# Patient Record
Sex: Male | Born: 1950 | Race: White | Hispanic: No | Marital: Married | Smoking: Former smoker
Health system: Southern US, Community
[De-identification: ages and names within clinical notes are randomized; demographics above are authoritative.]

## PROBLEM LIST (undated history)

## (undated) DIAGNOSIS — K08109 Complete loss of teeth, unspecified cause, unspecified class: Secondary | ICD-10-CM

## (undated) DIAGNOSIS — K8020 Calculus of gallbladder without cholecystitis without obstruction: Secondary | ICD-10-CM

## (undated) DIAGNOSIS — R0600 Dyspnea, unspecified: Secondary | ICD-10-CM

## (undated) DIAGNOSIS — R0689 Other abnormalities of breathing: Secondary | ICD-10-CM

## (undated) DIAGNOSIS — R053 Chronic cough: Secondary | ICD-10-CM

## (undated) DIAGNOSIS — Z973 Presence of spectacles and contact lenses: Secondary | ICD-10-CM

## (undated) DIAGNOSIS — E785 Hyperlipidemia, unspecified: Secondary | ICD-10-CM

## (undated) DIAGNOSIS — G4733 Obstructive sleep apnea (adult) (pediatric): Secondary | ICD-10-CM

## (undated) DIAGNOSIS — Z8616 Personal history of COVID-19: Secondary | ICD-10-CM

## (undated) DIAGNOSIS — J449 Chronic obstructive pulmonary disease, unspecified: Secondary | ICD-10-CM

## (undated) DIAGNOSIS — J849 Interstitial pulmonary disease, unspecified: Secondary | ICD-10-CM

## (undated) DIAGNOSIS — I10 Essential (primary) hypertension: Secondary | ICD-10-CM

## (undated) DIAGNOSIS — R911 Solitary pulmonary nodule: Secondary | ICD-10-CM

## (undated) DIAGNOSIS — E119 Type 2 diabetes mellitus without complications: Secondary | ICD-10-CM

## (undated) DIAGNOSIS — K219 Gastro-esophageal reflux disease without esophagitis: Secondary | ICD-10-CM

## (undated) DIAGNOSIS — R011 Cardiac murmur, unspecified: Secondary | ICD-10-CM

## (undated) DIAGNOSIS — G629 Polyneuropathy, unspecified: Secondary | ICD-10-CM

## (undated) DIAGNOSIS — M1990 Unspecified osteoarthritis, unspecified site: Secondary | ICD-10-CM

## (undated) DIAGNOSIS — R05 Cough: Secondary | ICD-10-CM

## (undated) DIAGNOSIS — K449 Diaphragmatic hernia without obstruction or gangrene: Secondary | ICD-10-CM

## (undated) HISTORY — PX: LAPAROSCOPIC ASSISTED VENTRAL HERNIA REPAIR: SHX6312

## (undated) HISTORY — PX: COLONOSCOPY: SHX174

## (undated) HISTORY — DX: Hyperlipidemia, unspecified: E78.5

## (undated) HISTORY — DX: Type 2 diabetes mellitus without complications: E11.9

## (undated) HISTORY — PX: TONSILLECTOMY: SUR1361

## (undated) HISTORY — DX: Essential (primary) hypertension: I10

## (undated) HISTORY — PX: VASECTOMY: SHX75

## (undated) HISTORY — PX: UMBILICAL HERNIA REPAIR: SHX196

## (undated) HISTORY — PX: HERNIA REPAIR: SHX51

---
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
## 2000-10-06 ENCOUNTER — Encounter: Admission: RE | Admit: 2000-10-06 | Discharge: 2000-10-06 | Payer: Self-pay | Admitting: Family Medicine

## 2004-09-26 ENCOUNTER — Ambulatory Visit (HOSPITAL_COMMUNITY): Admission: RE | Admit: 2004-09-26 | Discharge: 2004-09-26 | Payer: Self-pay | Admitting: General Surgery

## 2006-09-28 ENCOUNTER — Inpatient Hospital Stay (HOSPITAL_COMMUNITY): Admission: EM | Admit: 2006-09-28 | Discharge: 2006-10-01 | Payer: Self-pay | Admitting: *Deleted

## 2006-12-07 ENCOUNTER — Emergency Department (HOSPITAL_COMMUNITY): Admission: EM | Admit: 2006-12-07 | Discharge: 2006-12-07 | Payer: Self-pay | Admitting: Emergency Medicine

## 2008-04-09 ENCOUNTER — Encounter: Admission: RE | Admit: 2008-04-09 | Discharge: 2008-04-09 | Payer: Self-pay | Admitting: Family Medicine

## 2008-08-28 ENCOUNTER — Emergency Department (HOSPITAL_COMMUNITY): Admission: EM | Admit: 2008-08-28 | Discharge: 2008-08-28 | Payer: Self-pay | Admitting: Emergency Medicine

## 2008-12-06 ENCOUNTER — Inpatient Hospital Stay (HOSPITAL_COMMUNITY): Admission: RE | Admit: 2008-12-06 | Discharge: 2008-12-09 | Payer: Self-pay | Admitting: General Surgery

## 2008-12-12 ENCOUNTER — Encounter: Payer: Self-pay | Admitting: Emergency Medicine

## 2008-12-12 ENCOUNTER — Inpatient Hospital Stay (HOSPITAL_COMMUNITY): Admission: EM | Admit: 2008-12-12 | Discharge: 2008-12-17 | Payer: Self-pay | Admitting: General Surgery

## 2008-12-13 ENCOUNTER — Ambulatory Visit: Payer: Self-pay | Admitting: Vascular Surgery

## 2008-12-13 ENCOUNTER — Encounter (INDEPENDENT_AMBULATORY_CARE_PROVIDER_SITE_OTHER): Payer: Self-pay | Admitting: General Surgery

## 2009-02-12 ENCOUNTER — Encounter: Admission: RE | Admit: 2009-02-12 | Discharge: 2009-02-12 | Payer: Self-pay | Admitting: General Surgery

## 2010-04-08 LAB — CBC
HCT: 36.2 % — ABNORMAL LOW (ref 39.0–52.0)
HCT: 37.1 % — ABNORMAL LOW (ref 39.0–52.0)
Hemoglobin: 12.3 g/dL — ABNORMAL LOW (ref 13.0–17.0)
MCHC: 32.9 g/dL (ref 30.0–36.0)
MCHC: 33.6 g/dL (ref 30.0–36.0)
MCHC: 34 g/dL (ref 30.0–36.0)
MCV: 85.7 fL (ref 78.0–100.0)
MCV: 85.9 fL (ref 78.0–100.0)
Platelets: 306 10*3/uL (ref 150–400)
Platelets: 329 10*3/uL (ref 150–400)
Platelets: 350 10*3/uL (ref 150–400)
RBC: 4.36 MIL/uL (ref 4.22–5.81)
RBC: 4.38 MIL/uL (ref 4.22–5.81)
RDW: 15.4 % (ref 11.5–15.5)
RDW: 15.6 % — ABNORMAL HIGH (ref 11.5–15.5)
WBC: 11 10*3/uL — ABNORMAL HIGH (ref 4.0–10.5)
WBC: 8.8 10*3/uL (ref 4.0–10.5)

## 2010-04-08 LAB — COMPREHENSIVE METABOLIC PANEL
Albumin: 2.6 g/dL — ABNORMAL LOW (ref 3.5–5.2)
Alkaline Phosphatase: 35 U/L — ABNORMAL LOW (ref 39–117)
Alkaline Phosphatase: 37 U/L — ABNORMAL LOW (ref 39–117)
BUN: 8 mg/dL (ref 6–23)
CO2: 28 mEq/L (ref 19–32)
Calcium: 8.1 mg/dL — ABNORMAL LOW (ref 8.4–10.5)
Calcium: 8.1 mg/dL — ABNORMAL LOW (ref 8.4–10.5)
Chloride: 101 mEq/L (ref 96–112)
Creatinine, Ser: 0.79 mg/dL (ref 0.4–1.5)
Creatinine, Ser: 0.84 mg/dL (ref 0.4–1.5)
GFR calc Af Amer: >60 mL/min (ref >60)
GFR calc non Af Amer: >60 mL/min (ref >60)
Glucose, Bld: 108 mg/dL — ABNORMAL HIGH (ref 70–99)
Potassium: 3.8 mEq/L (ref 3.5–5.1)
Sodium: 137 mEq/L (ref 135–145)

## 2010-04-08 LAB — BASIC METABOLIC PANEL
Creatinine, Ser: 0.78 mg/dL (ref 0.4–1.5)
GFR calc Af Amer: >60 mL/min (ref >60)
Glucose, Bld: 134 mg/dL — ABNORMAL HIGH (ref 70–99)

## 2010-04-08 LAB — DIFFERENTIAL
Basophils Absolute: 0 10*3/uL (ref 0.0–0.1)
Basophils Relative: 0 % (ref 0–1)
Lymphs Abs: 1.7 10*3/uL (ref 0.7–4.0)
Monocytes Absolute: 0.7 10*3/uL (ref 0.1–1.0)
Monocytes Relative: 7 % (ref 3–12)

## 2010-04-09 LAB — COMPREHENSIVE METABOLIC PANEL
ALT: 27 U/L (ref 0–53)
AST: 25 U/L (ref 0–37)
Alkaline Phosphatase: 43 U/L (ref 39–117)
Chloride: 108 mEq/L (ref 96–112)
GFR calc Af Amer: >60 mL/min (ref >60)
Glucose, Bld: 145 mg/dL — ABNORMAL HIGH (ref 70–99)
Total Protein: 6.6 g/dL (ref 6.0–8.3)

## 2010-04-09 LAB — DIFFERENTIAL
Basophils Absolute: 0.1 10*3/uL (ref 0.0–0.1)
Basophils Relative: 1 % (ref 0–1)
Eosinophils Absolute: 0.3 10*3/uL (ref 0.0–0.7)
Eosinophils Relative: 5 % (ref 0–5)
Lymphocytes Relative: 30 % (ref 12–46)
Lymphs Abs: 2 10*3/uL (ref 0.7–4.0)
Monocytes Absolute: 0.6 10*3/uL (ref 0.1–1.0)
Monocytes Relative: 9 % (ref 3–12)
Neutro Abs: 3.7 10*3/uL (ref 1.7–7.7)
Neutrophils Relative %: 55 % (ref 43–77)

## 2010-04-09 LAB — CBC
HCT: 37.7 % — ABNORMAL LOW (ref 39.0–52.0)
Hemoglobin: 12.8 g/dL — ABNORMAL LOW (ref 13.0–17.0)
MCHC: 33.8 g/dL (ref 30.0–36.0)
MCV: 84.9 fL (ref 78.0–100.0)
Platelets: 253 10*3/uL (ref 150–400)
RBC: 4.44 MIL/uL (ref 4.22–5.81)
RDW: 15.6 % — ABNORMAL HIGH (ref 11.5–15.5)
WBC: 6.8 10*3/uL (ref 4.0–10.5)

## 2010-04-12 LAB — DIFFERENTIAL
Basophils Relative: 1 % (ref 0–1)
Eosinophils Absolute: 0.4 10*3/uL (ref 0.0–0.7)
Monocytes Absolute: 1 10*3/uL (ref 0.1–1.0)
Monocytes Relative: 9 % (ref 3–12)
Neutrophils Relative %: 63 % (ref 43–77)

## 2010-04-12 LAB — COMPREHENSIVE METABOLIC PANEL
ALT: 23 U/L (ref 0–53)
Alkaline Phosphatase: 47 U/L (ref 39–117)
Glucose, Bld: 97 mg/dL (ref 70–99)
Potassium: 3.9 mEq/L (ref 3.5–5.1)
Sodium: 139 mEq/L (ref 135–145)
Total Protein: 6.9 g/dL (ref 6.0–8.3)

## 2010-04-12 LAB — URINALYSIS, ROUTINE W REFLEX MICROSCOPIC
Glucose, UA: NEGATIVE mg/dL
Ketones, ur: NEGATIVE mg/dL
Nitrite: NEGATIVE
Urobilinogen, UA: 0.2 mg/dL (ref 0.0–1.0)
pH: 5.5 (ref 5.0–8.0)

## 2010-04-12 LAB — CBC
Hemoglobin: 13.5 g/dL (ref 13.0–17.0)
RBC: 4.71 MIL/uL (ref 4.22–5.81)
RDW: 15.2 % (ref 11.5–15.5)

## 2010-04-12 LAB — URINE MICROSCOPIC-ADD ON

## 2010-05-20 NOTE — Op Note (Signed)
[name] [name], ENGEN             ACCOUNT NO.:  [id number]   MEDICAL RECORD NO.:  [id number]          PATIENT TYPE:  INP   LOCATION:  0098                         FACILITY:  [facility]   PHYSICIAN:  Sandria Bales. Ezzard Standing, M.D.  DATE OF BIRTH:  09/08/1950   DATE OF PROCEDURE:  09/28/2006  DATE OF DISCHARGE:                               OPERATIVE REPORT   PREOPERATIVE DIAGNOSIS:  Incarcerated ventral hernia.   POSTOPERATIVE DIAGNOSIS:  Incarcerated recurrent ventral hernia with  approximate 6-cm defect.   PROCEDURE:  Laparoscopic ventral hernia repair with a 20 x 15 cm  Parietex mesh.   SURGEON:  Sandria Bales. Ezzard Standing, M.D.   ANESTHESIA:  General endotracheal with about 20 mL of local 0.25%  Marcaine.   COMPLICATIONS:  None.   INDICATIONS FOR PROCEDURE:  Mr. Swingle  is a morbidly obese 60-year-  old white male who is a patient of Dr. Alwyn Pea, who has an  estimated BMI of 62 to 63.  He had a umbilical hernia repaired by Dr. Megan Mans in September 2006.  Over the last three to four months he has had  some vague abdominal pain but then presented today acutely with  abdominal pain, has what appears to be incarcerated ventral hernia.   I discussed with the patient and his wife the indications and potential  complications of surgery.  Potential complications include, but not  limited to, bleeding, infection, recurrence of the hernia, bowel  resection and I stressed to him that is important that he lose weight.  If he continues to have the weight that he has, he has a very high risk  of this hernia recurring again, no matter how it was repaired.   OPERATIVE NOTE:  The patient placed in a supine position, given a  general anesthetic.  He had a gram of Ancef at initiation of the  procedure.  He had PAS stockings placed and Foley catheter in place and  a time-out was done again identifying the patient and the procedure.   I accessed the abdominal cavity with a 10-mm Ethicon OptiVu in the  left  upper quadrant.  I placed three additional 5 mm trocars; a 5-mm left  lower quadrant, 5 mm left subcostal and 5 mm in the right lateral  abdomen.   I then was able to get up and identify the hernia which I was able to  reduce.  He had a wad of omentum incarecerated in the hernia.  I used  the Harmonic scalpel to take the omentum down.   I then found the defect.  I used a spinal needle to outline the edge of  the defect which about 6 cm in diameter and round.  I could see the  bottom lip which looked like the mesh and repair that Dr. Lindie Spruce had  done.   I then got a 15 x 20 cm piece of Parietex and placed eight sutures  around the edge of the Parietex and inserted this into abdominal cavity  and then brought this up to the anterior abdominal wall by making  puncture wounds and a clockwise fashion  to pull out the eight sutures.  I then used a tacker which I used about approximately 40 tacks to tack  this up to the anterior abdominal wall.  The mesh lay flat covered  the  abdominal hernia well, I thought I had at least 3-4 cm of purchase  around the edges of the mesh.   I did have one suture that may have broken at the 1:30 position of the  mesh and I used a #1 Novofil sutures through the mesh to retrieve and  grab that.   The mesh was then looked like it lay flat.  I took photos which I put in  the chart.  I removed trocars, I closed the trocar sites with a 5-0  Monocryl suture.  I painted each wound with tincture of benzoin and  steri-stripped.  The patient procedure well, was transported to recovery  room in good condition.  Sponge and needle count correct at end of the  case.      Sandria Bales. Ezzard Standing, M.D.  Electronically Signed     DHN/MEDQ  D:  09/28/2006  T:  09/29/2006  Job:  [id number]   cc:   Tanya D. Daphine Deutscher, M.D.  Fax: [phone]   Cherylynn Ridges, M.D.  [address], [address]  [place]  [place] [address]

## 2010-05-20 NOTE — H&P (Signed)
[name], [name]             ACCOUNT NO.:  [id number]   MEDICAL RECORD NO.:  [id number]          PATIENT TYPE:  EMS   LOCATION:  ED                           FACILITY:  [facility]   PHYSICIAN:  Sandria Bales. Ezzard Standing, M.D.  DATE OF BIRTH:  [birth year]   DATE OF ADMISSION:  09/28/2006  DATE OF DISCHARGE:                              HISTORY & PHYSICAL   HISTORY OF PRESENT ILLNESS:  This is a 60 year old white male who is a  patient of Dr. Alwyn [name],  who on September 26, 2004, two years ago,  underwent repair of a periumbilical ventral hernia with mesh and had a  partial omentectomy.  This was an elective operation.  Dr. Lindie [name] did the  surgery and found the an approximately 3.5 to 4 centimeter opening of  the abdominal fascia.  He repaired it with interrupted #1 Novofil and  then over laid a piece of mesh which measured 3 x 5 centimeters  attaching it with #1 Novofil.   The patient had done well until maybe two to three months ago when he  said he had noticed type of pain or discomfort in his epigastrium  however, he did not see Dr. Lindie [name] back nor reviewed this with any  medical physician.  At about 10 o'clock this morning he had a sort of  sudden onset of abdominal pain.  I think he saw Dr. Daphine [name] in her office  though I have not spoken to Dr. Daphine [name].  He was sent to the [facility]  Emergency Room.   He has had no prior history of peptic ulcer disease, liver disease,  gallbladder disease, colon disease or bladder disease.  He however is  morbidly obese.  He estimates his weight at 325 pounds.  His height is 5  feet, 10 inches and this makes a calculated BMI of about 62.   PAST MEDICAL HISTORY:  He has no allergies. The only medicine he takes  is multivitamins.   REVIEW OF SYSTEMS:  Neurologic: No seizures or loss of consciousness.  Pulmonary: He quit smoking two or three years ago.  Cardiac: He has no heart disease, chest pain or hypertension.  He has  never had a cardiac  catheterization.  Gastrointestinal:  See history of present illness.  Urologic: No history of kidney stones or kidney infections.   He is accompanied by his wife in the emergency room.  He has been  unemployed for a year;  before that he worked in [employer].   PHYSICAL EXAMINATION:  His temperature is 97.3, his blood pressure  154/96, pulse 76, respirations 20.  He is a well-nourished morbidly obese white male alert and cooperative  on physical examination.  HEENT:  Unremarkable.  He has a stout neck but I feel no thyromegaly or  mass.  His heart has a regular rate and rhythm.  I do not hear a murmur.  ABDOMEN: he has an approximate 10-12 centimeter mass consistent with an  incarcerated abdominal wall hernia.  He is a very large man which makes  it reducing almost impractical.  He has no other localized tenderness  just tenderness over the hernia itself.  I feel no other mass, though  his size limits the physical exam.  His lungs are clear to auscultation.  His heart has a regular rate and  rhythm without murmur or rub.  EXTREMITIES:  He has good strength in all four extremities.  NEUROLOGICALLY:  Grossly intact.   I have no labs at the time of dictation.   IMPRESSION:  1. Recurrent ventral hernia.  I think it is incarcerated and there is      not going to be a practical way to reduce this outside of general      anesthesia in surgery.  I discussed with him hernia repair as he      has already been through.  We talked about putting mesh in and the      possibility that he has got an infection, I can not put mesh in.      The risks for surgery include, but are not limited to, bleeding,      infection, recurrence of a hernia.  He understands well that because of his weight he is at very high risk  for recurrent hernias if he does not lose a substantial amount of weight  and I went over this with him.  He said he and his wife are trying to do  things to lose weight.  1.  Morbid obesity with approximately a BMI of 62 to 63.  2. History of smoking but he quit about three years ago.      Sandria Bales. Ezzard Standing, M.D.  Electronically Signed     DHN/MEDQ  D:  09/28/2006  T:  09/28/2006  Job:  [id number]   cc:   Tanya D. [name] [name], M.D.  Fax: [phone]   Cherylynn Ridges, M.D.  [address], [address]  [place]  [place] [address]

## 2010-05-23 NOTE — Discharge Summary (Signed)
[name] [name], [name] [name]             ACCOUNT NO.:  [id number]   MEDICAL RECORD NO.:  [id number]          PATIENT TYPE:  INP   LOCATION:  1337                         FACILITY:  [facility]   PHYSICIAN:  Sandria Bales. Ezzard Standing, M.D.  DATE OF BIRTH:  [birth year]   DATE OF ADMISSION:  09/28/2006  DATE OF DISCHARGE:  10/01/2006                               DISCHARGE SUMMARY   DISCHARGE DIAGNOSES:  1. Incarcerated abdominal wall hernia.  2. Smoking.  3. He has morbid obesity with a BMI approximately 62 to 63.   OPERATION:  Laparoscopic repair of incarcerated ventral hernia with Parietex mesh   HISTORY OF ILLNESS:  This is a 60 year old white male who is a patient  of Dr. Alwyn Pea had undergone an open abdominal wall repair of a  hernia by Dr. Frederik Schmidt in September 2006.  He had on and off abdominal  pain for 2 or 3 months before presently acutely when he began this  morning with fairly severe pain he came to the [facility] Emergency  Room.  He was identified as having an incarcerated abdominal hernia.  I  discussed with him about repairing this hernia at this time.   Of note, he is significantly overweight with a BMI 62.  He says to me  his weight is 325.  His height is 5 feet 10 inches which makes him a  calculated BMI of about 62.  He understands well that his weight is a  predominant factor in his recurrent hernia and even this repair may not  hold up if he continues to maintain or gain weight from where he is  right now.  He also smokes cigarettes.  I told him this is bad for  health.   On the day of admission, he was sent to the operating room where he  underwent a laparoscopic repair of an incarcerated ventral hernia.  I  used a piece of 20 x 15 Parietex mesh.   Postoperatively, he did well.  His temperature was 97.3 the 1st  postoperative day.  Pulse 76.  His abdomen was quiet.  By the 2nd postoperative day, he started  increasing diet.  Upon the 3rd day, he was doing well enough  to be fairly comfortable and  be ready for discharge.   DISCHARGE INSTRUCTIONS:  Included a regular diet.  He could shower.  On discharge, he is to wear an abdominal binder for 1 month while he is  up and moving.  He should not drive for 3 or 4 days.  He is to see me back in 2 to 3 weeks.  He was given Vicodin for pain and he knew to call for interval problem.      Sandria Bales. Ezzard Standing, M.D.  Electronically Signed     DHN/MEDQ  D:  10/27/2006  T:  10/28/2006  Job:  [id number]   cc:   Tanya D. Daphine Deutscher, M.D.  Fax: [phone]

## 2010-05-23 NOTE — Op Note (Signed)
[name] [name], [name] [name]             ACCOUNT NO.:  [id number]   MEDICAL RECORD NO.:  [id number]          PATIENT TYPE:  AMB   LOCATION:  SDS                          FACILITY:  [facility]   PHYSICIAN:  Cherylynn Ridges, M.D.    DATE OF BIRTH:  [birth year]   DATE OF PROCEDURE:  09/26/2004  DATE OF DISCHARGE:                                 OPERATIVE REPORT   PREOPERATIVE DIAGNOSIS:  Periumbilical ventral hernia.   POSTOPERATIVE DIAGNOSIS:  Periumbilical ventral hernia with incarcerated  omentum.   PROCEDURE:  1.  Repair of periumbilical ventral hernia with mesh.  2.  Partial omentectomy.   SURGEON:  Jimmye Norman, M.D.   ANESTHESIA:  General endotracheal anesthesia.   ESTIMATED BLOOD LOSS:  Less than 20 mL.   COMPLICATIONS:  None.   CONDITION:  Good.   INDICATIONS FOR PROCEDURE:  The patient is a 60 year old gentleman with  symptomatic periumbilical ventral hernia who comes in now for repair.   FINDINGS:  The patient had a large amount of incarcerated omentum which was  more easily resected than placed back into the peritoneal cavity.  Part of  it did get traumatized from being incarcerated in the hernia.   OPERATION:  The patient was taken to the operating room and placed on the  table in supine position.  After an adequate endotracheal anesthetic was  administered, he was prepped and draped in the usual sterile manner exposing  the midline and the periumbilical area.  We made an infraumbilical  curvilinear incision approximately 8 cm long below the umbilicus.  It was  taken down into the subcu where we immediately came across the hernia sac.  Using appropriate retraction, we dissected out the hernia sac to its fascial  edges.  At that point, we did open the hernia sac and found there to be  incarcerated omentum.  We partially resected some of this omentum, although  we were able to remove it from the hernia sac and allow it to fall back into  the peritoneal cavity.  We ligated the  omental vessels using 2-0 Vicryl  ties.  Once the omentum had fallen back into the peritoneal cavity, we were  able to see the fascial edges appropriately.  The opening was about 3.5 to 4  cm in size.  We resected the hernia sac completely and then we repaired the  hernia.  The initial repair was with interrupted simple stitches of #1  Novofil.  Approximately nine of these were used to close the defect.  We  then overlaid a piece of mesh measuring approximately 5 by 3 cm in size  overlying it over the primary repair and attaching it with the #1 Novofil  suture.  We soaked the mesh with antibiotic solution and then irrigated with  antibiotic solution prior to closure.   We closed in multiple layers, deep 2-0 Vicryl layer directly on top of the  mesh repair.  We then did a subcuticular stitch of 3-0 Vicryl inverting the  periumbilical area which had been concave prior to the procedure.  Then, we  closed the skin using a running  subcuticular stitch of 4-0 Monopril.  We  injected the incisional area with 0.5% Marcaine without epinephrine prior to  closure.  A sterile dressing was applied.  All counts were correct.      Cherylynn Ridges, M.D.  Electronically Signed     JOW/MEDQ  D:  09/26/2004  T:  09/26/2004  Job:  [id number]   cc:   Tanya D. Daphine Deutscher, M.D.  Fax: [phone]

## 2010-10-16 LAB — COMPREHENSIVE METABOLIC PANEL
AST: 24
Albumin: 3.6
Calcium: 9.2
Creatinine, Ser: 0.95
GFR calc Af Amer: >60

## 2010-10-16 LAB — CBC
MCHC: 34.7
MCV: 82.7
Platelets: 384
RDW: 15 — ABNORMAL HIGH

## 2012-11-29 DIAGNOSIS — M720 Palmar fascial fibromatosis [Dupuytren]: Secondary | ICD-10-CM | POA: Insufficient documentation

## 2012-12-16 DIAGNOSIS — E785 Hyperlipidemia, unspecified: Secondary | ICD-10-CM | POA: Insufficient documentation

## 2013-02-16 DIAGNOSIS — I10 Essential (primary) hypertension: Secondary | ICD-10-CM | POA: Insufficient documentation

## 2013-02-24 DIAGNOSIS — Z87891 Personal history of nicotine dependence: Secondary | ICD-10-CM | POA: Insufficient documentation

## 2013-10-27 DIAGNOSIS — E119 Type 2 diabetes mellitus without complications: Secondary | ICD-10-CM | POA: Insufficient documentation

## 2015-02-14 ENCOUNTER — Encounter: Payer: Self-pay | Admitting: Gastroenterology

## 2015-02-14 ENCOUNTER — Telehealth: Payer: Self-pay

## 2015-02-14 NOTE — Telephone Encounter (Signed)
Pt has a BMI of 51.54. Do you want pt to have a direct hospital colonoscopy or an OV prior to colonoscopy? Thanks Daisi Kentner-PV

## 2015-02-15 NOTE — Telephone Encounter (Signed)
Called home number. Instructed by gentleman this is the incorrect number for this patient. Called mobile number, it states the mail box is not set up and you cannot leave a message .  Called work number and was [employer] and was told this man has retired and no longer works there.   Lelan Pons PV

## 2015-02-15 NOTE — Telephone Encounter (Signed)
There is no clinical info in Epic, so it would be best to bring this patient in for a visit with the APP to discuss CRC screening options.

## 2015-02-15 NOTE — Telephone Encounter (Signed)
Attempted call 445 pm. Would not let me LM as stated below. Lelan Pons PV

## 2015-02-19 NOTE — Telephone Encounter (Signed)
Attempted to call patient x 3. No answer the first time. Second attempt a woman answered but hung up when asked for patient. Third attempt woman answered. I identified myself and she stated that we had the wrong number.

## 2015-02-20 ENCOUNTER — Ambulatory Visit (AMBULATORY_SURGERY_CENTER): Payer: Self-pay | Admitting: *Deleted

## 2015-02-20 ENCOUNTER — Encounter: Payer: Self-pay | Admitting: Gastroenterology

## 2015-02-20 VITALS — Ht 69.5 in | Wt 347.4 lb

## 2015-02-20 DIAGNOSIS — Z1211 Encounter for screening for malignant neoplasm of colon: Secondary | ICD-10-CM

## 2015-02-20 NOTE — Progress Notes (Signed)
Patient denies any allergies to eggs or soy. Patient denies any problems with anesthesia/sedation. Patient denies any oxygen use at home and does not take any diet/weight loss medications. Per phone note by MD, Colonoscopy cancelled and office visit made on April 11 8:45 am. Pt aware.

## 2015-03-06 ENCOUNTER — Encounter: Payer: Self-pay | Admitting: Gastroenterology

## 2015-04-16 ENCOUNTER — Ambulatory Visit: Payer: 59 | Admitting: Gastroenterology

## 2015-11-27 ENCOUNTER — Encounter: Payer: Self-pay | Admitting: Gastroenterology

## 2016-01-21 ENCOUNTER — Ambulatory Visit (INDEPENDENT_AMBULATORY_CARE_PROVIDER_SITE_OTHER): Payer: Medicare Other | Admitting: Gastroenterology

## 2016-01-21 ENCOUNTER — Encounter (INDEPENDENT_AMBULATORY_CARE_PROVIDER_SITE_OTHER): Payer: Self-pay

## 2016-01-21 ENCOUNTER — Encounter: Payer: Self-pay | Admitting: Gastroenterology

## 2016-01-21 VITALS — BP 160/90 | HR 80 | Ht 68.0 in | Wt 320.5 lb

## 2016-01-21 DIAGNOSIS — Z1211 Encounter for screening for malignant neoplasm of colon: Secondary | ICD-10-CM | POA: Diagnosis not present

## 2016-01-21 MED ORDER — NA SULFATE-K SULFATE-MG SULF 17.5-3.13-1.6 GM/177ML PO SOLN: 1.0000 | Freq: Once | ORAL | 0 refills | Status: AC

## 2016-01-21 NOTE — Progress Notes (Signed)
[facility] Consult Note:  History: [name] [name] 01/21/2016  Referring physician: Berkley Harvey, NP  Reason for consult/chief complaint: Colon Cancer Screening   Subjective  HPI:  Reports last colonoscopy 12-15 yrs ago. Denies abd pain, rectal bleeding or change in bowel habits. Brought for OV b/c BMI.   ROS:  Review of Systems  Denies chest pain or dyspnea Hast purposely brought down weight 27 pounds in last year. BMI now 48.7  Past Medical History: Past Medical History:  Diagnosis Date  . Diabetes mellitus without complication ([place])   . Hyperlipidemia   . Hypertension      Past Surgical History: Past Surgical History:  Procedure Laterality Date  . COLONOSCOPY  at age [age] yrs ago   pt cannot remember place. exam normal per pt.   Marland Kitchen HERNIA REPAIR    . TONSILLECTOMY    . UMBILICAL HERNIA REPAIR    . VASECTOMY       Family History: Family History  Problem Relation Age of Onset  . Cancer Mother     ? type  . Heart disease Father   . Heart disease Brother   . Colon cancer Neg Hx     Social History: Social History   Social History  . Marital status: Married    Spouse name: N/A  . Number of children: 2  . Years of education: N/A   Occupational History  . retired    Social History Main Topics  . Smoking status: Former Smoker    Quit date: 01/05/2002  . Smokeless tobacco: Never Used  . Alcohol use No  . Drug use: No  . Sexual activity: Not Asked   Other Topics Concern  . None   Social History Narrative  . None    Allergies: Allergies  Allergen Reactions  . Mercury Swelling    Outpatient Meds: Current Outpatient Prescriptions  Medication Sig Dispense Refill  . albuterol (PROVENTIL HFA;VENTOLIN HFA) 108 (90 Base) MCG/ACT inhaler Inhale 1 puff into the lungs as needed.    Marland Kitchen aspirin EC 81 MG tablet Take 1 tablet by mouth daily.    Marland Kitchen atorvastatin (LIPITOR) 20 MG tablet Take 20 mg by mouth daily.    . calcium-vitamin  D (CALCIUM 500/D) 500-200 MG-UNIT tablet Take 1 tablet by mouth daily.    . Lancets (ACCU-CHEK MULTICLIX) lancets     . losartan-hydrochlorothiazide (HYZAAR) 50-12.5 MG tablet Take 1 tablet by mouth daily.    . metFORMIN (GLUCOPHAGE) 500 MG tablet Take 1 tablet by mouth daily.    . Multiple Vitamin (MULTI-VITAMINS) TABS Take 1 tablet by mouth daily.    . Omega-3 Fatty Acids (SM FISH OIL) 1000 MG CAPS Take 1 capsule by mouth 2 (two) times daily.     No current facility-administered medications for this visit.       ___________________________________________________________________ Objective   Exam:  BP (!) 160/90 (BP Location: Left Arm, Patient Position: Sitting, Cuff Size: Large)   Pulse 80   Ht 5\' 8"  (1.727 m) Comment: height measured without shoes  Wt (!) 320 lb 8 oz (145.4 kg)   BMI 48.73 kg/m    General: this is a(n) morbidly obese man, no acute distress   Eyes: sclera anicteric, no redness  ENT: oral mucosa moist without lesions, no cervical or supraclavicular lymphadenopathy, good dentition  CV: RRR without murmur, S1/S2, no JVD, no peripheral edema  Resp: clear to auscultation bilaterally, normal RR and effort noted  GI: soft, no tenderness, with active bowel sounds.  No guarding or palpable organomegaly noted.  Skin; warm and dry, no rash or jaundice noted  Neuro: awake, alert and oriented x 3. Normal gross motor function and fluent speech  Labs:  none  Radiologic Studies:  none  Assessment: Encounter Diagnoses  Name Primary?  . Special screening for malignant neoplasms, colon Yes  . Morbid obesity ([place])     Screening colon in [place].  Thank you for the courtesy of this consult.  Please call me with any questions or concerns.  Nelida Meuse III  CC: Berkley Harvey, NP

## 2016-01-21 NOTE — Patient Instructions (Signed)
If you are age 66 or older, your body mass index should be between 23-30. Your Body mass index is 48.73 kg/m. If this is out of the aforementioned range listed, please consider follow up with your Primary Care Provider.  If you are age 100 or younger, your body mass index should be between 19-25. Your Body mass index is 48.73 kg/m. If this is out of the aformentioned range listed, please consider follow up with your Primary Care Provider.   You have been scheduled for a colonoscopy. Please follow written instructions given to you at your visit today.  Please pick up your prep supplies at the pharmacy within the next 1-3 days. If you use inhalers (even only as needed), please bring them with you on the day of your procedure. Your physician has requested that you go to www.startemmi.com and enter the access code given to you at your visit today. This web site gives a general overview about your procedure. However, you should still follow specific instructions given to you by our office regarding your preparation for the procedure.  Thank you for choosing [facility]  Dr Wilfrid Lund III

## 2016-02-07 ENCOUNTER — Ambulatory Visit (AMBULATORY_SURGERY_CENTER): Payer: Medicare Other | Admitting: Gastroenterology

## 2016-02-07 ENCOUNTER — Encounter: Payer: Self-pay | Admitting: Gastroenterology

## 2016-02-07 VITALS — BP 113/72 | HR 67 | Temp 98.0°F | Resp 16 | Ht 68.0 in | Wt 320.0 lb

## 2016-02-07 DIAGNOSIS — Z1212 Encounter for screening for malignant neoplasm of rectum: Secondary | ICD-10-CM | POA: Diagnosis not present

## 2016-02-07 DIAGNOSIS — Z1211 Encounter for screening for malignant neoplasm of colon: Secondary | ICD-10-CM | POA: Diagnosis not present

## 2016-02-07 DIAGNOSIS — D123 Benign neoplasm of transverse colon: Secondary | ICD-10-CM

## 2016-02-07 LAB — GLUCOSE, CAPILLARY
Glucose-Capillary: 128 mg/dL — ABNORMAL HIGH (ref 65–99)
Glucose-Capillary: 128 mg/dL — ABNORMAL HIGH (ref 65–99)

## 2016-02-07 MED ORDER — SODIUM CHLORIDE 0.9 % IV SOLN: 500.0000 mL | INTRAVENOUS | Status: DC

## 2016-02-07 NOTE — Progress Notes (Signed)
To recovery, report to Hodges, RN, VSS 

## 2016-02-07 NOTE — Progress Notes (Signed)
Called to room to assist during endoscopic procedure.  Patient ID and intended procedure confirmed with present staff. Received instructions for my participation in the procedure from the performing physician.  

## 2016-02-07 NOTE — Op Note (Signed)
[place] Patient Name: [name] [name] Procedure Date: 02/07/2016 10:10 AM MRN: [id number] Endoscopist: [place]. Loletha Carrow , MD Age: 66 Referring MD:  Date of Birth: [birth year] Gender: Male Account #: [id number] Procedure:                Colonoscopy Indications:              Screening for colorectal malignant neoplasm                            (patient reports a normal colonoscopy 15 yrs ago) Medicines:                Monitored Anesthesia Care Procedure:                Pre-Anesthesia Assessment:                           - Prior to the procedure, a History and Physical                            was performed, and patient medications and                            allergies were reviewed. The patient's tolerance of                            previous anesthesia was also reviewed. The risks                            and benefits of the procedure and the sedation                            options and risks were discussed with the patient.                            All questions were answered, and informed consent                            was obtained. Anticoagulants: The patient has taken                            aspirin. It was decided not to withhold this                            medication prior to the procedure. ASA Grade                            Assessment: II - A patient with mild systemic                            disease. After reviewing the risks and benefits,                            the patient was deemed in satisfactory condition to  undergo the procedure.                           After obtaining informed consent, the colonoscope                            was passed under direct vision. Throughout the                            procedure, the patient's blood pressure, pulse, and                            oxygen saturations were monitored continuously. The                            Model CF-HQ190L [id number]) scope was  introduced                            through the anus and advanced to the the cecum,                            identified by appendiceal orifice and ileocecal                            valve. The colonoscopy was performed without                            difficulty. The patient tolerated the procedure                            well. The quality of the bowel preparation was                            good. The ileocecal valve, appendiceal orifice, and                            rectum were photographed. The quality of the bowel                            preparation was evaluated using the BBPS [facility]                            Bowel Preparation Scale) with scores of: Right                            Colon = 2, Transverse Colon = 2 and Left Colon = 2.                            The total BBPS score equals 6. The bowel                            preparation used was SUPREP. Scope In: 10:26:53 AM Scope Out: 10:44:52 AM Scope Withdrawal Time: 0 hours 12 minutes 29 seconds  Total Procedure Duration: 0 hours 17 minutes 59 seconds  Findings:                 The perianal and digital rectal examinations were                            normal.                           A 4 mm polyp was found in the distal transverse                            colon. The polyp was semi-sessile. The polyp was                            removed with a cold snare. Resection and retrieval                            were complete.                           Internal hemorrhoids were found during retroflexion                            and during anoscopy. The hemorrhoids were small and                            Grade I (internal hemorrhoids that do not prolapse).                           The exam was otherwise without abnormality on                            direct and retroflexion views. Complications:            No immediate complications. Estimated Blood Loss:     Estimated blood loss: none. Impression:                - One 4 mm polyp in the distal transverse colon,                            removed with a cold snare. Resected and retrieved.                           - Internal hemorrhoids.                           - The examination was otherwise normal on direct                            and retroflexion views. Recommendation:           - Patient has a contact number available for                            emergencies. The signs and symptoms of potential  delayed complications were discussed with the                            patient. Return to normal activities tomorrow.                            Written discharge instructions were provided to the                            patient.                           - Resume previous diet.                           - Continue present medications.                           - Await pathology results.                           - Repeat colonoscopy is recommended for                            surveillance. The colonoscopy date will be                            determined after pathology results from today's                            exam become available for review. Deven Furia L. Loletha Carrow, MD 02/07/2016 10:52:26 AM This report has been signed electronically.

## 2016-02-07 NOTE — Patient Instructions (Signed)
YOU HAD AN ENDOSCOPIC PROCEDURE TODAY AT THE [facility]:   Refer to the procedure report that was given to you for any specific questions about what was found during the examination.  If the procedure report does not answer your questions, please call your gastroenterologist to clarify.  If you requested that your care partner not be given the details of your procedure findings, then the procedure report has been included in a sealed envelope for you to review at your convenience later.  YOU SHOULD EXPECT: Some feelings of bloating in the abdomen. Passage of more gas than usual.  Walking can help get rid of the air that was put into your GI tract during the procedure and reduce the bloating. If you had a lower endoscopy (such as a colonoscopy or flexible sigmoidoscopy) you may notice spotting of blood in your stool or on the toilet paper. If you underwent a bowel prep for your procedure, you may not have a normal bowel movement for a few days.  Please Note:  You might notice some irritation and congestion in your nose or some drainage.  This is from the oxygen used during your procedure.  There is no need for concern and it should clear up in a day or so.  SYMPTOMS TO REPORT IMMEDIATELY:   Following lower endoscopy (colonoscopy or flexible sigmoidoscopy):  Excessive amounts of blood in the stool  Significant tenderness or worsening of abdominal pains  Swelling of the abdomen that is new, acute  Fever of 100F or higher   For urgent or emergent issues, a gastroenterologist can be reached at any hour by calling [phone].   DIET:  We do recommend a small meal at first, but then you may proceed to your regular diet.  Drink plenty of fluids but you should avoid alcoholic beverages for 24 hours.  ACTIVITY:  You should plan to take it easy for the rest of today and you should NOT DRIVE or use heavy machinery until tomorrow (because of the sedation medicines used during the test).     FOLLOW UP: Our staff will call the number listed on your records the next business day following your procedure to check on you and address any questions or concerns that you may have regarding the information given to you following your procedure. If we do not reach you, we will leave a message.  However, if you are feeling well and you are not experiencing any problems, there is no need to return our call.  We will assume that you have returned to your regular daily activities without incident.  If any biopsies were taken you will be contacted by phone or by letter within the next 1-3 weeks.  Please call us at [phone] if you have not heard about the biopsies in 3 weeks.    SIGNATURES/CONFIDENTIALITY: You and/or your care partner have signed paperwork which will be entered into your electronic medical record.  These signatures attest to the fact that that the information above on your After Visit Summary has been reviewed and is understood.  Full responsibility of the confidentiality of this discharge information lies with you and/or your care-partner.  Read all handouts given to you by your recovery room nurse. 

## 2016-02-10 ENCOUNTER — Telehealth: Payer: Self-pay

## 2016-02-10 ENCOUNTER — Encounter: Payer: Self-pay | Admitting: Podiatry

## 2016-02-10 ENCOUNTER — Ambulatory Visit (INDEPENDENT_AMBULATORY_CARE_PROVIDER_SITE_OTHER): Payer: Medicare Other | Admitting: Podiatry

## 2016-02-10 DIAGNOSIS — M79609 Pain in unspecified limb: Secondary | ICD-10-CM | POA: Diagnosis not present

## 2016-02-10 DIAGNOSIS — L603 Nail dystrophy: Secondary | ICD-10-CM | POA: Diagnosis not present

## 2016-02-10 DIAGNOSIS — E0843 Diabetes mellitus due to underlying condition with diabetic autonomic (poly)neuropathy: Secondary | ICD-10-CM

## 2016-02-10 DIAGNOSIS — L608 Other nail disorders: Secondary | ICD-10-CM

## 2016-02-10 DIAGNOSIS — B351 Tinea unguium: Secondary | ICD-10-CM

## 2016-02-10 NOTE — Telephone Encounter (Signed)
  Follow up Call-  Call back number 02/07/2016  Post procedure Call Back phone  # [phone]  Permission to leave phone message Yes  Some recent data might be hidden    Patient was called for follow up after his procedure on 02/07/2016. No answer at the number given for follow up phone call. A message was left on the answering machine.

## 2016-02-10 NOTE — Telephone Encounter (Signed)
  Follow up Call-  Call back number 02/07/2016  Post procedure Call Back phone  # [phone]  Permission to leave phone message Yes  Some recent data might be hidden    Patient was called for follow up after his procedure on /02/07/2016. No answer at the number given for follow up phone call. A message was left on the answering machine.

## 2016-02-12 ENCOUNTER — Encounter: Payer: Self-pay | Admitting: Gastroenterology

## 2016-02-16 NOTE — Progress Notes (Signed)
   SUBJECTIVE Patient with a history of diabetes mellitus presents to office today complaining of elongated, thickened nails. Pain while ambulating in shoes. Patient is unable to trim their own nails.   OBJECTIVE General Patient is awake, alert, and oriented x 3 and in no acute distress. Derm Skin is dry and supple bilateral. Negative open lesions or macerations. Remaining integument unremarkable. Nails are tender, long, thickened and dystrophic with subungual debris, consistent with onychomycosis, 1-5 bilateral. No signs of infection noted. Vasc  DP and PT pedal pulses palpable bilaterally. Temperature gradient within normal limits.  Neuro Epicritic and protective threshold sensation diminished bilaterally.  Musculoskeletal Exam No symptomatic pedal deformities noted bilateral. Muscular strength within normal limits.  ASSESSMENT 1. Diabetes Mellitus w/ peripheral neuropathy 2. Onychomycosis of nail due to dermatophyte bilateral 3. Pain in foot bilateral  PLAN OF CARE 1. Patient evaluated today. 2. Instructed to maintain good pedal hygiene and foot care. Stressed importance of controlling blood sugar.  3. Mechanical debridement of nails 1-5 bilaterally performed using a nail nipper. Filed with dremel without incident.  4. Return to clinic in 3 mos.     Brent M. Evans, DPM [facility]  Dr. Brent M. Evans, DPM    [address]                                        [place], [place] [address]                Office [phone]  Fax [phone]       

## 2016-05-11 ENCOUNTER — Ambulatory Visit (INDEPENDENT_AMBULATORY_CARE_PROVIDER_SITE_OTHER): Payer: Medicare Other | Admitting: Podiatry

## 2016-05-11 DIAGNOSIS — L608 Other nail disorders: Secondary | ICD-10-CM

## 2016-05-11 DIAGNOSIS — L603 Nail dystrophy: Secondary | ICD-10-CM

## 2016-05-11 DIAGNOSIS — M79609 Pain in unspecified limb: Secondary | ICD-10-CM

## 2016-05-11 DIAGNOSIS — E0842 Diabetes mellitus due to underlying condition with diabetic polyneuropathy: Secondary | ICD-10-CM

## 2016-05-11 DIAGNOSIS — B351 Tinea unguium: Secondary | ICD-10-CM

## 2016-05-13 NOTE — Progress Notes (Signed)
   SUBJECTIVE Patient with a history of diabetes mellitus presents to office today complaining of elongated, thickened nails. Pain while ambulating in shoes. Patient is unable to trim their own nails.   OBJECTIVE General Patient is awake, alert, and oriented x 3 and in no acute distress. Derm Skin is dry and supple bilateral. Negative open lesions or macerations. Remaining integument unremarkable. Nails are tender, long, thickened and dystrophic with subungual debris, consistent with onychomycosis, 1-5 bilateral. No signs of infection noted. Vasc  DP and PT pedal pulses palpable bilaterally. Temperature gradient within normal limits.  Neuro Epicritic and protective threshold sensation diminished bilaterally.  Musculoskeletal Exam No symptomatic pedal deformities noted bilateral. Muscular strength within normal limits.  ASSESSMENT 1. Diabetes Mellitus w/ peripheral neuropathy 2. Onychomycosis of nail due to dermatophyte bilateral 3. Pain in foot bilateral  PLAN OF CARE 1. Patient evaluated today. 2. Instructed to maintain good pedal hygiene and foot care. Stressed importance of controlling blood sugar.  3. Mechanical debridement of nails 1-5 bilaterally performed using a nail nipper. Filed with dremel without incident.  4. Return to clinic in 3 mos.     Brent M. Evans, DPM [facility]  Dr. Brent M. Evans, DPM    [address]                                        [place], [place] [address]                Office [phone]  Fax [phone]       

## 2016-08-11 ENCOUNTER — Ambulatory Visit: Payer: Medicare Other | Admitting: Podiatry

## 2016-08-19 ENCOUNTER — Ambulatory Visit (INDEPENDENT_AMBULATORY_CARE_PROVIDER_SITE_OTHER): Payer: Medicare Other | Admitting: Podiatry

## 2016-08-19 ENCOUNTER — Encounter: Payer: Self-pay | Admitting: Podiatry

## 2016-08-19 DIAGNOSIS — M79609 Pain in unspecified limb: Secondary | ICD-10-CM

## 2016-08-19 DIAGNOSIS — B351 Tinea unguium: Secondary | ICD-10-CM | POA: Diagnosis not present

## 2016-08-19 DIAGNOSIS — E119 Type 2 diabetes mellitus without complications: Secondary | ICD-10-CM

## 2016-08-19 DIAGNOSIS — Z794 Long term (current) use of insulin: Secondary | ICD-10-CM

## 2016-08-19 NOTE — Progress Notes (Signed)
Patient ID: [name] [name], male   DOB: November 30, 1950, 66 y.o.   MRN: [id number]   Subjective: This patient presents today for scheduled visit complaining of uncomfortable toenails walking wearing shoes and request trimming of the toenails. Patient's last schedule visits for similar service was on 05/11/2016 Patient is diabetic with a history of neuropathy Patient denies skin ulceration claudication or amputation  Objective: Patient estimates 5 feet 10 inches to 320 pounds  Orientated 3 DP and PT pulses 2/4 bilaterally Capillary reflex normal limits bilaterally Sensation to 10 g monofilament wire intact 5/5 bilaterally Vibratory sensation reactive bilaterally Ankle reflex reactive bilaterally No open skin lesions bilaterally Hypertrophic, elongated, discolored toenails 6-10 HAV left Hammertoe second left no restriction ankle, subtalar, midtarsal joints bilaterally Manual motor testing dorsi flexion, plantar flexion 5/5 bilaterally  Assessment: History of peripheral neuropathy Type 2.diabetic Symptomatic mycotic toenails 6-10  Plan: Debrided toenails 6-10 mechanically and electrically without any bleeding  Reappoint 3 months

## 2016-08-19 NOTE — Patient Instructions (Signed)

## 2016-11-23 ENCOUNTER — Ambulatory Visit (INDEPENDENT_AMBULATORY_CARE_PROVIDER_SITE_OTHER): Payer: Medicare Other | Admitting: Podiatry

## 2016-11-23 ENCOUNTER — Encounter: Payer: Self-pay | Admitting: Podiatry

## 2016-11-23 DIAGNOSIS — E1142 Type 2 diabetes mellitus with diabetic polyneuropathy: Secondary | ICD-10-CM

## 2016-11-23 DIAGNOSIS — M79609 Pain in unspecified limb: Secondary | ICD-10-CM | POA: Diagnosis not present

## 2016-11-23 DIAGNOSIS — B351 Tinea unguium: Secondary | ICD-10-CM

## 2016-11-23 NOTE — Patient Instructions (Signed)

## 2016-11-23 NOTE — Progress Notes (Signed)
Patient ID: [name] [name], male   DOB: 1950/01/17, 66 y.o.   MRN: [id number]    Subjective: This patient presents today for scheduled visit complaining of uncomfortable toenails walking wearing shoes and request trimming of the toenails. Patient's last schedule visits for similar service was on 05/11/2016 Patient is diabetic with a history of neuropathy Patient denies skin ulceration claudication or amputation  Objective: Patient estimates 5 feet 10 inches to 320 pounds  Orientated 3 DP and PT pulses 2/4 bilaterally Capillary reflex normal limits bilaterally Sensation to 10 g monofilament wire intact 5/5 bilaterally Vibratory sensation reactive bilaterally Ankle reflex reactive bilaterally No open skin lesions bilaterally Hypertrophic, elongated, discolored toenails 6-10 HAV left Hammertoe second left no restriction ankle, subtalar, midtarsal joints bilaterally Manual motor testing dorsi flexion, plantar flexion 5/5 bilaterally  Assessment: History of peripheral neuropathy Type 2.diabetic Symptomatic mycotic toenails 6-10  Plan: Debrided toenails 6-10 mechanically and electrically without any bleeding  Reappoint 3 months

## 2017-02-22 ENCOUNTER — Ambulatory Visit (INDEPENDENT_AMBULATORY_CARE_PROVIDER_SITE_OTHER): Payer: Medicare HMO | Admitting: Podiatry

## 2017-02-22 ENCOUNTER — Encounter: Payer: Self-pay | Admitting: Podiatry

## 2017-02-22 DIAGNOSIS — M79676 Pain in unspecified toe(s): Secondary | ICD-10-CM | POA: Diagnosis not present

## 2017-02-22 DIAGNOSIS — M79609 Pain in unspecified limb: Principal | ICD-10-CM

## 2017-02-22 DIAGNOSIS — B351 Tinea unguium: Secondary | ICD-10-CM

## 2017-02-22 DIAGNOSIS — E0843 Diabetes mellitus due to underlying condition with diabetic autonomic (poly)neuropathy: Secondary | ICD-10-CM

## 2017-02-24 NOTE — Progress Notes (Signed)
   SUBJECTIVE Patient with a history of diabetes mellitus presents to office today complaining of elongated, thickened nails. Pain while ambulating in shoes. Patient is unable to trim their own nails.   Past Medical History:  Diagnosis Date  . Diabetes mellitus without complication ([facility])   . Hyperlipidemia   . Hypertension     OBJECTIVE General Patient is awake, alert, and oriented x 3 and in no acute distress. Derm Skin is dry and supple bilateral. Negative open lesions or macerations. Remaining integument unremarkable. Nails are tender, long, thickened and dystrophic with subungual debris, consistent with onychomycosis, 1-5 bilateral. No signs of infection noted. Vasc  DP and PT pedal pulses palpable bilaterally. Temperature gradient within normal limits.  Neuro Epicritic and protective threshold sensation diminished bilaterally.  Musculoskeletal Exam No symptomatic pedal deformities noted bilateral. Muscular strength within normal limits.  ASSESSMENT 1. Diabetes Mellitus w/ peripheral neuropathy 2. Onychomycosis of nail due to dermatophyte bilateral 3. Pain in foot bilateral  PLAN OF CARE 1. Patient evaluated today. 2. Instructed to maintain good pedal hygiene and foot care. Stressed importance of controlling blood sugar.  3. Mechanical debridement of nails 1-5 bilaterally performed using a nail nipper. Filed with dremel without incident.  4. Return to clinic in 3 mos.     Edrick Kins, DPM [facility]  Dr. Edrick Kins, [address]                                        [place], [place] [address]                Office [phone]  Fax [phone]

## 2017-05-24 ENCOUNTER — Ambulatory Visit: Payer: Medicare HMO | Admitting: Podiatry

## 2017-05-24 DIAGNOSIS — E0843 Diabetes mellitus due to underlying condition with diabetic autonomic (poly)neuropathy: Secondary | ICD-10-CM

## 2017-05-24 DIAGNOSIS — B351 Tinea unguium: Secondary | ICD-10-CM

## 2017-05-24 DIAGNOSIS — M79609 Pain in unspecified limb: Secondary | ICD-10-CM

## 2017-05-26 NOTE — Progress Notes (Signed)
   SUBJECTIVE Patient with a history of diabetes mellitus presents to office today complaining of elongated, thickened nails that cause pain while ambulating in shoes. He is unable to trim his own nails. Patient is here for further evaluation and treatment.   Past Medical History:  Diagnosis Date  . Diabetes mellitus without complication ([facility])   . Hyperlipidemia   . Hypertension     OBJECTIVE General Patient is awake, alert, and oriented x 3 and in no acute distress. Derm Skin is dry and supple bilateral. Negative open lesions or macerations. Remaining integument unremarkable. Nails are tender, long, thickened and dystrophic with subungual debris, consistent with onychomycosis, 1-5 bilateral. No signs of infection noted. Vasc  DP and PT pedal pulses palpable bilaterally. Temperature gradient within normal limits.  Neuro Epicritic and protective threshold sensation diminished bilaterally.  Musculoskeletal Exam No symptomatic pedal deformities noted bilateral. Muscular strength within normal limits.  ASSESSMENT 1. Diabetes Mellitus w/ peripheral neuropathy 2. Onychomycosis of nail due to dermatophyte bilateral 3. Pain in foot bilateral  PLAN OF CARE 1. Patient evaluated today. 2. Instructed to maintain good pedal hygiene and foot care. Stressed importance of controlling blood sugar.  3. Mechanical debridement of nails 1-5 bilaterally performed using a nail nipper. Filed with dremel without incident.  4. Return to clinic in 3 mos.     Junie Engram M. Addilynne Olheiser, DPM [facility]  Dr. Estoria Geary M. Lesli Issa, DPM    [address]                                        [place], [place] [address]                Office [phone]  Fax [phone]      

## 2017-07-14 ENCOUNTER — Institutional Professional Consult (permissible substitution): Payer: Medicare Other | Admitting: Pulmonary Disease

## 2017-08-11 ENCOUNTER — Ambulatory Visit (INDEPENDENT_AMBULATORY_CARE_PROVIDER_SITE_OTHER): Payer: Medicare HMO | Admitting: Pulmonary Disease

## 2017-08-11 ENCOUNTER — Encounter: Payer: Self-pay | Admitting: Pulmonary Disease

## 2017-08-11 VITALS — BP 120/70 | HR 90 | Ht 70.0 in | Wt 314.2 lb

## 2017-08-11 DIAGNOSIS — R0602 Shortness of breath: Secondary | ICD-10-CM | POA: Diagnosis not present

## 2017-08-11 DIAGNOSIS — R053 Chronic cough: Secondary | ICD-10-CM

## 2017-08-11 DIAGNOSIS — R05 Cough: Secondary | ICD-10-CM | POA: Diagnosis not present

## 2017-08-11 LAB — NITRIC OXIDE: Nitric Oxide: 15

## 2017-08-11 MED ORDER — FLUTICASONE PROPIONATE 50 MCG/ACT NA SUSP: 2.0000 | Freq: Every day | NASAL | 2 refills | Status: DC

## 2017-08-11 NOTE — Patient Instructions (Signed)
Will start on chlorpheniramine 8 mg 3 times daily, Flonase nasal spray Increase Prilosec to twice daily  Schedule you for pulmonary function test and high-resolution CT of the chest Continue albuterol for now Follow-up in 1 to 2 months.

## 2017-08-11 NOTE — Progress Notes (Signed)
[name] [name]    [id number]    16-Feb-1950  Primary Care Physician:Jones, Sherrill Raring, NP  Referring Physician: Berkley Harvey, NP [place], [place] [address]  Chief complaint: Consult for chronic cough  HPI: 67 year old with history of diabetes, chronic kidney disease, chronic cough, hypertension Complains of cough for many years, occasionally productive.  No wheezing, dyspnea He has been treated with his primary care with albuterol inhaler and Protonix with no improvement in symptoms Has occasional GERD symptoms, denies any seasonal allergies, postnasal drip  Pets: Cat, dog.  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  No mold, hot tub, Jacuzzi. Smoking history: 20-30-pack-year smoker.  Quit in 2003  Travel history: Lived in [place], [place], [place] Relevant family history: No significant family history of lung disease.  Outpatient Encounter Medications as of 08/11/2017  Medication Sig  . aspirin EC 81 MG tablet Take 1 tablet by mouth daily.  . calcium-vitamin D (CALCIUM 500/D) 500-200 MG-UNIT tablet Take 1 tablet by mouth daily.  . Lancets (ACCU-CHEK MULTICLIX) lancets   . losartan-hydrochlorothiazide (HYZAAR) 50-12.5 MG tablet Take 1 tablet by mouth daily.  . metFORMIN (GLUCOPHAGE) 500 MG tablet Take 1 tablet by mouth daily.  . Multiple Vitamin (MULTI-VITAMINS) TABS Take 1 tablet by mouth daily.  . Omega-3 Fatty Acids (SM FISH OIL) 1000 MG CAPS Take 1 capsule by mouth 2 (two) times daily.  . pantoprazole (PROTONIX) 20 MG tablet Take 20 mg by mouth daily.  . sitaGLIPtin (JANUVIA) 50 MG tablet Take 50 mg by mouth daily.  Marland Kitchen telmisartan-hydrochlorothiazide (MICARDIS HCT) 40-12.5 MG tablet Take 1 tablet by mouth daily.  Marland Kitchen albuterol (PROVENTIL HFA;VENTOLIN  HFA) 108 (90 Base) MCG/ACT inhaler Inhale 1 puff into the lungs as needed.  Marland Kitchen atorvastatin (LIPITOR) 20 MG tablet Take 20 mg by mouth daily.   Facility-Administered Encounter Medications as of 08/11/2017  Medication  . 0.9 %  sodium chloride infusion    Allergies as of 08/11/2017 - Review Complete 08/11/2017  Allergen Reaction Noted  . Mercury Swelling 02/20/2015    Past Medical History:  Diagnosis Date  . Diabetes mellitus without complication ([place])   . Hyperlipidemia   . Hypertension     Past Surgical History:  Procedure Laterality Date  . COLONOSCOPY  at age [age] yrs ago   pt cannot remember place. exam normal per pt.   Marland Kitchen HERNIA REPAIR    . TONSILLECTOMY    . UMBILICAL HERNIA REPAIR    . VASECTOMY      Family History  Problem Relation Age of Onset  . Cancer Mother        ? type  . Heart disease Father   . Heart disease Brother   . Colon cancer Neg Hx     Social History   Socioeconomic History  . Marital status: Married    Spouse name: Not on file  . Number of children: 2  . Years of education: Not on file  . Highest education level: Not on file  Occupational History  . Occupation: retired  Scientific laboratory technician  . Financial resource strain: Not on file  . Food insecurity:    Worry: Not on file    Inability: Not on file  . Transportation needs:  Medical: Not on file    Non-medical: Not on file  Tobacco Use  . Smoking status: Former Smoker    Last attempt to quit: 01/05/2002    Years since quitting: 15.6  . Smokeless tobacco: Never Used  Substance and Sexual Activity  . Alcohol use: No  . Drug use: No  . Sexual activity: Not on file  Lifestyle  . Physical activity:    Days per week: Not on file    Minutes per session: Not on file  . Stress: Not on file  Relationships  . Social connections:    Talks on phone: Not on file    Gets together: Not on file    Attends religious service: Not on file    Active member of club or organization: Not on file     Attends meetings of clubs or organizations: Not on file    Relationship status: Not on file  . Intimate partner violence:    Fear of current or ex partner: Not on file    Emotionally abused: Not on file    Physically abused: Not on file    Forced sexual activity: Not on file  Other Topics Concern  . Not on file  Social History Narrative  . Not on file    Review of systems: Review of Systems  Constitutional: Negative for fever and chills.  HENT: Negative.   Eyes: Negative for blurred vision.  Respiratory: as per HPI  Cardiovascular: Negative for chest pain and palpitations.  Gastrointestinal: Negative for vomiting, diarrhea, blood per rectum. Genitourinary: Negative for dysuria, urgency, frequency and hematuria.  Musculoskeletal: Negative for myalgias, back pain and joint pain.  Skin: Negative for itching and rash.  Neurological: Negative for dizziness, tremors, focal weakness, seizures and loss of consciousness.  Endo/Heme/Allergies: Negative for environmental allergies.  Psychiatric/Behavioral: Negative for depression, suicidal ideas and hallucinations.  All other systems reviewed and are negative.  Physical Exam: Blood pressure 120/70, pulse 90, height 5\' 10"  (1.778 m), weight (!) 314 lb 3.2 oz (142.5 kg), SpO2 96 %. Gen:      No acute distress HEENT:  EOMI, sclera anicteric Neck:     No masses; no thyromegaly Lungs:    Clear to auscultation bilaterally; normal respiratory effort CV:         Regular rate and rhythm; no murmurs Abd:      + bowel sounds; soft, non-tender; no palpable masses, no distension Ext:    No edema; adequate peripheral perfusion Skin:      Warm and dry; no rash Neuro: alert and oriented x 3 Psych: normal mood and affect  Data Reviewed: Chest x-ray 05/06/2017- stable cardiomegaly, mild basilar atelectasis/scarring  Assessment:  Evaluation for chronic cough We will need evaluation for COPD given his smoking history.  Chest x-ray earlier this year  shows basilar scarring Get PFTs, high-resolution CT for further evaluation  Continue albuterol for now While we await the results of these test I will increase his PPI to twice daily and treat him empirically for postnasal drip Start chlorpheniramine, Flonase nasal spray.  Plan/Recommendations: - PFTs, high-resolution CT - Continue albuterol - Increase Prilosec to twice daily - Start chlorpheniramine, Flonase nasal spray  Marshell Garfinkel MD [facility] 08/11/2017, 11:31 AM  CC: Berkley Harvey, NP

## 2017-08-11 NOTE — Addendum Note (Signed)
Addended by: Jannette Spanner on: 08/11/2017 12:01 PM   Modules accepted: Orders

## 2017-08-20 ENCOUNTER — Ambulatory Visit (INDEPENDENT_AMBULATORY_CARE_PROVIDER_SITE_OTHER)
Admission: RE | Admit: 2017-08-20 | Discharge: 2017-08-20 | Disposition: A | Discharge status: Home / Self Care | Payer: Medicare HMO | Source: Ambulatory Visit | Attending: Pulmonary Disease | Admitting: Pulmonary Disease

## 2017-08-20 DIAGNOSIS — R0602 Shortness of breath: Secondary | ICD-10-CM

## 2017-08-25 ENCOUNTER — Ambulatory Visit: Payer: Medicare HMO | Admitting: Podiatry

## 2017-08-25 ENCOUNTER — Other Ambulatory Visit: Payer: Self-pay | Admitting: Pulmonary Disease

## 2017-08-25 DIAGNOSIS — R918 Other nonspecific abnormal finding of lung field: Secondary | ICD-10-CM

## 2017-09-13 ENCOUNTER — Ambulatory Visit (INDEPENDENT_AMBULATORY_CARE_PROVIDER_SITE_OTHER): Payer: Medicare HMO | Admitting: Podiatry

## 2017-09-13 ENCOUNTER — Encounter: Payer: Self-pay | Admitting: Podiatry

## 2017-09-13 DIAGNOSIS — M79609 Pain in unspecified limb: Secondary | ICD-10-CM

## 2017-09-13 DIAGNOSIS — B351 Tinea unguium: Secondary | ICD-10-CM

## 2017-09-13 DIAGNOSIS — E0843 Diabetes mellitus due to underlying condition with diabetic autonomic (poly)neuropathy: Secondary | ICD-10-CM

## 2017-09-14 ENCOUNTER — Encounter: Payer: Self-pay | Admitting: Pulmonary Disease

## 2017-09-14 ENCOUNTER — Ambulatory Visit (INDEPENDENT_AMBULATORY_CARE_PROVIDER_SITE_OTHER): Payer: Medicare HMO | Admitting: Pulmonary Disease

## 2017-09-14 VITALS — BP 142/74 | HR 78 | Ht 67.0 in | Wt 316.4 lb

## 2017-09-14 DIAGNOSIS — R918 Other nonspecific abnormal finding of lung field: Secondary | ICD-10-CM

## 2017-09-14 DIAGNOSIS — R0602 Shortness of breath: Secondary | ICD-10-CM | POA: Diagnosis not present

## 2017-09-14 DIAGNOSIS — J449 Chronic obstructive pulmonary disease, unspecified: Secondary | ICD-10-CM

## 2017-09-14 LAB — PULMONARY FUNCTION TEST
DL/VA % PRED: 109 %
DL/VA: 4.85 ml/min/mmHg/L
DLCO unc % pred: 106 %
DLCO unc: 30.25 ml/min/mmHg
FEF 25-75 POST: 2.4 L/s
FEF 25-75 PRE: 1.98 L/s
FEF2575-%CHANGE-POST: 21 %
FEF2575-%PRED-PRE: 84 %
FEF2575-%Pred-Post: 102 %
FEV1-%Change-Post: 4 %
FEV1-%PRED-POST: 92 %
FEV1-%Pred-Pre: 88 %
FEV1-Post: 2.76 L
FEV1-Pre: 2.64 L
FEV1FVC-%CHANGE-POST: 0 %
FEV1FVC-%PRED-PRE: 100 %
FEV6-%Change-Post: 4 %
FEV6-%PRED-PRE: 93 %
FEV6-%Pred-Post: 96 %
FEV6-Post: 3.69 L
FEV6-Pre: 3.55 L
FEV6FVC-%Change-Post: 0 %
FEV6FVC-%Pred-Post: 105 %
FEV6FVC-%Pred-Pre: 106 %
FVC-%CHANGE-POST: 4 %
FVC-%PRED-PRE: 87 %
FVC-%Pred-Post: 91 %
FVC-POST: 3.71 L
FVC-PRE: 3.55 L
POST FEV6/FVC RATIO: 99 %
PRE FEV1/FVC RATIO: 74 %
Post FEV1/FVC ratio: 74 %
Pre FEV6/FVC Ratio: 100 %
RV % pred: 174 %
RV: 3.87 L
TLC % PRED: 117 %
TLC: 7.52 L

## 2017-09-14 MED ORDER — CHLORPHENIRAMINE MALEATE 4 MG PO TABS: 8.0000 mg | ORAL_TABLET | Freq: Three times a day (TID) | ORAL | 1 refills | Status: DC

## 2017-09-14 MED ORDER — FLUTICASONE FUROATE-VILANTEROL 200-25 MCG/INH IN AEPB: 1.0000 | INHALATION_SPRAY | Freq: Every day | RESPIRATORY_TRACT | 2 refills | Status: AC

## 2017-09-14 MED ORDER — FLUTICASONE FUROATE-VILANTEROL 200-25 MCG/INH IN AEPB: 1.0000 | INHALATION_SPRAY | Freq: Every day | RESPIRATORY_TRACT | 0 refills | Status: AC

## 2017-09-14 NOTE — Progress Notes (Signed)
Patient completed full PFT today. 

## 2017-09-14 NOTE — Patient Instructions (Addendum)
Continue your antiacid medication, Flonase Start chlorpheniramine 8 mg 3 times daily We will start you on Breo inhaler Follow-up CT without contrast in 1 year We will get CBC differential, IgE and alpha-1 antitrypsin levels and phenotype Follow-up in clinic in 3 months

## 2017-09-14 NOTE — Progress Notes (Addendum)
LINDBERGH WINKLES    [id number]    12/10/1950  Primary Care Physician:Jones, Sherrill Raring, NP  Referring Physician: Berkley Harvey, NP [place], [place] [address]  Chief complaint: Follow up for chronic cough  HPI: 67 year old with history of diabetes, chronic kidney disease, chronic cough, hypertension Complains of cough for many years, occasionally productive.  No wheezing, dyspnea He has been treated with his primary care with albuterol inhaler and Protonix with no improvement in symptoms Has occasional GERD symptoms, denies any seasonal allergies, postnasal drip  Pets: Cat, dog.  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  No mold, hot tub, Jacuzzi. Smoking history: 20-30-pack-year smoker.  Quit in 2003  Travel history: Lived in [place], [place], [place] Relevant family history: No significant family history of lung disease.  Interim history Mr. Elting is here for review of PFTs.  States that cough is unchanged Denies any dyspnea, cough, wheezing, sputum production.  Outpatient Encounter Medications as of 09/14/2017  Medication Sig  . aspirin EC 81 MG tablet Take 1 tablet by mouth daily.  . Blood Glucose Monitoring Suppl (FIFTY50 GLUCOSE METER 2.0) w/Device KIT Use as instructed  . calcium-vitamin D (CALCIUM 500/D) 500-200 MG-UNIT tablet Take 1 tablet by mouth daily.  . fluticasone (FLONASE) 50 MCG/ACT nasal spray Place 2 sprays into both nostrils daily.  . Lancets (ACCU-CHEK MULTICLIX) lancets   . losartan-hydrochlorothiazide (HYZAAR) 50-12.5 MG tablet Take 1 tablet by mouth daily.  . meloxicam (MOBIC) 15 MG tablet TAKE 1 TABLET BY MOUTH ONCE DAILY FOR 10 DAYS THEN AS NEEDED  . metFORMIN (GLUCOPHAGE) 500 MG tablet Take 1 tablet by mouth daily.    . montelukast (SINGULAIR) 10 MG tablet   . Multiple Vitamin (MULTI-VITAMINS) TABS Take 1 tablet by mouth daily.  . Omega-3 Fatty Acids (SM FISH OIL) 1000 MG CAPS Take 1 capsule by mouth 2 (two) times daily.  [name] test strip USE 1 STRIP TO CHECK GLUCOSE ONCE DAILY FOR BLOOD SUGAR  . pantoprazole (PROTONIX) 20 MG tablet Take 20 mg by mouth daily.  . sitaGLIPtin (JANUVIA) 50 MG tablet Take 50 mg by mouth daily.  Marland Kitchen telmisartan-hydrochlorothiazide (MICARDIS HCT) 40-12.5 MG tablet Take 1 tablet by mouth daily.  Marland Kitchen albuterol (PROVENTIL HFA;VENTOLIN HFA) 108 (90 Base) MCG/ACT inhaler Inhale 1 puff into the lungs as needed.  Marland Kitchen atorvastatin (LIPITOR) 20 MG tablet Take 20 mg by mouth daily.   Facility-Administered Encounter Medications as of 09/14/2017  Medication  . 0.9 %  sodium chloride infusion   Physical Exam: Blood pressure (!) 142/74, pulse 78, height 5' 7"  (1.702 m), weight (!) 316 lb 6.4 oz (143.5 kg), SpO2 98 %. Gen:      No acute distress HEENT:  EOMI, sclera anicteric Neck:     No masses; no thyromegaly Lungs:    Clear to auscultation bilaterally; normal respiratory effort CV:         Regular rate and rhythm; no murmurs Abd:      + bowel sounds; soft, non-tender; no palpable masses, no distension Ext:    No edema; adequate peripheral perfusion Skin:      Warm and dry; no rash Neuro: alert and oriented x 3 Psych:  normal mood and affect  Data Reviewed: Chest x-ray 05/06/2017- stable cardiomegaly, mild basilar atelectasis/scarring CT high-resolution 08/20/2017- minimal bibasal predominant reticulation.  Mild emphysema, 5 mm right lower lobe subpleural nodule.  PFTs 09/14/2017 FVC 3.71 [91%), FEV1 2.76 [92%], F/F 76, TLC 117%, DLCO 106%. Minimal obstructive airway disease  Assessment:  Evaluation for chronic cough  PFTs reviewed with no overt obstruction however there is curvature to the flow loop suggestive of small airway disease.  He does have mild emphysema on CT We  will give him Breo inhaler to try out Check CBC with differential, IgE, alpha-1 antitrypsin test  There is no evidence of interstitial lung disease.  There is very minimal reticulation at the bases which is not very concerning Suspect a component of upper airway cough syndrome.  Continue Prilosec twice daily, Flonase nasal spray Start chlorpheniramine 8 mg 3 times daily.  Subcentimeter pulmonary nodule 28m right lower lobe subpleural nodule noted.  Follow-up CT in 1 year.  Plan/Recommendations: - Chlorpheniramine, continue Flonase, Prilosec - Check CBC with differential, IgE, alpha-1 antitrypsin test - Start Breo - CT in 1 year.  PMarshell GarfinkelMD [facility] 09/14/2017, 1:25 PM  CC: JBerkley Harvey NP

## 2017-09-14 NOTE — Progress Notes (Signed)
   SUBJECTIVE Patient with a history of diabetes mellitus presents to office today complaining of elongated, thickened nails that cause pain while ambulating in shoes. He is unable to trim his own nails. Patient is here for further evaluation and treatment.   Past Medical History:  Diagnosis Date  . Diabetes mellitus without complication ([place])   . Hyperlipidemia   . Hypertension     OBJECTIVE General Patient is awake, alert, and oriented x 3 and in no acute distress. Derm Skin is dry and supple bilateral. Negative open lesions or macerations. Remaining integument unremarkable. Nails are tender, long, thickened and dystrophic with subungual debris, consistent with onychomycosis, 1-5 bilateral. No signs of infection noted. Vasc  DP and PT pedal pulses palpable bilaterally. Temperature gradient within normal limits.  Neuro Epicritic and protective threshold sensation diminished bilaterally.  Musculoskeletal Exam No symptomatic pedal deformities noted bilateral. Muscular strength within normal limits.  ASSESSMENT 1. Diabetes Mellitus w/ peripheral neuropathy 2. Onychomycosis of nail due to dermatophyte bilateral 3. Pain in foot bilateral  PLAN OF CARE 1. Patient evaluated today. 2. Instructed to maintain good pedal hygiene and foot care. Stressed importance of controlling blood sugar.  3. Mechanical debridement of nails 1-5 bilaterally performed using a nail nipper. Filed with dremel without incident.  4. Return to clinic in 3 mos.     Edrick Kins, DPM [facility]  Dr. Edrick Kins, [address]                                        [place], [place] [address]                Office [phone]  Fax [phone]

## 2017-12-13 ENCOUNTER — Ambulatory Visit: Payer: Medicare HMO | Admitting: Podiatry

## 2017-12-20 ENCOUNTER — Ambulatory Visit: Payer: Medicare HMO | Admitting: Pulmonary Disease

## 2017-12-20 ENCOUNTER — Encounter: Payer: Self-pay | Admitting: Pulmonary Disease

## 2017-12-20 ENCOUNTER — Ambulatory Visit (INDEPENDENT_AMBULATORY_CARE_PROVIDER_SITE_OTHER)
Admission: RE | Admit: 2017-12-20 | Discharge: 2017-12-20 | Disposition: A | Discharge status: Home / Self Care | Payer: Medicare HMO | Source: Ambulatory Visit | Attending: Pulmonary Disease | Admitting: Pulmonary Disease

## 2017-12-20 VITALS — BP 146/72 | HR 61 | Ht 67.0 in | Wt 315.0 lb

## 2017-12-20 DIAGNOSIS — J449 Chronic obstructive pulmonary disease, unspecified: Secondary | ICD-10-CM

## 2017-12-20 LAB — CBC WITH DIFFERENTIAL/PLATELET
BASOS ABS: 0.1 10*3/uL (ref 0.0–0.1)
Basophils Relative: 0.7 % (ref 0.0–3.0)
Eosinophils Absolute: 0.5 10*3/uL (ref 0.0–0.7)
Eosinophils Relative: 4.1 % (ref 0.0–5.0)
HCT: 38.6 % — ABNORMAL LOW (ref 39.0–52.0)
Hemoglobin: 12.8 g/dL — ABNORMAL LOW (ref 13.0–17.0)
LYMPHS ABS: 2.5 10*3/uL (ref 0.7–4.0)
Lymphocytes Relative: 22.6 % (ref 12.0–46.0)
MCHC: 33.1 g/dL (ref 30.0–36.0)
MCV: 83.6 fl (ref 78.0–100.0)
Monocytes Absolute: 1 10*3/uL (ref 0.1–1.0)
Monocytes Relative: 8.8 % (ref 3.0–12.0)
Neutro Abs: 7 10*3/uL (ref 1.4–7.7)
Neutrophils Relative %: 63.8 % (ref 43.0–77.0)
Platelets: 283 10*3/uL (ref 150.0–400.0)
RBC: 4.61 Mil/uL (ref 4.22–5.81)
RDW: 15 % (ref 11.5–15.5)
WBC: 10.9 10*3/uL — ABNORMAL HIGH (ref 4.0–10.5)

## 2017-12-20 MED ORDER — PANTOPRAZOLE SODIUM 20 MG PO TBEC: 20.0000 mg | DELAYED_RELEASE_TABLET | Freq: Two times a day (BID) | ORAL | 2 refills | Status: DC

## 2017-12-20 MED ORDER — FLUTICASONE FUROATE-VILANTEROL 200-25 MCG/INH IN AEPB: 1.0000 | INHALATION_SPRAY | Freq: Every day | RESPIRATORY_TRACT | 5 refills | Status: AC

## 2017-12-20 NOTE — Progress Notes (Signed)
[name] [name]    [id number]    August 25, 1950  Primary Care Physician:Jones, [name] Raring, NP  Referring Physician: Berkley Harvey, NP [name], [place] [address]  Chief complaint: Follow up for chronic cough, emphysema  HPI: 67 year old with history of diabetes, chronic kidney disease, chronic cough, hypertension Complains of cough for many years, occasionally productive.  No wheezing, dyspnea He has been treated with his primary care with albuterol inhaler and Protonix with no improvement in symptoms Has occasional GERD symptoms, denies any seasonal allergies, postnasal drip  Pets: Cat, dog.  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  No mold, hot tub, Jacuzzi. Smoking history: 20-30-pack-year smoker.  Quit in 2003  Travel history: Lived in [place], [place], [place] Relevant family history: No significant family history of lung disease.  Interim history Given Breo sample at last visit.  He used this for 2 weeks but did not call for a prescription States that breathing is worse with cough, dyspnea, chest congestion  Outpatient Encounter Medications as of 12/20/2017  Medication Sig  . aspirin EC 81 MG tablet Take 1 tablet by mouth daily.  . Blood Glucose Monitoring Suppl (FIFTY50 GLUCOSE METER 2.0) w/Device KIT Use as instructed  . calcium-vitamin D (CALCIUM 500/D) 500-200 MG-UNIT tablet Take 1 tablet by mouth daily.  . chlorpheniramine (CHLOR-TRIMETON) 4 MG tablet Take 2 tablets (8 mg total) by mouth 3 (three) times daily.  . fluticasone (FLONASE) 50 MCG/ACT nasal spray Place 2 sprays into both nostrils daily.  . Lancets (ACCU-CHEK MULTICLIX) lancets   . losartan-hydrochlorothiazide (HYZAAR) 50-12.5 MG tablet Take 1 tablet by mouth daily.  .  meloxicam (MOBIC) 15 MG tablet TAKE 1 TABLET BY MOUTH ONCE DAILY FOR 10 DAYS THEN AS NEEDED  . metFORMIN (GLUCOPHAGE) 500 MG tablet Take 1 tablet by mouth daily.  . montelukast (SINGULAIR) 10 MG tablet   . Multiple Vitamin (MULTI-VITAMINS) TABS Take 1 tablet by mouth daily.  . Omega-3 Fatty Acids (SM FISH OIL) 1000 MG CAPS Take 1 capsule by mouth 2 (two) times daily.  [name] test strip USE 1 STRIP TO CHECK GLUCOSE ONCE DAILY FOR BLOOD SUGAR  . pantoprazole (PROTONIX) 20 MG tablet Take 20 mg by mouth daily.  . sitaGLIPtin (JANUVIA) 50 MG tablet Take 50 mg by mouth daily.  Marland Kitchen telmisartan-hydrochlorothiazide (MICARDIS HCT) 40-12.5 MG tablet Take 1 tablet by mouth daily.  Marland Kitchen albuterol (PROVENTIL HFA;VENTOLIN HFA) 108 (90 Base) MCG/ACT inhaler Inhale 1 puff into the lungs as needed.  Marland Kitchen atorvastatin (LIPITOR) 20 MG tablet Take 20 mg by mouth daily.   Facility-Administered Encounter Medications as of 12/20/2017  Medication  . 0.9 %  sodium chloride infusion   Physical Exam: Blood pressure (!) 146/72, pulse 61, height _0  (1.702 m), weight (!) 315 lb (142.9 kg), SpO2 97 %. Gen:      No acute distress HEENT:  EOMI, sclera anicteric Neck:     No masses; no thyromegaly Lungs:    Scattered crackles, expiratory wheeze. CV:         Regular rate and rhythm; no murmurs Abd:      + bowel sounds; soft, non-tender; no palpable masses, no distension Ext:  No edema; adequate peripheral perfusion Skin:      Warm and dry; no rash Neuro: alert and oriented x 3 Psych: normal mood and affect  Data Reviewed: Imaging Chest x-ray 05/06/2017- stable cardiomegaly, mild basilar atelectasis/scarring CT high-resolution 08/20/2017- minimal bibasal predominant reticulation.  Mild emphysema, 5 mm right lower lobe subpleural nodule.  PFTs  09/14/2017 FVC 3.71 [91%), FEV1 2.76 [92%], F/F 76, TLC 117%, DLCO 106%. Minimal obstructive airway disease  FENO 08/11/2017-15  Labs: CBC 12/14/2008-WBC 9.7, eos 3%,  absolute eosinophil count 291   Assessment:  Evaluation for chronic cough, emphysema PFTs reviewed with no overt obstruction however there is curvature to the flow loop suggestive of small airway disease.  He does have mild emphysema on CT  We will restart him on Breo inhaler.  Check CBC differential, IgE, alpha-1 antitrypsin test.  There is no evidence of interstitial lung disease.  There is very minimal reticulation at the bases which is not very concerning Suspect a component of upper airway cough syndrome.  Continue Prilosec twice daily, Flonase nasal spray Start chlorpheniramine 8 mg 3 times daily.  Subcentimeter pulmonary nodule 75m right lower lobe subpleural nodule noted.  Follow-up CT in 1 year.  Suspected sleep apnea Suggested a sleep study however [facility] will like to see if he can get the study done today.  Plan/Recommendations: - Chlorpheniramine, continue Flonase, Prilosec - Check CBC with differential, IgE, alpha-1 antitrypsin test - Start Breo - CT in 1 year.  PMarshell GarfinkelMD [facility] 12/20/2017, 9:02 AM  CC: JBerkley Harvey NP

## 2017-12-20 NOTE — Patient Instructions (Addendum)
We will restart you on the Breo inhaler CBC differential, IgE and alpha-1 antitrypsin levels and phenotype We will get a chest x-ray for evaluation of the lungs  For your cough start using the antiacid medication protonix twice daily Continue using the Flonase nasal spray You can use chlorpheniramine over-the-counter 8 mg 3 times daily for 2 to 4 weeks  Follow-up in 1 to 2 months.

## 2017-12-20 NOTE — Addendum Note (Signed)
Addended by: Suzzanne Cloud E on: 12/20/2017 09:23 AM   Modules accepted: Orders

## 2017-12-20 NOTE — Addendum Note (Signed)
Addended by: Maryanna Shape A on: 12/20/2017 10:06 AM   Modules accepted: Orders

## 2017-12-24 LAB — IGE: IGE (IMMUNOGLOBULIN E), SERUM: 4 kU/L (ref <=114)

## 2017-12-24 LAB — ALPHA-1 ANTITRYPSIN PHENOTYPE: A-1 Antitrypsin, Ser: 157 mg/dL (ref 83–199)

## 2017-12-27 ENCOUNTER — Telehealth: Payer: Self-pay | Admitting: Pulmonary Disease

## 2017-12-27 NOTE — Telephone Encounter (Signed)
Received PA request for patients pantoprazole. Done via [email or web address]  Medication name and strength: Pantoprazole 20mg  BID Provider: Dr. Vaughan Browner Pharmacy: [facility] [address] Patient insurance ID: [id number] Phone:  Fax: [phone]  Was the PA started on CMM?  yes If yes, please enter the Key: [id number] Timeframe for approval/denial: 48-72 hours

## 2017-12-28 NOTE — Telephone Encounter (Signed)
Denied on December 23  PA Case: [id number], Status:  Denied. Questions? Contact [phone]. Called and spoke with Verdis Frederickson with [facility] regarding the denial at phone [phone] today. Case#[id number] Key [id number] for Pantoprazole 20mg  She advised this PA was denied due the dx of cough. Per determination, cough is not a dx that is covered for this medication per FDA guidelines.  Office is closed early today and all day tomorrow due to the Christmas Holiday.  LVM for patient regarding Pantoprazole 20mg  has been denied. Routing message to [place] to f/u with patient.

## 2017-12-30 NOTE — Telephone Encounter (Signed)
ATC pt, no answer. Left message for pt to call back.  

## 2018-01-04 NOTE — Telephone Encounter (Signed)
lmtcb x2 for pt. 

## 2018-01-11 NOTE — Telephone Encounter (Signed)
lmtcb for pt.  

## 2018-01-18 NOTE — Telephone Encounter (Signed)
Protonix 20mg  has been denied by insurance.  Dr. Vaughan Browner please advise if you would like to appeal or switch to alternative.

## 2018-01-24 NOTE — Telephone Encounter (Signed)
Switch to alternative.  See what is covered by insurance Or he can use Prilosec over-the-counter twice daily.

## 2018-01-24 NOTE — Telephone Encounter (Signed)
LMTCB

## 2018-02-22 ENCOUNTER — Encounter: Payer: Self-pay | Admitting: Pulmonary Disease

## 2018-02-22 ENCOUNTER — Ambulatory Visit: Payer: Medicare HMO | Admitting: Pulmonary Disease

## 2018-02-22 VITALS — BP 140/78 | HR 86 | Ht 67.0 in | Wt 322.0 lb

## 2018-02-22 DIAGNOSIS — R053 Chronic cough: Secondary | ICD-10-CM

## 2018-02-22 DIAGNOSIS — J449 Chronic obstructive pulmonary disease, unspecified: Secondary | ICD-10-CM

## 2018-02-22 DIAGNOSIS — R05 Cough: Secondary | ICD-10-CM | POA: Diagnosis not present

## 2018-02-22 MED ORDER — FLUTICASONE FUROATE-VILANTEROL 100-25 MCG/INH IN AEPB: 1.0000 | INHALATION_SPRAY | Freq: Every day | RESPIRATORY_TRACT | 5 refills | Status: AC

## 2018-02-22 NOTE — Patient Instructions (Addendum)
Glad you are doing well with your breathing We will start you on Breo 100 inhaler. Use albuterol as needed  Follow-up in 6 months.

## 2018-02-22 NOTE — Progress Notes (Signed)
KHRYSTIAN SCHAUF    [id number]    02-14-1950  Primary Care Physician:Jones, Sherrill Raring, NP  Referring Physician: Berkley Harvey, NP [place], [place] [address]  Chief complaint: Follow up for chronic cough, mild COPD, asthma  HPI: 68 year old with history of diabetes, chronic kidney disease, chronic cough, hypertension Complains of cough for many years, occasionally productive.  No wheezing, dyspnea He has been treated with his primary care with albuterol inhaler and Protonix with no improvement in symptoms Has occasional GERD symptoms, denies any seasonal allergies, postnasal drip  Pets: Cat, dog.  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  No mold, hot tub, Jacuzzi. Smoking history: 20-30-pack-year smoker.  Quit in 2003  Travel history: Lived in [place], [place], [place] Relevant family history: No significant family history of lung disease.  Interim history Prescribed Breo inhaler.  He stopped it after 1 month and has not called for a refill States that breathing is slightly better while on the inhaler  He has complains of chronic dyspnea on exertion, cough which is nonproductive in nature.  No fevers, chills.  Outpatient Encounter Medications as of 02/22/2018  Medication Sig  . aspirin EC 81 MG tablet Take 1 tablet by mouth daily.  . Blood Glucose Monitoring Suppl (FIFTY50 GLUCOSE METER 2.0) w/Device KIT Use as instructed  . calcium-vitamin D (CALCIUM 500/D) 500-200 MG-UNIT tablet Take 1 tablet by mouth daily.  . chlorpheniramine (CHLOR-TRIMETON) 4 MG tablet Take 2 tablets (8 mg total) by mouth 3 (three) times daily.  . fluticasone (FLONASE) 50 MCG/ACT nasal spray Place 2 sprays into both nostrils daily.  . Lancets (ACCU-CHEK MULTICLIX) lancets    . losartan-hydrochlorothiazide (HYZAAR) 50-12.5 MG tablet Take 1 tablet by mouth daily.  . meloxicam (MOBIC) 15 MG tablet TAKE 1 TABLET BY MOUTH ONCE DAILY FOR 10 DAYS THEN AS NEEDED  . metFORMIN (GLUCOPHAGE) 500 MG tablet Take 1 tablet by mouth daily.  . montelukast (SINGULAIR) 10 MG tablet   . Multiple Vitamin (MULTI-VITAMINS) TABS Take 1 tablet by mouth daily.  . Omega-3 Fatty Acids (SM FISH OIL) 1000 MG CAPS Take 1 capsule by mouth 2 (two) times daily.  [name] test strip USE 1 STRIP TO CHECK GLUCOSE ONCE DAILY FOR BLOOD SUGAR  . pantoprazole (PROTONIX) 20 MG tablet Take 1 tablet (20 mg total) by mouth 2 (two) times daily.  . sitaGLIPtin (JANUVIA) 50 MG tablet Take 50 mg by mouth daily.  Marland Kitchen telmisartan-hydrochlorothiazide (MICARDIS HCT) 40-12.5 MG tablet Take 1 tablet by mouth daily.  Marland Kitchen albuterol (PROVENTIL HFA;VENTOLIN HFA) 108 (90 Base) MCG/ACT inhaler Inhale 1 puff into the lungs as needed.  Marland Kitchen atorvastatin (LIPITOR) 20 MG tablet Take 20 mg by mouth daily.   Facility-Administered Encounter Medications as of 02/22/2018  Medication  . 0.9 %  sodium chloride infusion   Physical Exam: Blood pressure 140/78, pulse 86, height 5' 7"  (1.702 m), weight (!) 322 lb (146.1 kg), SpO2 96 %. Gen:      No acute distress HEENT:  EOMI, sclera anicteric Neck:     No masses; no thyromegaly Lungs:    Clear to auscultation bilaterally; normal respiratory effort CV:  Regular rate and rhythm; no murmurs Abd:      + bowel sounds; soft, non-tender; no palpable masses, no distension Ext:    No edema; adequate peripheral perfusion Skin:      Warm and dry; no rash Neuro: alert and oriented x 3 Psych: normal mood and affect  Data Reviewed: Imaging Chest x-ray 05/06/2017- stable cardiomegaly, mild basilar atelectasis/scarring CT high-resolution 08/20/2017- minimal bibasal predominant reticulation.  Mild emphysema, 5 mm right lower lobe subpleural nodule.  PFTs  09/14/2017 FVC 3.71 [91%), FEV1  2.76 [92%], F/F 76, TLC 117%, DLCO 106%. Minimal obstructive airway disease  FENO 08/11/2017-15  Labs: CBC 12/14/2008-WBC 9.7, eos 3%, absolute eosinophil count 291 CBC 12/20/2017-WBC 10.9, eos 4.1%, absolute eosinophil count 447 IgE 12/20/2017-4 Alpha-1 antitrypsin 02/20/2017-157, PIMM  Assessment:  COPD, asthma PFTs reviewed with no overt obstruction however there is curvature to the flow loop suggestive of small airway disease.  He does have mild emphysema on CT.  Blood work noted for elevated peripheral eosinophils  We will restart him on Breo inhaler.   There is no evidence of interstitial lung disease.  There is very minimal reticulation at the bases which is not very concerning Suspect a component of upper airway cough syndrome.  Continue Prilosec twice daily, Flonase nasal spray Start chlorpheniramine 8 mg 3 times daily.  Subcentimeter pulmonary nodule 81m right lower lobe subpleural nodule noted.  Follow-up CT in 1 year.  Suspected sleep apnea Suggested a sleep study.  [facility] not scheduled yet.    Plan/Recommendations: - Chlorpheniramine, continue Flonase, Prilosec - Start Breo - CT follow up of lung nodule.  PMarshell GarfinkelMD [facility] 02/22/2018, 9:12 AM  CC: JBerkley Harvey NP

## 2018-08-10 ENCOUNTER — Other Ambulatory Visit: Payer: Self-pay

## 2018-08-10 DIAGNOSIS — Z20822 Contact with and (suspected) exposure to covid-19: Secondary | ICD-10-CM

## 2018-08-11 LAB — NOVEL CORONAVIRUS, NAA: SARS-CoV-2, NAA: NOT DETECTED

## 2018-09-06 ENCOUNTER — Ambulatory Visit (INDEPENDENT_AMBULATORY_CARE_PROVIDER_SITE_OTHER)
Admission: RE | Admit: 2018-09-06 | Discharge: 2018-09-06 | Disposition: A | Discharge status: Home / Self Care | Payer: Medicare HMO | Source: Ambulatory Visit | Attending: Pulmonary Disease | Admitting: Pulmonary Disease

## 2018-09-06 ENCOUNTER — Other Ambulatory Visit: Payer: Self-pay

## 2018-09-06 DIAGNOSIS — R918 Other nonspecific abnormal finding of lung field: Secondary | ICD-10-CM | POA: Diagnosis not present

## 2018-09-19 ENCOUNTER — Other Ambulatory Visit: Payer: Self-pay | Admitting: *Deleted

## 2018-09-19 ENCOUNTER — Telehealth: Payer: Self-pay | Admitting: *Deleted

## 2018-09-19 DIAGNOSIS — J449 Chronic obstructive pulmonary disease, unspecified: Secondary | ICD-10-CM

## 2018-09-19 NOTE — Telephone Encounter (Signed)
Please schedule patient for B&A spiro/dlco then f/u with Dr.Mannam. Ordered in Feb 2020.

## 2018-09-22 NOTE — Telephone Encounter (Signed)
Spoke to pt wife - request a call back tomorrow to schedule -pr

## 2018-09-23 NOTE — Telephone Encounter (Signed)
Scheduled pft on 10/20/2018-pr

## 2018-10-06 ENCOUNTER — Other Ambulatory Visit: Payer: Self-pay | Admitting: Pulmonary Disease

## 2018-10-14 ENCOUNTER — Other Ambulatory Visit (HOSPITAL_COMMUNITY): Payer: Medicare HMO

## 2018-10-14 ENCOUNTER — Other Ambulatory Visit (HOSPITAL_COMMUNITY): Admission: RE | Admit: 2018-10-14 | Payer: Medicare HMO | Source: Ambulatory Visit

## 2018-10-17 ENCOUNTER — Other Ambulatory Visit (HOSPITAL_COMMUNITY)
Admission: RE | Admit: 2018-10-17 | Discharge: 2018-10-17 | Disposition: A | Discharge status: Home / Self Care | Payer: Medicare HMO | Source: Ambulatory Visit | Attending: Pulmonary Disease | Admitting: Pulmonary Disease

## 2018-10-17 DIAGNOSIS — Z01812 Encounter for preprocedural laboratory examination: Secondary | ICD-10-CM | POA: Diagnosis present

## 2018-10-17 DIAGNOSIS — Z20828 Contact with and (suspected) exposure to other viral communicable diseases: Secondary | ICD-10-CM | POA: Diagnosis not present

## 2018-10-17 LAB — SARS CORONAVIRUS 2 (TAT 6-24 HRS): SARS Coronavirus 2: NEGATIVE

## 2018-10-20 ENCOUNTER — Encounter: Payer: Self-pay | Admitting: Pulmonary Disease

## 2018-10-20 ENCOUNTER — Other Ambulatory Visit: Payer: Self-pay

## 2018-10-20 ENCOUNTER — Ambulatory Visit: Payer: Medicare HMO | Admitting: Pulmonary Disease

## 2018-10-20 ENCOUNTER — Ambulatory Visit (INDEPENDENT_AMBULATORY_CARE_PROVIDER_SITE_OTHER): Payer: Medicare HMO | Admitting: Pulmonary Disease

## 2018-10-20 VITALS — BP 122/68 | HR 90 | Ht 67.0 in | Wt 331.0 lb

## 2018-10-20 DIAGNOSIS — G4733 Obstructive sleep apnea (adult) (pediatric): Secondary | ICD-10-CM

## 2018-10-20 DIAGNOSIS — R0602 Shortness of breath: Secondary | ICD-10-CM

## 2018-10-20 DIAGNOSIS — J449 Chronic obstructive pulmonary disease, unspecified: Secondary | ICD-10-CM

## 2018-10-20 LAB — PULMONARY FUNCTION TEST
DL/VA % pred: 106 %
DL/VA: 4.4 ml/min/mmHg/L
DLCO unc % pred: 109 %
DLCO unc: 26.05 ml/min/mmHg
FEF 25-75 Post: 2.39 L/sec
FEF 25-75 Pre: 1.9 L/sec
FEF2575-%Change-Post: 25 %
FEF2575-%Pred-Post: 103 %
FEF2575-%Pred-Pre: 82 %
FEV1-%Change-Post: 4 %
FEV1-%Pred-Post: 93 %
FEV1-%Pred-Pre: 89 %
FEV1-Post: 2.76 L
FEV1-Pre: 2.64 L
FEV1FVC-%Change-Post: 1 %
FEV1FVC-%Pred-Pre: 100 %
FEV6-%Change-Post: 3 %
FEV6-%Pred-Post: 96 %
FEV6-%Pred-Pre: 92 %
FEV6-Post: 3.65 L
FEV6-Pre: 3.51 L
FEV6FVC-%Change-Post: 0 %
FEV6FVC-%Pred-Post: 106 %
FEV6FVC-%Pred-Pre: 105 %
FVC-%Change-Post: 3 %
FVC-%Pred-Post: 91 %
FVC-%Pred-Pre: 88 %
FVC-Post: 3.65 L
FVC-Pre: 3.54 L
Post FEV1/FVC ratio: 76 %
Post FEV6/FVC ratio: 100 %
Pre FEV1/FVC ratio: 75 %
Pre FEV6/FVC Ratio: 99 %
RV % pred: 99 %
RV: 2.22 L
TLC % pred: 91 %
TLC: 5.88 L

## 2018-10-20 MED ORDER — BREO ELLIPTA 200-25 MCG/INH IN AEPB: 1.0000 | INHALATION_SPRAY | Freq: Every day | RESPIRATORY_TRACT | 0 refills | Status: DC

## 2018-10-20 MED ORDER — BREO ELLIPTA 200-25 MCG/INH IN AEPB: 1.0000 | INHALATION_SPRAY | Freq: Every day | RESPIRATORY_TRACT | 3 refills | Status: DC

## 2018-10-20 NOTE — Progress Notes (Signed)
[name] [name]    [id number]    11-26-1950  Primary Care Physician:Jones, Sherrill Raring, NP  Referring Physician: Berkley Harvey, NP [place],  [place] [address]  Chief complaint: Follow up for chronic cough, mild COPD, asthma  HPI: 68 year old with history of diabetes, chronic kidney disease, chronic cough, hypertension Complains of cough for many years, occasionally productive.  No wheezing, dyspnea He has been treated with his primary care with albuterol inhaler and Protonix with no improvement in symptoms Has occasional GERD symptoms, denies any seasonal allergies, postnasal drip  Pets: Cat, dog.  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  No mold, hot tub, Jacuzzi. Smoking history: 20-30-pack-year smoker.  Quit in 2003  Travel history: Lived in [place], [place], [place] Relevant family history: No significant family history of lung disease.  Interim history Given Breo sample earlier this year.  He felt that his breathing was better on it but has not renewed it  Had COVID 19 infection in July.  He self isolated at home and did not need any treatment or admission States that his dyspnea on exertion had worsened since this episode.  Denies any cough, sputum production, fevers, chills  Outpatient Encounter Medications as of 10/20/2018  Medication Sig  . albuterol (PROVENTIL HFA;VENTOLIN HFA) 108 (90 Base) MCG/ACT inhaler Inhale 1 puff into the lungs as needed.  Marland Kitchen aspirin EC 81 MG tablet Take 1 tablet by mouth daily.  Marland Kitchen atorvastatin (LIPITOR) 20 MG tablet Take 20 mg by mouth daily.  . Blood Glucose Monitoring Suppl (FIFTY50 GLUCOSE METER 2.0) w/Device KIT Use as instructed  . calcium-vitamin D (CALCIUM 500/D) 500-200 MG-UNIT tablet Take 1 tablet by  mouth daily.  . chlorpheniramine (CHLOR-TRIMETON) 4 MG tablet Take 2 tablets (8 mg total) by mouth 3 (three) times daily.  . fluticasone (FLONASE) 50 MCG/ACT nasal spray Place 2 sprays into both nostrils daily.  . Lancets (ACCU-CHEK MULTICLIX) lancets   . losartan-hydrochlorothiazide (HYZAAR) 50-12.5 MG tablet Take 1 tablet by mouth daily.  . meloxicam (MOBIC) 15 MG tablet TAKE 1 TABLET BY MOUTH ONCE DAILY FOR 10 DAYS THEN AS NEEDED  . metFORMIN (GLUCOPHAGE) 500 MG tablet Take 1 tablet by mouth daily.  . montelukast (SINGULAIR) 10 MG tablet   . Multiple Vitamin (MULTI-VITAMINS) TABS Take 1 tablet by mouth daily.  . Omega-3 Fatty Acids (SM FISH OIL) 1000 MG CAPS Take 1 capsule by mouth 2 (two) times daily.  [name] test strip USE 1 STRIP TO CHECK GLUCOSE ONCE DAILY FOR BLOOD SUGAR  . pantoprazole (PROTONIX) 20 MG tablet Take 1 tablet (20 mg total) by mouth 2 (two) times daily.  . sitaGLIPtin (JANUVIA) 50 MG tablet Take 50 mg by mouth daily.  Marland Kitchen telmisartan-hydrochlorothiazide (MICARDIS HCT) 40-12.5 MG tablet Take 1 tablet by mouth daily.   Facility-Administered Encounter Medications as of 10/20/2018  Medication  . 0.9 %  sodium chloride infusion   Physical Exam: Blood pressure 122/68, pulse 90, height 5' 7"  (1.702 m), weight (!) 331 lb (150.1 kg), SpO2 96 %. Gen:      No acute distress HEENT:  EOMI, sclera anicteric Neck:     No masses; no thyromegaly Lungs:  Clear to auscultation bilaterally; normal respiratory effort CV:         Regular rate and rhythm; no murmurs Abd:      + bowel sounds; soft, non-tender; no palpable masses, no distension Ext:    No edema; adequate peripheral perfusion Skin:      Warm and dry; no rash Neuro: alert and oriented x 3 Psych: normal mood and affect  Data Reviewed: Imaging Chest x-ray 05/06/2017- stable cardiomegaly, mild basilar atelectasis/scarring CT high-resolution 08/20/2017- minimal bibasal predominant reticulation.  Mild emphysema, 5 mm  right lower lobe subpleural nodule. CT chest 09/06/2018- stable 4 mm lung nodule.  Mild progression of interstitial lung disease, mild emphysema.  I have reviewed the images personally.  PFTs  09/14/2017 FVC 3.71 [91%), FEV1 2.76 [92%], F/F 76, TLC 7.52 (117%), DLCO 30.25 (106%) Minimal obstructive airway disease.  10/20/2018 FVC 3.65 [91%] FEV1 2.76 [93%], F/F 76, TLC 5.88 [91%], DLCO 26.05 [-6%] Minimal obstructive airway disease  FENO 08/11/2017-15  Labs: CBC 12/14/2008-WBC 9.7, eos 3%, absolute eosinophil count 291 CBC 12/20/2017-WBC 10.9, eos 4.1%, absolute eosinophil count 447 IgE 12/20/2017-4 Alpha-1 antitrypsin 02/20/2017-157, PIMM  Assessment:  COPD, asthma PFTs reviewed with no overt obstruction however there is curvature to the flow loop suggestive of small airway disease.  He does have mild emphysema on CT.  Blood work noted for elevated peripheral eosinophils  We will restart him on Breo inhaler.   Concern for interstitial lung disease CT from September shows slight progression in interstitial opacities.  There is also worsened lung volumes and diffusion capacity on PFTs He has a bout of COVID-19 in July Get a high-res CT to evaluate pulmonary fibrosis.  Cough Suspect a component of upper airway cough syndrome.  Continue Prilosec twice daily, Flonase nasal spray Phentermine PRN  Subcentimeter pulmonary nodule 30m right lower lobe subpleural nodule noted.  Follow-up CT in 1 year.  Suspected sleep apnea Suggested a sleep study.  [facility] [place] [facility] We will order home sleep study  Plan/Recommendations: - Start Breo - High-res CT - Home sleep study  PMarshell GarfinkelMD [facility] 10/20/2018, 1:55 PM  CC: JBerkley Harvey NP

## 2018-10-20 NOTE — Progress Notes (Signed)
PFT done today. 

## 2018-10-20 NOTE — Patient Instructions (Addendum)
I am sorry have not recovered yet from your COVID-19 Your lung function test show slight decrease in your lung capacity We will get a high-resolution CT for better evaluation We will also start you on Breo inhaler We will schedule you for a home sleep study.  Follow-up in 1 to 2 months.

## 2018-11-03 ENCOUNTER — Other Ambulatory Visit: Payer: Self-pay

## 2018-11-03 DIAGNOSIS — G4733 Obstructive sleep apnea (adult) (pediatric): Secondary | ICD-10-CM

## 2018-11-04 DIAGNOSIS — G4733 Obstructive sleep apnea (adult) (pediatric): Secondary | ICD-10-CM

## 2018-11-08 ENCOUNTER — Telehealth: Payer: Self-pay

## 2018-11-08 DIAGNOSIS — G4733 Obstructive sleep apnea (adult) (pediatric): Secondary | ICD-10-CM

## 2018-11-08 NOTE — Telephone Encounter (Signed)
-----   Message from Marshell Garfinkel, MD sent at 11/08/2018 10:37 AM EST ----- Regarding: CPAP titration Thanks  Danae Chen- can you call and inform pt. Order inlab CPAP titration study. ----- Message ----- From: Chesley Mires, MD Sent: 11/08/2018   8:39 AM EST To: Marshell Garfinkel, MD  Praveen,  Home sleep study from 11/04/18 showed moderate OSA with AHI 22 and SpO2 low 78%.  Did spend 122.7 minutes of test time with SpO2 < 89%.  Probably needs in lab CPAP titration study.  Thanks.  Vineet

## 2018-11-08 NOTE — Telephone Encounter (Signed)
LMTCB x 1 

## 2018-11-11 NOTE — Telephone Encounter (Signed)
LMTCB x2  

## 2018-11-16 ENCOUNTER — Other Ambulatory Visit: Payer: Self-pay

## 2018-11-16 ENCOUNTER — Ambulatory Visit (INDEPENDENT_AMBULATORY_CARE_PROVIDER_SITE_OTHER)
Admission: RE | Admit: 2018-11-16 | Discharge: 2018-11-16 | Disposition: A | Discharge status: Home / Self Care | Payer: Medicare HMO | Source: Ambulatory Visit | Attending: Pulmonary Disease | Admitting: Pulmonary Disease

## 2018-11-16 DIAGNOSIS — R0602 Shortness of breath: Secondary | ICD-10-CM

## 2018-11-16 DIAGNOSIS — J449 Chronic obstructive pulmonary disease, unspecified: Secondary | ICD-10-CM

## 2018-11-16 NOTE — Telephone Encounter (Signed)
LMTCB x3. A letter will be mailed.

## 2018-11-16 NOTE — Telephone Encounter (Signed)
I have sent an unable to contact letter via my chart to patient. I will place an order to the CPAP titration study.

## 2018-12-07 ENCOUNTER — Encounter: Payer: Self-pay | Admitting: Pulmonary Disease

## 2018-12-07 ENCOUNTER — Ambulatory Visit (INDEPENDENT_AMBULATORY_CARE_PROVIDER_SITE_OTHER): Payer: Medicare HMO | Admitting: Pulmonary Disease

## 2018-12-07 ENCOUNTER — Other Ambulatory Visit: Payer: Self-pay

## 2018-12-07 DIAGNOSIS — J449 Chronic obstructive pulmonary disease, unspecified: Secondary | ICD-10-CM

## 2018-12-07 DIAGNOSIS — G4733 Obstructive sleep apnea (adult) (pediatric): Secondary | ICD-10-CM

## 2018-12-07 NOTE — Progress Notes (Signed)
Virtual Visit via Telephone Note  I connected with [name] [name] on 12/07/18 at 10:00 AM EST by telephone and verified that I am speaking with the correct person using two identifiers.  Location: Patient: Home Provider: [facility], [place], [place]   I discussed the limitations, risks, security and privacy concerns of performing an evaluation and management service by telephone and the availability of in person appointments. I also discussed with the patient that there may be a patient responsible charge related to this service. The patient expressed understanding and agreed to proceed.   History of Present Illness: Follow up for chronic cough, mild COPD, asthma   68 year old with history of diabetes, chronic kidney disease, chronic cough, hypertension  Follow up for chronic cough, mild COPD, asthma  Had COVID 19 infection in July.  He self isolated at home and did not need any treatment or admission   Observations/Objective: Had a follow-up CT scan and is here for review States that his breathing is doing well He ran out of [employer] recently and has not received a refill Also had a sleep study which showed moderate sleep apnea with desats  High-res CT 11/16/2018-mild emphysema, very minimal subpleural reticular densities.  Improvement in groundglass opacities with residual scarring in the left upper lobes.  Mild air trapping.  Home sleep study 12/05/2018-moderate sleep apnea with AHI 22, oxygen desaturations to 72%  Assessment and Plan: COPD, asthma Continue Breo inhaler.  He will call for a refill as he ran out of medication recently  Interstitial lung disease, COVID-19 infection earlier this year High-res CT reviewed with improvement in groundglass opacities.  He has minimal residual reticulation at the bases and nonspecific pattern which is unchanged from previous scans in 2019.  We will continue to follow this  Sleep apnea Home sleep study reviewed with  moderate sleep apnea, desats Is scheduled for CPAP titration next week.  Follow Up Instructions: -Restart Breo - CPAP titration   I discussed the assessment and treatment plan with the patient. The patient was provided an opportunity to ask questions and all were answered. The patient agreed with the plan and demonstrated an understanding of the instructions.   The patient was advised to call back or seek an in-person evaluation if the symptoms worsen or if the condition fails to improve as anticipated.  I provided 25 minutes of non-face-to-face time during this encounter.   Marshell Garfinkel MD  Pulmonary and Critical Care 12/07/2018, 10:28 AM

## 2018-12-07 NOTE — Patient Instructions (Signed)
I have reviewed the CT scan which shows that the COVID-19 pneumonia has improved Please proceed with a CPAP titration study next week Resume Breo inhaler  Follow-up in 3 months.

## 2018-12-09 ENCOUNTER — Other Ambulatory Visit (HOSPITAL_COMMUNITY)
Admission: RE | Admit: 2018-12-09 | Discharge: 2018-12-09 | Disposition: A | Discharge status: Home / Self Care | Payer: Medicare HMO | Source: Ambulatory Visit | Attending: Internal Medicine | Admitting: Internal Medicine

## 2018-12-09 DIAGNOSIS — Z20828 Contact with and (suspected) exposure to other viral communicable diseases: Secondary | ICD-10-CM | POA: Diagnosis not present

## 2018-12-09 DIAGNOSIS — Z01812 Encounter for preprocedural laboratory examination: Secondary | ICD-10-CM | POA: Insufficient documentation

## 2018-12-09 LAB — SARS CORONAVIRUS 2 (TAT 6-24 HRS): SARS Coronavirus 2: NEGATIVE

## 2018-12-12 ENCOUNTER — Ambulatory Visit (HOSPITAL_BASED_OUTPATIENT_CLINIC_OR_DEPARTMENT_OTHER): Payer: Medicare HMO | Attending: Pulmonary Disease | Admitting: Pulmonary Disease

## 2018-12-12 ENCOUNTER — Other Ambulatory Visit: Payer: Self-pay

## 2018-12-12 DIAGNOSIS — G4733 Obstructive sleep apnea (adult) (pediatric): Secondary | ICD-10-CM | POA: Insufficient documentation

## 2018-12-12 DIAGNOSIS — J449 Chronic obstructive pulmonary disease, unspecified: Secondary | ICD-10-CM | POA: Diagnosis not present

## 2018-12-12 DIAGNOSIS — J849 Interstitial pulmonary disease, unspecified: Secondary | ICD-10-CM | POA: Diagnosis not present

## 2018-12-13 DIAGNOSIS — G4733 Obstructive sleep apnea (adult) (pediatric): Secondary | ICD-10-CM

## 2018-12-13 NOTE — Procedures (Signed)
    Patient Name: [name] [name], [name] [name] Date: 12/12/2018 Gender: Male D.O.B: Oct 13, 1950 Age (years): 37 Referring Provider: Marshell Garfinkel Height (inches): 63 Interpreting Physician: Chesley Mires MD, ABSM Weight (lbs): 320 RPSGT: Gwenyth Allegra BMI: 45 MRN: [id number] Neck Size: 21.00  CLINICAL INFORMATION 68 year old male with history of COPD, and ILD.  Had home sleep study from 12/05/2018 that showed an AHI of 22 with SpO2 low of 72%.  He is he referred for a BiPAP titration to treat sleep apnea.  SLEEP STUDY TECHNIQUE As per the AASM Manual for the Scoring of Sleep and Associated Events v2.3 (April 2016) with a hypopnea requiring 4% desaturations.  The channels recorded and monitored were frontal, central and occipital EEG, electrooculogram (EOG), submentalis EMG (chin), nasal and oral airflow, thoracic and abdominal wall motion, anterior tibialis EMG, snore microphone, electrocardiogram, and pulse oximetry. Bilevel positive airway pressure (BPAP) was initiated at the beginning of the study and titrated to treat sleep-disordered breathing.  MEDICATIONS Medications self-administered by patient taken the night of the study : N/A  RESPIRATORY PARAMETERS Optimal IPAP Pressure (cm): 21 AHI at Optimal Pressure (/hr) 0.0 Optimal EPAP Pressure (cm): 17  Overall Minimal O2 (%): 67.0 Minimal O2 at Optimal Pressure (%): 89.0  He was started on CPAP 7 and increase to 18 cm H2O.  He continued to have obstructive respiratory events.  He as transitioned to Bipap.  Once on Bipap 21/17 cm H2O he had good control of respiratory events.  He was observed in REM and supine sleep at this pressure setting.  He did not require the use of supplemental oxygen during this study.  SLEEP ARCHITECTURE Start Time: 9:56:59 PM Stop Time: 4:29:56 AM Total Time (min): 392.9 Total Sleep Time (min): 317.5 Sleep Latency (min): 4.2 Sleep Efficiency (%): 80.8% REM Latency (min): 100.0 WASO (min): 71.2 Stage N1  (%): 3.1% Stage N2 (%): 72.8% Stage N3 (%): 0.0% Stage R (%): 24.1 Supine (%): 56.54 Arousal Index (/hr): 8.5   CARDIAC DATA The 2 lead EKG demonstrated sinus rhythm. The mean heart rate was 87.2 beats per minute. Other EKG findings include: PVCs.  LEG MOVEMENT DATA The total Periodic Limb Movements of Sleep (PLMS) were 0. The PLMS index was 0.0. A PLMS index of <15 is considered normal in adults.  IMPRESSIONS - He was tried on CPAP from 5 to 18 cm H2O without improvement in obstructive sleep apnea.  CPAP failed to adequately control his obstructive sleep apnea. - He was transitioned to BiPAP.  He did well with BiPAP 21/17 cm H2O.   - He did not require the use of supplemental oxygen during this study.  DIAGNOSIS - Obstructive Sleep Apnea (327.23 [G47.33 ICD-10])  RECOMMENDATIONS - Trial of BiPAP therapy on 21/17 cm H2O with a Large size Fisher&Paykel Full Face Mask Simplus mask and heated humidification.  [Electronically signed] 12/13/2018 03:46 PM  Chesley Mires MD, [place], American Board of Sleep Medicine   NPI: [id number]

## 2018-12-14 ENCOUNTER — Telehealth: Payer: Self-pay

## 2018-12-14 DIAGNOSIS — G4733 Obstructive sleep apnea (adult) (pediatric): Secondary | ICD-10-CM

## 2018-12-14 NOTE — Telephone Encounter (Signed)
-----   Message from Marshell Garfinkel, MD sent at 12/14/2018  8:37 AM EST ----- I have reviewed the CPAP titration study  Please order BiPAP therapy on 21/17 cm H2O with a Large size Fisher&Paykel Full Face Mask Simplus mask and heated humidification. Follow up in 3 months to review download  ----- Message ----- From: Chesley Mires, MD Sent: 12/13/2018   3:48 PM EST To: Marshell Garfinkel, MD

## 2018-12-14 NOTE — Telephone Encounter (Signed)
I called and spoke with the patient and made him aware of his results and recommendations. I will place an order for his BIPAP and I have placed a recall for his 3 month follow up.

## 2019-01-27 DIAGNOSIS — J309 Allergic rhinitis, unspecified: Secondary | ICD-10-CM | POA: Insufficient documentation

## 2019-01-27 DIAGNOSIS — M1712 Unilateral primary osteoarthritis, left knee: Secondary | ICD-10-CM | POA: Insufficient documentation

## 2019-04-21 ENCOUNTER — Other Ambulatory Visit (HOSPITAL_COMMUNITY): Payer: Medicare HMO

## 2019-04-24 ENCOUNTER — Ambulatory Visit: Payer: Self-pay | Admitting: General Surgery

## 2019-04-26 ENCOUNTER — Encounter (HOSPITAL_BASED_OUTPATIENT_CLINIC_OR_DEPARTMENT_OTHER): Payer: Self-pay | Admitting: General Surgery

## 2019-04-26 ENCOUNTER — Other Ambulatory Visit: Payer: Self-pay

## 2019-04-26 NOTE — Progress Notes (Addendum)
ADDENDUM:  Chart reviewed by anesthesia, Konrad Felix PA, stated as long as COPD/ cough is stable ok to proceed.   Spoke w/ via phone for pre-op interview--- PT Lab needs dos----  Istat 8 and EKG             Lab results------ current chest CT in epic/ chart COVID test ------ 04-27-2019 @ 1430  Arrive at ------- 1130 NPO after ------ MN w/ exception clear liquids (diet) until 1030 then nothing by mouth (no cream/ milk products) Medications to take morning of surgery ----- Protonix w/ sips of water Diabetic medication ----- do not take metformin / januvia morning of surgery  Patient Special Instructions ----- bring rescue inhaler and bipap/ mask/ tubing with him dos Pre-Op special Istructions ----- n/a Patient verbalized understanding of instructions that were given at this phone interview. Patient denies , chest pain, fever at this phone interview.    Anesthesia Review:  Hx COPD/ ILD/ asthma; hx covid 07/ 2020 residual stated difficulty breathing with activity since; HTN, DM2, OSA w/ bipap, chronic cough (occasionally productive).  Pt prescribed breo inhaler daily by Dr Vaughan Browner, per [facility] note, pt given instructions to refill breo and continue.  However, pt stated had not refilled breo.  Pt advised to call his pharmacy and / or Dr Vaughan Browner office today to get refilled and start inhaler as soon as he can. Chart to be reviewed by Konrad Felix PA.  PCP: Eldridge Abrahams FNP (lov 01-25-2019 epic) Cardiologist : no Pulmonology:  Dr Bernita Raisin (lov 12-07-2018 epic)  PFTs 10-20-2018 epic  Chest x-ray : Chest CT 11-16-2018 epic EKG : 05-06-2017 care everywhere/  12-04-2008 epic Echo : no Cardiac Cath :  Sleep Study/ CPAP :  YES/  BiPap Fasting Blood Sugar :      / Checks Blood Sugar -- times a day:   Pt stated in process of getting new machine, current machine broken  Blood Thinner/ Instructions Maryjane Hurter Dose:  NO ASA / Instructions/ Last Dose :  ASA 81mg /  Pt stated was not given any instructions  from Dr Kieth Brightly office.  Pt advised to call office and speak with triage nurse to get instructions about asa

## 2019-04-27 ENCOUNTER — Other Ambulatory Visit (HOSPITAL_COMMUNITY)
Admission: RE | Admit: 2019-04-27 | Discharge: 2019-04-27 | Disposition: A | Discharge status: Home / Self Care | Payer: Medicare HMO | Source: Ambulatory Visit | Attending: General Surgery | Admitting: General Surgery

## 2019-04-27 DIAGNOSIS — Z01812 Encounter for preprocedural laboratory examination: Secondary | ICD-10-CM | POA: Diagnosis present

## 2019-04-27 DIAGNOSIS — Z20822 Contact with and (suspected) exposure to covid-19: Secondary | ICD-10-CM | POA: Insufficient documentation

## 2019-04-27 LAB — SARS CORONAVIRUS 2 (TAT 6-24 HRS): SARS Coronavirus 2: NEGATIVE

## 2019-05-01 ENCOUNTER — Other Ambulatory Visit: Payer: Self-pay

## 2019-05-01 ENCOUNTER — Ambulatory Visit (HOSPITAL_BASED_OUTPATIENT_CLINIC_OR_DEPARTMENT_OTHER): Payer: Medicare HMO | Admitting: Physician Assistant

## 2019-05-01 ENCOUNTER — Ambulatory Visit (HOSPITAL_BASED_OUTPATIENT_CLINIC_OR_DEPARTMENT_OTHER)
Admission: RE | Admit: 2019-05-01 | Discharge: 2019-05-01 | Disposition: A | Discharge status: Home / Self Care | Payer: Medicare HMO | Attending: General Surgery | Admitting: General Surgery

## 2019-05-01 ENCOUNTER — Encounter (HOSPITAL_BASED_OUTPATIENT_CLINIC_OR_DEPARTMENT_OTHER): Payer: Self-pay | Admitting: General Surgery

## 2019-05-01 ENCOUNTER — Encounter (HOSPITAL_BASED_OUTPATIENT_CLINIC_OR_DEPARTMENT_OTHER)
Admission: RE | Disposition: A | Discharge status: Home / Self Care | Payer: Self-pay | Source: Home / Self Care | Attending: General Surgery

## 2019-05-01 DIAGNOSIS — Z6841 Body Mass Index (BMI) 40.0 and over, adult: Secondary | ICD-10-CM | POA: Diagnosis not present

## 2019-05-01 DIAGNOSIS — Z8616 Personal history of COVID-19: Secondary | ICD-10-CM | POA: Diagnosis not present

## 2019-05-01 DIAGNOSIS — G4733 Obstructive sleep apnea (adult) (pediatric): Secondary | ICD-10-CM | POA: Diagnosis not present

## 2019-05-01 DIAGNOSIS — J449 Chronic obstructive pulmonary disease, unspecified: Secondary | ICD-10-CM | POA: Insufficient documentation

## 2019-05-01 DIAGNOSIS — K219 Gastro-esophageal reflux disease without esophagitis: Secondary | ICD-10-CM | POA: Diagnosis not present

## 2019-05-01 DIAGNOSIS — Z791 Long term (current) use of non-steroidal anti-inflammatories (NSAID): Secondary | ICD-10-CM | POA: Insufficient documentation

## 2019-05-01 DIAGNOSIS — K8010 Calculus of gallbladder with chronic cholecystitis without obstruction: Secondary | ICD-10-CM | POA: Diagnosis not present

## 2019-05-01 DIAGNOSIS — Z87891 Personal history of nicotine dependence: Secondary | ICD-10-CM | POA: Insufficient documentation

## 2019-05-01 DIAGNOSIS — I10 Essential (primary) hypertension: Secondary | ICD-10-CM | POA: Diagnosis not present

## 2019-05-01 DIAGNOSIS — Z79899 Other long term (current) drug therapy: Secondary | ICD-10-CM | POA: Diagnosis not present

## 2019-05-01 DIAGNOSIS — E119 Type 2 diabetes mellitus without complications: Secondary | ICD-10-CM | POA: Insufficient documentation

## 2019-05-01 DIAGNOSIS — Z8249 Family history of ischemic heart disease and other diseases of the circulatory system: Secondary | ICD-10-CM | POA: Diagnosis not present

## 2019-05-01 DIAGNOSIS — Z7984 Long term (current) use of oral hypoglycemic drugs: Secondary | ICD-10-CM | POA: Insufficient documentation

## 2019-05-01 DIAGNOSIS — Z7982 Long term (current) use of aspirin: Secondary | ICD-10-CM | POA: Insufficient documentation

## 2019-05-01 DIAGNOSIS — E785 Hyperlipidemia, unspecified: Secondary | ICD-10-CM | POA: Diagnosis not present

## 2019-05-01 HISTORY — PX: CHOLECYSTECTOMY: SHX55

## 2019-05-01 HISTORY — DX: Chronic obstructive pulmonary disease, unspecified: J44.9

## 2019-05-01 HISTORY — DX: Personal history of COVID-19: Z86.16

## 2019-05-01 HISTORY — DX: Chronic cough: R05.3

## 2019-05-01 HISTORY — DX: Interstitial pulmonary disease, unspecified: J84.9

## 2019-05-01 HISTORY — DX: Type 2 diabetes mellitus without complications: E11.9

## 2019-05-01 HISTORY — DX: Gastro-esophageal reflux disease without esophagitis: K21.9

## 2019-05-01 HISTORY — DX: Other abnormalities of breathing: R06.89

## 2019-05-01 HISTORY — DX: Presence of spectacles and contact lenses: Z97.3

## 2019-05-01 HISTORY — DX: Solitary pulmonary nodule: R91.1

## 2019-05-01 HISTORY — DX: Complete loss of teeth, unspecified cause, unspecified class: K08.109

## 2019-05-01 HISTORY — DX: Calculus of gallbladder without cholecystitis without obstruction: K80.20

## 2019-05-01 HISTORY — DX: Polyneuropathy, unspecified: G62.9

## 2019-05-01 HISTORY — DX: Diaphragmatic hernia without obstruction or gangrene: K44.9

## 2019-05-01 HISTORY — DX: Obstructive sleep apnea (adult) (pediatric): G47.33

## 2019-05-01 LAB — POCT I-STAT, CHEM 8
BUN: 19 mg/dL (ref 8–23)
Calcium, Ion: 1.3 mmol/L (ref 1.15–1.40)
Chloride: 106 mmol/L (ref 98–111)
Creatinine, Ser: 1 mg/dL (ref 0.61–1.24)
Glucose, Bld: 166 mg/dL — ABNORMAL HIGH (ref 70–99)
HCT: 40 % (ref 39.0–52.0)
Hemoglobin: 13.6 g/dL (ref 13.0–17.0)
Potassium: 4.3 mmol/L (ref 3.5–5.1)
Sodium: 142 mmol/L (ref 135–145)
TCO2: 23 mmol/L (ref 22–32)

## 2019-05-01 LAB — GLUCOSE, CAPILLARY: Glucose-Capillary: 168 mg/dL — ABNORMAL HIGH (ref 70–99)

## 2019-05-01 SURGERY — LAPAROSCOPIC CHOLECYSTECTOMY
Anesthesia: General | Site: Abdomen

## 2019-05-01 MED ORDER — OXYCODONE HCL 5 MG PO TABS
5.0000 mg | ORAL_TABLET | Freq: Four times a day (QID) | ORAL | 0 refills | Status: DC | PRN
Start: 2019-05-01 — End: 2021-05-06

## 2019-05-01 MED ORDER — SUCCINYLCHOLINE CHLORIDE 20 MG/ML IJ SOLN
INTRAMUSCULAR | Status: DC | PRN
  Administered 2019-05-01: 180 mg via INTRAVENOUS

## 2019-05-01 MED ORDER — DEXAMETHASONE SODIUM PHOSPHATE 10 MG/ML IJ SOLN
INTRAMUSCULAR | Status: AC
  Filled 2019-05-01: qty 1

## 2019-05-01 MED ORDER — CEFAZOLIN SODIUM-DEXTROSE 2-4 GM/100ML-% IV SOLN
INTRAVENOUS | Status: AC
  Filled 2019-05-01: qty 100

## 2019-05-01 MED ORDER — ROCURONIUM BROMIDE 10 MG/ML (PF) SYRINGE
PREFILLED_SYRINGE | INTRAVENOUS | Status: AC
  Filled 2019-05-01: qty 10

## 2019-05-01 MED ORDER — ONDANSETRON HCL 4 MG/2ML IJ SOLN
INTRAMUSCULAR | Status: AC
  Filled 2019-05-01: qty 2

## 2019-05-01 MED ORDER — LACTATED RINGERS IV SOLN: INTRAVENOUS | Status: DC

## 2019-05-01 MED ORDER — HYDROMORPHONE HCL 1 MG/ML IJ SOLN
0.2500 mg | INTRAMUSCULAR | Status: DC | PRN
  Administered 2019-05-01 (×2): 0.25 mg via INTRAVENOUS

## 2019-05-01 MED ORDER — FENTANYL CITRATE (PF) 250 MCG/5ML IJ SOLN
INTRAMUSCULAR | Status: AC
  Filled 2019-05-01: qty 5

## 2019-05-01 MED ORDER — SODIUM CHLORIDE 0.9 % IR SOLN
Status: DC | PRN
  Administered 2019-05-01: 3000 mL

## 2019-05-01 MED ORDER — MIDAZOLAM HCL 5 MG/5ML IJ SOLN
INTRAMUSCULAR | Status: DC | PRN
  Administered 2019-05-01: 1 mg via INTRAVENOUS

## 2019-05-01 MED ORDER — ACETAMINOPHEN 500 MG PO TABS
ORAL_TABLET | ORAL | Status: AC
  Filled 2019-05-01: qty 2

## 2019-05-01 MED ORDER — LIDOCAINE HCL (CARDIAC) PF 100 MG/5ML IV SOSY
PREFILLED_SYRINGE | INTRAVENOUS | Status: DC | PRN
  Administered 2019-05-01: 100 mg via INTRAVENOUS

## 2019-05-01 MED ORDER — KETOROLAC TROMETHAMINE 15 MG/ML IJ SOLN: 15.0000 mg | INTRAMUSCULAR | Status: DC

## 2019-05-01 MED ORDER — HYDROMORPHONE HCL 1 MG/ML IJ SOLN
INTRAMUSCULAR | Status: AC
  Filled 2019-05-01: qty 1

## 2019-05-01 MED ORDER — ACETAMINOPHEN 500 MG PO TABS
1000.0000 mg | ORAL_TABLET | ORAL | Status: AC
  Administered 2019-05-01: 11:00:00 1000 mg via ORAL

## 2019-05-01 MED ORDER — PROPOFOL 10 MG/ML IV BOLUS
INTRAVENOUS | Status: DC | PRN
  Administered 2019-05-01: 160 mg via INTRAVENOUS

## 2019-05-01 MED ORDER — BUPIVACAINE HCL 0.5 % IJ SOLN
INTRAMUSCULAR | Status: DC | PRN
  Administered 2019-05-01: 30 mL

## 2019-05-01 MED ORDER — SUGAMMADEX SODIUM 500 MG/5ML IV SOLN
INTRAVENOUS | Status: AC
  Filled 2019-05-01: qty 5

## 2019-05-01 MED ORDER — IBUPROFEN 800 MG PO TABS
800.0000 mg | ORAL_TABLET | Freq: Three times a day (TID) | ORAL | 0 refills | Status: DC | PRN
Start: 2019-05-01 — End: 2021-05-06

## 2019-05-01 MED ORDER — KETOROLAC TROMETHAMINE 30 MG/ML IJ SOLN
INTRAMUSCULAR | Status: AC
  Filled 2019-05-01: qty 1

## 2019-05-01 MED ORDER — GABAPENTIN 300 MG PO CAPS
300.0000 mg | ORAL_CAPSULE | ORAL | Status: AC
  Administered 2019-05-01: 300 mg via ORAL

## 2019-05-01 MED ORDER — CEFAZOLIN SODIUM-DEXTROSE 1-4 GM/50ML-% IV SOLN
INTRAVENOUS | Status: AC
  Filled 2019-05-01: qty 50

## 2019-05-01 MED ORDER — CHLORHEXIDINE GLUCONATE CLOTH 2 % EX PADS: 6.0000 | MEDICATED_PAD | Freq: Once | CUTANEOUS | Status: DC

## 2019-05-01 MED ORDER — DEXAMETHASONE SODIUM PHOSPHATE 4 MG/ML IJ SOLN
INTRAMUSCULAR | Status: DC | PRN
  Administered 2019-05-01: 5 mg via INTRAVENOUS

## 2019-05-01 MED ORDER — GABAPENTIN 300 MG PO CAPS
ORAL_CAPSULE | ORAL | Status: AC
  Filled 2019-05-01: qty 1

## 2019-05-01 MED ORDER — PROPOFOL 10 MG/ML IV BOLUS
INTRAVENOUS | Status: AC
  Filled 2019-05-01: qty 20

## 2019-05-01 MED ORDER — PROPOFOL 500 MG/50ML IV EMUL
INTRAVENOUS | Status: DC | PRN
  Administered 2019-05-01: 50 ug/kg/min via INTRAVENOUS

## 2019-05-01 MED ORDER — ENSURE PRE-SURGERY PO LIQD: 296.0000 mL | Freq: Once | ORAL | Status: DC

## 2019-05-01 MED ORDER — ONDANSETRON HCL 4 MG/2ML IJ SOLN
INTRAMUSCULAR | Status: DC | PRN
  Administered 2019-05-01: 4 mg via INTRAVENOUS

## 2019-05-01 MED ORDER — PROPOFOL 500 MG/50ML IV EMUL
INTRAVENOUS | Status: AC
  Filled 2019-05-01: qty 50

## 2019-05-01 MED ORDER — ROCURONIUM BROMIDE 100 MG/10ML IV SOLN
INTRAVENOUS | Status: DC | PRN
  Administered 2019-05-01: 80 mg via INTRAVENOUS

## 2019-05-01 MED ORDER — CEFAZOLIN SODIUM-DEXTROSE 2-4 GM/100ML-% IV SOLN
2.0000 g | INTRAVENOUS | Status: AC
  Administered 2019-05-01: 3 g via INTRAVENOUS

## 2019-05-01 MED ORDER — KETOROLAC TROMETHAMINE 30 MG/ML IJ SOLN: 15.0000 mg | Freq: Once | INTRAMUSCULAR | Status: DC | PRN

## 2019-05-01 MED ORDER — MIDAZOLAM HCL 2 MG/2ML IJ SOLN
INTRAMUSCULAR | Status: AC
  Filled 2019-05-01: qty 2

## 2019-05-01 MED ORDER — ALBUTEROL SULFATE HFA 108 (90 BASE) MCG/ACT IN AERS
INHALATION_SPRAY | RESPIRATORY_TRACT | Status: AC
  Filled 2019-05-01: qty 6.7

## 2019-05-01 MED ORDER — FENTANYL CITRATE (PF) 100 MCG/2ML IJ SOLN
INTRAMUSCULAR | Status: DC | PRN
  Administered 2019-05-01 (×2): 50 ug via INTRAVENOUS
  Administered 2019-05-01: 100 ug via INTRAVENOUS
  Administered 2019-05-01: 50 ug via INTRAVENOUS

## 2019-05-01 MED ORDER — SUGAMMADEX SODIUM 500 MG/5ML IV SOLN
INTRAVENOUS | Status: DC | PRN
  Administered 2019-05-01: 250 mg via INTRAVENOUS

## 2019-05-01 SURGICAL SUPPLY — 51 items
APPLIER CLIP ROT 10 11.4 M/L (STAPLE)
BENZOIN TINCTURE PRP APPL 2/3 (GAUZE/BANDAGES/DRESSINGS) ×3 IMPLANT
BNDG ADH 1X3 FABRIC TAN LF (GAUZE/BANDAGES/DRESSINGS) ×3 IMPLANT
BNDG ADH 1X3 SHEER STRL LF (GAUZE/BANDAGES/DRESSINGS) ×12 IMPLANT
CABLE HIGH FREQUENCY MONO STRZ (ELECTRODE) ×3 IMPLANT
CATH CHOLANG 76X19 KUMAR (CATHETERS) IMPLANT
CHLORAPREP W/TINT 26 (MISCELLANEOUS) ×3 IMPLANT
CLIP APPLIE ROT 10 11.4 M/L (STAPLE) IMPLANT
CLIP VESOLOCK LG 6/CT PURPLE (CLIP) IMPLANT
CLIP VESOLOCK MED LG 6/CT (CLIP) ×6 IMPLANT
CLOSURE WOUND 1/2 X4 (GAUZE/BANDAGES/DRESSINGS) ×1
COVER MAYO STAND STRL (DRAPES) ×3 IMPLANT
COVER WAND RF STERILE (DRAPES) ×3 IMPLANT
DECANTER SPIKE VIAL GLASS SM (MISCELLANEOUS) ×3 IMPLANT
DERMABOND ADVANCED (GAUZE/BANDAGES/DRESSINGS) ×2
DERMABOND ADVANCED .7 DNX12 (GAUZE/BANDAGES/DRESSINGS) ×1 IMPLANT
DRAIN CHANNEL 19F RND (DRAIN) IMPLANT
DRAPE C-ARM 42X120 X-RAY (DRAPES) ×3 IMPLANT
ELECT REM PT RETURN 9FT ADLT (ELECTROSURGICAL) ×3
ELECTRODE REM PT RTRN 9FT ADLT (ELECTROSURGICAL) ×1 IMPLANT
EVACUATOR SILICONE 100CC (DRAIN) IMPLANT
GLOVE BIOGEL PI IND STRL 7.0 (GLOVE) ×1 IMPLANT
GLOVE BIOGEL PI INDICATOR 7.0 (GLOVE) ×2
GLOVE SURG SS PI 7.0 STRL IVOR (GLOVE) ×3 IMPLANT
GOWN STRL REUS W/TWL LRG LVL3 (GOWN DISPOSABLE) ×3 IMPLANT
GRASPER SUT TROCAR 14GX15 (MISCELLANEOUS) ×3 IMPLANT
HEMOSTAT SNOW SURGICEL 2X4 (HEMOSTASIS) IMPLANT
IRRIG SUCT STRYKERFLOW 2 WTIP (MISCELLANEOUS) ×3
IRRIGATION SUCT STRKRFLW 2 WTP (MISCELLANEOUS) ×1 IMPLANT
IV CATH 14GX2 1/4 (CATHETERS) IMPLANT
KIT TURNOVER CYSTO (KITS) ×3 IMPLANT
NS IRRIG 500ML POUR BTL (IV SOLUTION) ×3 IMPLANT
PENCIL SMOKE EVACUATOR (MISCELLANEOUS) IMPLANT
POUCH RETRIEVAL ECOSAC 10 (ENDOMECHANICALS) ×1 IMPLANT
POUCH RETRIEVAL ECOSAC 10MM (ENDOMECHANICALS) ×3
SCISSORS LAP 5X35 DISP (ENDOMECHANICALS) ×3 IMPLANT
SET BASIN DAY SURGERY F.S. (CUSTOM PROCEDURE TRAY) ×3 IMPLANT
SET TUBE SMOKE EVAC HIGH FLOW (TUBING) ×3 IMPLANT
SHEARS HARMONIC ACE PLUS 36CM (ENDOMECHANICALS) ×3 IMPLANT
STOPCOCK 4 WAY LG BORE MALE ST (IV SETS) IMPLANT
STRIP CLOSURE SKIN 1/2X4 (GAUZE/BANDAGES/DRESSINGS) ×2 IMPLANT
SUT ETHILON 2 0 PS N (SUTURE) IMPLANT
SUT MNCRL AB 4-0 PS2 18 (SUTURE) ×3 IMPLANT
SUT VICRYL 0 ENDOLOOP (SUTURE) IMPLANT
SUT VICRYL 0 UR6 27IN ABS (SUTURE) IMPLANT
TOWEL OR 17X26 10 PK STRL BLUE (TOWEL DISPOSABLE) ×3 IMPLANT
TRAY LAPAROSCOPIC (CUSTOM PROCEDURE TRAY) ×3 IMPLANT
TROCAR BLADELESS OPT 12M 100M (ENDOMECHANICALS) IMPLANT
TROCAR BLADELESS OPT 5 100 (ENDOMECHANICALS) ×12 IMPLANT
TROCAR XCEL NON-BLD 11X100MML (ENDOMECHANICALS) ×3 IMPLANT
WARMER LAPAROSCOPE (MISCELLANEOUS) ×3 IMPLANT

## 2019-05-01 NOTE — Transfer of Care (Signed)
Immediate Anesthesia Transfer of Care Note  Patient: [name] [name]  Procedure(s) Performed: LAPAROSCOPIC CHOLECYSTECTOMY (N/A Abdomen)  Patient Location: PACU  Anesthesia Type:General  Level of Consciousness: awake, alert  and oriented  Airway & Oxygen Therapy: Patient Spontanous Breathing and Patient connected to nasal cannula oxygen  Post-op Assessment: Report given to RN and Post -op Vital signs reviewed and stable  Post vital signs: Reviewed and stable  Last Vitals:  Vitals Value Taken Time  BP 174/91 05/01/19 1355  Temp    Pulse 99 05/01/19 1400  Resp 19 05/01/19 1400  SpO2 100 % 05/01/19 1400  Vitals shown include unvalidated device data.  Last Pain:  Vitals:   05/01/19 1037  TempSrc: Oral  PainSc: 0-No pain      Patients Stated Pain Goal: 5 ([id number])  Complications: No apparent anesthesia complications

## 2019-05-01 NOTE — Op Note (Signed)
PATIENT:  [name] [name]  69 y.o. male  PRE-OPERATIVE DIAGNOSIS:  CHRONIC CALCULOUS CHOLECYSTITIS  POST-OPERATIVE DIAGNOSIS:  CHRONIC CALCULOUS CHOLECYSTITIS  PROCEDURE:  Procedure(s): LAPAROSCOPIC CHOLECYSTECTOMY   SURGEON:  Surgeon(s): Graeden Bitner, Arta Bruce, MD  ASSISTANT: none  ANESTHESIA:   local and general  Indications for procedure: [name] is a 69 y.o. male with symptoms of Abdominal pain and Nausea and vomiting consistent with gallbladder disease, Confirmed by Ultrasound.  Description of procedure: The patient was brought into the operative suite, placed supine. Anesthesia was administered with endotracheal tube. Patient was strapped in place and foot board was secured. All pressure points were offloaded by foam padding. The patient was prepped and draped in the usual sterile fashion.  A small incision was made in the right upper quadrant. A 62mm trocar was inserted into the peritoneal cavity with optical entry. Pneumoperitoneum was applied with high flow low pressure. 1 24mm trocar was placed in the right lateral space. A 41mm trocar was placed in the left upper quadrant space. There was a large amount of adhesive disease to the incisional hernia mesh. Blunt dissection and harmonic scalpel were used to free the omentum away from the mesh in the right upper abdomen. Care was taken to avoid the colon or small intestine. A 5 mm trocar was placed in the mid right upper quadrant through the mesh. Marcaine was infused to the subxiphoid space and lateral upper right abdomen in the transversus abdominis plane. Next the patient was placed in reverse trendelenberg. The gallbladder was large with some adhesions to the medial wall these were taken down with harmonic scalpel.  The gallbladder was retracted cephalad and lateral. The peritoneum was reflected off the infundibulum working lateral to medial. The cystic duct and cystic artery were identified and further dissection revealed a  critical view. Dissection was difficult due to the liver having chronic fatty changes and being quite large. The cystic duct and cystic artery were doubly clipped and ligated. The right lateral trocar was up sized to a 12 mm trocar.  The gallbladder was removed off the liver bed with cautery. The Gallbladder was placed in a specimen bag. The gallbladder fossa was irrigated and hemostasis was applied with cautery. The gallbladder was removed via the 82mm trocar. The fascial defect was closed with interrupted 0 vicryl suture via laparoscopic trans-fascial suture passer. Pneumoperitoneum was removed, all trocar were removed. All incisions were closed with 4-0 monocryl subcuticular stitch. The patient woke from anesthesia and was brought to PACU in stable condition. All counts were correct  Findings: adhesive disease related to hernia repair. Large gallstone with small amount of chronic inflammation, fatty liver disease  Specimen: gallbladder  Blood loss: 30 ml  Local anesthesia: 30 ml marcaine  Complications: none  PLAN OF CARE: Discharge to home after PACU  PATIENT DISPOSITION:  PACU - hemodynamically stable.  Images:    Gurney Maxin, M.D. General, Bariatric, & Minimally Invasive Surgery [facility]

## 2019-05-01 NOTE — H&P (Signed)
LETCHER [name] is an 69 y.o. male.   Chief Complaint: abdominal pain HPI: 70 yo male with intermittent epigastric pain. Work up was consistent with gallbladder disease. He presents for surgery.  Past Medical History:  Diagnosis Date  . Cholelithiasis   . Chronic cough    04-26-2019 per pt occasionally productive in am  . COPD with asthma ([name] [name])   . Difficulty breathing    with activity  since covid 07/ 2020,  pt has underlining ILD/ COPD w/ asthma   (04-26-2019 pt has breo inhaler ordered by pulmonologist daily, however, pt not taking as prescribed , still had not refilled this medication   . Full dentures   . GERD (gastroesophageal reflux disease)   . Hiatal hernia   . History of 2019 novel coronavirus disease (COVID-19)    tested positive 07-08-2018 (results in epic) , no hospital admission; pt had pneumonia, fever, chills, body aches, cough;   (04-26-2019 pt still has residual difficulty breathing with activity  . Hyperlipidemia   . Hypertension    followed by pcp   (04-26-2019  per pt never had a stress test)  . ILD (interstitial lung disease) [name] [name])    pulmonologist--- dr Mamie Nick. Vaughan Browner  . OSA treated with BiPAP   . Peripheral neuropathy    feet  . Pulmonary nodule    followed by pulmonology  . Type 2 diabetes mellitus ([name] [name])    followed by pcp   (04-26-2019 per pt not checking blood sugar at this time, his machine is broken, in process of getting  another one)  . Wears glasses     Past Surgical History:  Procedure Laterality Date  . COLONOSCOPY  last one 02-07-2016 dr Loletha Carrow  . LAPAROSCOPIC ASSISTED VENTRAL HERNIA REPAIR  09-28-2006   @[facility]  and 12-04-2008 for recurrent @[facility]   . TONSILLECTOMY  child  . UMBILICAL HERNIA REPAIR  09-26-2004   @[facility]     Family History  Problem Relation Age of Onset  . Cancer Mother        ? type  . Heart disease Father   . Heart disease Brother   . Colon cancer Neg Hx    Social History:  reports that he quit smoking about 21 years ago. His  smoking use included cigarettes. He quit after 30.00 years of use. He has never used smokeless tobacco. He reports that he does not drink alcohol or use drugs.  Allergies:  Allergies  Allergen Reactions  . Mercury Swelling    Medications Prior to Admission  Medication Sig Dispense Refill  . albuterol (PROVENTIL HFA;VENTOLIN HFA) 108 (90 Base) MCG/ACT inhaler Inhale 1 puff into the lungs as needed.    Marland Kitchen aspirin EC 81 MG tablet Take 1 tablet by mouth daily.    Marland Kitchen atorvastatin (LIPITOR) 20 MG tablet Take 20 mg by mouth at bedtime.     . fluticasone (FLONASE) 50 MCG/ACT nasal spray Place 2 sprays into both nostrils daily. (Patient taking differently: Place 2 sprays into both nostrils daily as needed. ) 16 g 2  . meloxicam (MOBIC) 15 MG tablet Take 15 mg by mouth as needed.   0  . metFORMIN (GLUCOPHAGE) 500 MG tablet Take 1 tablet by mouth daily.     . montelukast (SINGULAIR) 10 MG tablet Take 10 mg by mouth at bedtime.     . Multiple Vitamin (MULTI-VITAMINS) TABS Take 1 tablet by mouth daily.    . Omega-3 Fatty Acids (SM FISH OIL) 1000 MG CAPS Take 1 capsule by mouth daily.     Marland Kitchen  pantoprazole (PROTONIX) 20 MG tablet Take 1 tablet (20 mg total) by mouth 2 (two) times daily. (Patient taking differently: Take 20 mg by mouth 2 (two) times daily. ) 60 tablet 2  . sitaGLIPtin (JANUVIA) 50 MG tablet Take 50 mg by mouth daily.     Marland Kitchen telmisartan-hydrochlorothiazide (MICARDIS HCT) 40-12.5 MG tablet Take 1 tablet by mouth daily.     . Blood Glucose Monitoring Suppl (FIFTY50 GLUCOSE METER 2.0) w/Device KIT Use as instructed    . fluticasone furoate-vilanterol (BREO ELLIPTA) 200-25 MCG/INH AEPB Inhale 1 puff into the lungs daily. (Patient not taking: Reported on 04/26/2019) 60 each 3  . Lancets (ACCU-CHEK MULTICLIX) lancets     . ONETOUCH VERIO test strip USE 1 STRIP TO CHECK GLUCOSE ONCE DAILY FOR BLOOD SUGAR  1    Results for orders placed or performed during the hospital encounter of 05/01/19 (from the  past 48 hour(s))  I-STAT, chem 8     Status: Abnormal   Collection Time: 05/01/19 10:38 AM  Result Value Ref Range   Sodium 142 135 - 145 mmol/L   Potassium 4.3 3.5 - 5.1 mmol/L   Chloride 106 98 - 111 mmol/L   BUN 19 8 - 23 mg/dL   Creatinine, Ser 1.00 0.61 - 1.24 mg/dL   Glucose, Bld 166 (H) 70 - 99 mg/dL    Comment: Glucose reference range applies only to samples taken after fasting for at least 8 hours.   Calcium, Ion 1.30 1.15 - 1.40 mmol/L   TCO2 23 22 - 32 mmol/L   Hemoglobin 13.6 13.0 - 17.0 g/dL   HCT 40.0 39.0 - 52.0 %   No results found.  Review of Systems  Constitutional: Negative for chills and fever.  HENT: Negative for hearing loss.   Respiratory: Negative for cough.   Cardiovascular: Negative for chest pain and palpitations.  Gastrointestinal: Positive for abdominal pain. Negative for nausea and vomiting.  Genitourinary: Negative for dysuria and urgency.  Musculoskeletal: Negative for myalgias and neck pain.  Skin: Negative for rash.  Neurological: Negative for dizziness and headaches.  Hematological: Does not bruise/bleed easily.  Psychiatric/Behavioral: Negative for suicidal ideas.    Blood pressure (!) 170/91, pulse 89, temperature (!) 97.3 F (36.3 C), temperature source Oral, resp. rate 18, height 5' 8"  (1.727 m), weight (!) 150.1 kg, SpO2 97 %. Physical Exam  Nursing note and vitals reviewed. Constitutional: He is oriented to person, place, and time. He appears well-developed and well-nourished.  HENT:  Head: Normocephalic and atraumatic.  Eyes: Conjunctivae and EOM are normal. No scleral icterus.  Cardiovascular: Normal rate and regular rhythm.  Respiratory: Effort normal and breath sounds normal. He has no wheezes. He has no rales. He exhibits no tenderness.  GI: Soft. He exhibits no distension. There is no abdominal tenderness. There is no rebound.  Musculoskeletal:        General: No edema. Normal range of motion.     Cervical back: Normal range  of motion and neck supple.  Neurological: He is alert and oriented to person, place, and time.  Skin: Skin is warm and dry.  Psychiatric: He has a normal mood and affect. His behavior is normal.     Assessment/Plan 69 yo  Male with chronic calculous cholecystitis -lap chole -ERAS protocol -planned outpatient procedure  Mickeal Skinner, MD 05/01/2019, 11:45 AM

## 2019-05-01 NOTE — Anesthesia Preprocedure Evaluation (Addendum)
Anesthesia Evaluation  Patient identified by MRN, date of birth, ID band Patient awake    Reviewed: Allergy & Precautions, NPO status , Patient's Chart, lab work & pertinent test results  Airway Mallampati: III  TM Distance: <3 FB Neck ROM: Full    Dental no notable dental hx. (+) Upper Dentures, Lower Dentures   Pulmonary asthma , sleep apnea and Continuous Positive Airway Pressure Ventilation , COPD, former smoker,    breath sounds clear to auscultation + decreased breath sounds      Cardiovascular hypertension, Normal cardiovascular exam Rhythm:Regular Rate:Normal     Neuro/Psych negative neurological ROS  negative psych ROS   GI/Hepatic Neg liver ROS, GERD  ,  Endo/Other  diabetesMorbid obesity  Renal/GU negative Renal ROS  negative genitourinary   Musculoskeletal negative musculoskeletal ROS (+)   Abdominal (+) + obese,   Peds negative pediatric ROS (+)  Hematology negative hematology ROS (+)   Anesthesia Other Findings   Reproductive/Obstetrics negative OB ROS                            Anesthesia Physical Anesthesia Plan  ASA: III  Anesthesia Plan: General   Post-op Pain Management:    Induction: Intravenous  PONV Risk Score and Plan: 2 and Ondansetron, Dexamethasone and Treatment may vary due to age or medical condition  Airway Management Planned: Oral ETT  Additional Equipment:   Intra-op Plan:   Post-operative Plan: Extubation in OR  Informed Consent: I have reviewed the patients History and Physical, chart, labs and discussed the procedure including the risks, benefits and alternatives for the proposed anesthesia with the patient or authorized representative who has indicated his/her understanding and acceptance.     Dental advisory given  Plan Discussed with: CRNA and Surgeon  Anesthesia Plan Comments:         Anesthesia Quick Evaluation

## 2019-05-01 NOTE — Anesthesia Postprocedure Evaluation (Signed)
Anesthesia Post Note  Patient: [name] [name]  Procedure(s) Performed: LAPAROSCOPIC CHOLECYSTECTOMY (N/A Abdomen)     Patient location during evaluation: PACU Anesthesia Type: General Level of consciousness: awake and alert Pain management: pain level controlled Vital Signs Assessment: post-procedure vital signs reviewed and stable Respiratory status: spontaneous breathing, nonlabored ventilation, respiratory function stable and patient connected to nasal cannula oxygen Cardiovascular status: blood pressure returned to baseline and stable Postop Assessment: no apparent nausea or vomiting Anesthetic complications: no    Last Vitals:  Vitals:   05/01/19 1500 05/01/19 1501  BP:    Pulse: 86 87  Resp: 15 17  Temp:    SpO2: 93% 95%    Last Pain:  Vitals:   05/01/19 1444  TempSrc:   PainSc: 5                  Guillaume Weninger S

## 2019-05-01 NOTE — Discharge Instructions (Signed)
  Do not take any Tylenol until after 5:00 pm today. Wear Bi pap at all times when sitting or lying down.    Post Anesthesia Home Care Instructions  Activity: Get plenty of rest for the remainder of the day. A responsible individual must stay with you for 24 hours following the procedure.  For the next 24 hours, DO NOT: -Drive a car -Paediatric nurse -Drink alcoholic beverages -Take any medication unless instructed by your physician -Make any legal decisions or sign important papers.  Meals: Start with liquid foods such as gelatin or soup. Progress to regular foods as tolerated. Avoid greasy, spicy, heavy foods. If nausea and/or vomiting occur, drink only clear liquids until the nausea and/or vomiting subsides. Call your physician if vomiting continues.  Special Instructions/Symptoms: Your throat may feel dry or sore from the anesthesia or the breathing tube placed in your throat during surgery. If this causes discomfort, gargle with warm salt water. The discomfort should disappear within 24 hours.

## 2019-05-01 NOTE — Anesthesia Procedure Notes (Signed)
Procedure Name: Intubation Date/Time: 05/01/2019 12:04 PM Performed by: Bufford Spikes, CRNA Pre-anesthesia Checklist: Patient identified, Emergency Drugs available, Suction available and Patient being monitored Patient Re-evaluated:Patient Re-evaluated prior to induction Oxygen Delivery Method: Circle system utilized Preoxygenation: Pre-oxygenation with 100% oxygen Induction Type: IV induction Ventilation: Mask ventilation without difficulty Laryngoscope Size: Miller and 2 Grade View: Grade II Tube type: Oral Tube size: 7.0 mm Number of attempts: 1 Airway Equipment and Method: Stylet and Oral airway Placement Confirmation: ETT inserted through vocal cords under direct vision,  positive ETCO2 and breath sounds checked- equal and bilateral Secured at: 22 cm Tube secured with: Tape Dental Injury: Teeth and Oropharynx as per pre-operative assessment

## 2019-05-03 LAB — SURGICAL PATHOLOGY

## 2019-11-02 ENCOUNTER — Encounter: Payer: Self-pay | Admitting: Podiatry

## 2019-11-02 ENCOUNTER — Ambulatory Visit: Payer: Medicare HMO | Admitting: Podiatry

## 2019-11-02 ENCOUNTER — Other Ambulatory Visit: Payer: Self-pay

## 2019-11-02 DIAGNOSIS — E1142 Type 2 diabetes mellitus with diabetic polyneuropathy: Secondary | ICD-10-CM | POA: Diagnosis not present

## 2019-11-02 DIAGNOSIS — M2041 Other hammer toe(s) (acquired), right foot: Secondary | ICD-10-CM | POA: Diagnosis not present

## 2019-11-02 DIAGNOSIS — B351 Tinea unguium: Secondary | ICD-10-CM | POA: Diagnosis not present

## 2019-11-02 DIAGNOSIS — M79674 Pain in right toe(s): Secondary | ICD-10-CM

## 2019-11-02 DIAGNOSIS — M2011 Hallux valgus (acquired), right foot: Secondary | ICD-10-CM | POA: Diagnosis not present

## 2019-11-02 DIAGNOSIS — E119 Type 2 diabetes mellitus without complications: Secondary | ICD-10-CM

## 2019-11-02 DIAGNOSIS — M79675 Pain in left toe(s): Secondary | ICD-10-CM | POA: Diagnosis not present

## 2019-11-02 DIAGNOSIS — M2042 Other hammer toe(s) (acquired), left foot: Secondary | ICD-10-CM

## 2019-11-02 DIAGNOSIS — M2012 Hallux valgus (acquired), left foot: Secondary | ICD-10-CM

## 2019-11-05 NOTE — Progress Notes (Addendum)
ANNUAL DIABETIC FOOT EXAM  Subjective: [name] [name] presents today for for annual diabetic foot examination and painful thick toenails that are difficult to trim. Pain interferes with ambulation. Aggravating factors include wearing enclosed shoe gear. Pain is relieved with periodic professional debridement..  Patient relates diagnosis of diabetes.  Patient denies h/o foot wounds.  Patient relates symptoms of foot numbness.  Patient relates symptoms of foot tingling.  Patient relates symptoms of burning in feet.  Cyndi Bender, PA-C is patient's PCP. Last visit was last week.  Past Medical History:  Diagnosis Date  . Cholelithiasis   . Chronic cough    04-26-2019 per pt occasionally productive in am  . COPD with asthma ([place])   . Difficulty breathing    with activity  since covid 07/ 2020,  pt has underlining ILD/ COPD w/ asthma   (04-26-2019 pt has breo inhaler ordered by pulmonologist daily, however, pt not taking as prescribed , still had not refilled this medication   . Full dentures   . GERD (gastroesophageal reflux disease)   . Hiatal hernia   . History of 2019 novel coronavirus disease (COVID-19)    tested positive 07-08-2018 (results in epic) , no hospital admission; pt had pneumonia, fever, chills, body aches, cough;   (04-26-2019 pt still has residual difficulty breathing with activity  . Hyperlipidemia   . Hypertension    followed by pcp   (04-26-2019  per pt never had a stress test)  . ILD (interstitial lung disease) [facility])    pulmonologist--- dr Mamie Nick. Vaughan Browner  . OSA treated with BiPAP   . Peripheral neuropathy    feet  . Pulmonary nodule    followed by pulmonology  . Type 2 diabetes mellitus ([place])    followed by pcp   (04-26-2019 per pt not checking blood sugar at this time, his machine is broken, in process of getting  another one)  . Wears glasses     Patient Active Problem List   Diagnosis Date Noted  . Allergic rhinitis 01/27/2019  . Osteoarthritis of  left knee 01/27/2019  . Type 2 diabetes mellitus ([place]) 10/27/2013  . Morbid obesity ([place]) 02/24/2013  . History of tobacco use 02/24/2013  . Essential (primary) hypertension 02/16/2013  . HLD (hyperlipidemia) 12/16/2012  . Contracture of palmar fascia 11/29/2012  . ED (erectile dysfunction) of organic origin 05/20/2009    Past Surgical History:  Procedure Laterality Date  . CHOLECYSTECTOMY N/A 05/01/2019   Procedure: LAPAROSCOPIC CHOLECYSTECTOMY;  Surgeon: Kinsinger, Arta Bruce, MD;  Location: [facility];  Service: General;  Laterality: N/A;  . COLONOSCOPY  last one 02-07-2016 dr Loletha Carrow  . LAPAROSCOPIC ASSISTED VENTRAL HERNIA REPAIR  09-28-2006   @[facility]  and 12-04-2008 for recurrent @[facility]   . TONSILLECTOMY  child  . UMBILICAL HERNIA REPAIR  09-26-2004   @[facility]     Current Outpatient Medications on File Prior to Visit  Medication Sig Dispense Refill  . albuterol (PROVENTIL HFA;VENTOLIN HFA) 108 (90 Base) MCG/ACT inhaler Inhale 1 puff into the lungs as needed.    Marland Kitchen aspirin EC 81 MG tablet Take 1 tablet by mouth daily.    Marland Kitchen atorvastatin (LIPITOR) 20 MG tablet Take 20 mg by mouth at bedtime.     . Blood Glucose Monitoring Suppl (FIFTY50 GLUCOSE METER 2.0) w/Device KIT Use as instructed    . Calcium Carb-Cholecalciferol 500-400 MG-UNIT CHEW Chew 1 tablet by mouth daily.    . fluticasone (FLONASE) 50 MCG/ACT nasal spray Place 2 sprays into both nostrils daily. (Patient  taking differently: Place 2 sprays into both nostrils daily as needed. ) 16 g 2  . fluticasone furoate-vilanterol (BREO ELLIPTA) 200-25 MCG/INH AEPB Inhale 1 puff into the lungs daily. (Patient not taking: Reported on 04/26/2019) 60 each 3  . glipiZIDE (GLUCOTROL XL) 10 MG 24 hr tablet Take 10 mg by mouth daily.    Marland Kitchen ibuprofen (ADVIL) 800 MG tablet Take 1 tablet (800 mg total) by mouth every 8 (eight) hours as needed. 30 tablet 0  . Lancets (ACCU-CHEK MULTICLIX) lancets     . meloxicam (MOBIC) 15 MG tablet Take 15 mg by  mouth as needed.   0  . metFORMIN (GLUCOPHAGE) 500 MG tablet Take 1 tablet by mouth daily.     . montelukast (SINGULAIR) 10 MG tablet Take 10 mg by mouth at bedtime.     . Multiple Vitamin (MULTI-VITAMINS) TABS Take 1 tablet by mouth daily.    . Omega-3 Fatty Acids (SM FISH OIL) 1000 MG CAPS Take 1 capsule by mouth daily.     [name] test strip USE 1 STRIP TO CHECK GLUCOSE ONCE DAILY FOR BLOOD SUGAR  1  . oxyCODONE (OXY IR/ROXICODONE) 5 MG immediate release tablet Take 1 tablet (5 mg total) by mouth every 6 (six) hours as needed for severe pain. 15 tablet 0  . sitaGLIPtin (JANUVIA) 50 MG tablet Take 50 mg by mouth daily.     Marland Kitchen telmisartan-hydrochlorothiazide (MICARDIS HCT) 40-12.5 MG tablet Take 1 tablet by mouth daily.      No current facility-administered medications on file prior to visit.     Allergies  Allergen Reactions  . Mercury Swelling    Social History   Occupational History  . Occupation: retired  Tobacco Use  . Smoking status: Former Smoker    Years: 30.00    Types: Cigarettes    Quit date: 01/05/1998    Years since quitting: 21.8  . Smokeless tobacco: Never Used  Vaping Use  . Vaping Use: Never used  Substance and Sexual Activity  . Alcohol use: No  . Drug use: Never  . Sexual activity: Not on file    Comment: vasectomy    Family History  Problem Relation Age of Onset  . Cancer Mother        ? type  . Heart disease Father   . Heart disease Brother   . Colon cancer Neg Hx     Immunization History  Administered Date(s) Administered  . Influenza Split 10/21/2009, 10/10/2010, 01/14/2012  . Influenza, High Dose Seasonal PF 10/05/2017, 10/14/2018  . Influenza,inj,Quad PF,6+ Mos 01/08/2015, 01/08/2015, 09/06/2015, 09/06/2015  . Influenza,inj,quad, With Preservative 10/27/2012, 10/23/2013  . Influenza-Unspecified 10/21/2009, 10/10/2010, 01/14/2012, 10/27/2012, 10/23/2013, 01/08/2015, 09/06/2015, 10/08/2016  . Pneumococcal Conjugate-13 01/27/2016,  01/27/2016  . Pneumococcal Polysaccharide-23 01/02/2014, 03/08/2017  . Tdap 06/13/2010, 06/13/2010     Objective: There were no vitals filed for this visit.  [name] [name] is a pleasant 69 y.o. male in NAD. AAO X 3.  Vascular Examination: Capillary fill time to digits <3 seconds b/l lower extremities. Palpable pedal pulses b/l LE. Pedal hair sparse. Lower extremity skin temperature gradient within normal limits. No pain with calf compression b/l. Varicosities present b/l.  Dermatological Examination: Pedal skin with normal turgor, texture and tone bilaterally. No open wounds bilaterally. No interdigital macerations bilaterally. Toenails 1-5 b/l elongated, discolored, dystrophic, thickened, crumbly with subungual debris and tenderness to dorsal palpation. Incurvated nailplate b/l hallux, right 2nd and right 3rd digits border(s).  Nail border hypertrophy absent. There  is tenderness to palpation.  No signs of infection.  Musculoskeletal Examination: Normal muscle strength 5/5 to all lower extremity muscle groups bilaterally. Hallux valgus with bunion deformity noted b/l lower extremities. Hammertoes noted to the 2-5 bilaterally.  Footwear Assessment: Does the patient wear appropriate shoes? Yes. Does the patient need inserts/orthotics? No.  Neurological Examination: Pt has subjective symptoms of neuropathy. Protective sensation intact 5/5 intact bilaterally with 10g monofilament b/l. Vibratory sensation intact b/l. Clonus negative b/l.  Assessment: 1. Pain due to onychomycosis of toenails of both feet   2. Hallux valgus, acquired, bilateral   3. Acquired hammertoes of both feet   4. Diabetic peripheral neuropathy associated with type 2 diabetes mellitus ([place])   5. Encounter for diabetic foot exam ([place])     ADA Risk Categorization: Low Risk :  Patient has all of the following: Intact protective sensation No prior foot ulcer  No severe deformity Pedal pulses  present  Plan: -Examined patient. -Diabetic foot examination performed on today's visit. -Continue diabetic foot care principles. -Toenails 1-5 b/l were debrided in length and girth with sterile nail nippers and dremel without iatrogenic bleeding.  -Offending nail border debrided and curretaged L hallux, R hallux, R 2nd toe and R 3rd toe utilizing sterile nail nipper and currette. Border cleansed with alcohol and triple antibiotic applied. No further treatment required by patient/caregiver. -Patient to report any pedal injuries to medical professional immediately. -Patient to continue soft, supportive shoe gear daily. -Patient/POA to call should there be question/concern in the interim.  Return in about 3 months (around 02/02/2020).  Marzetta Board, DPM

## 2020-02-01 ENCOUNTER — Ambulatory Visit: Payer: Medicare HMO | Admitting: Podiatry

## 2020-03-13 ENCOUNTER — Other Ambulatory Visit: Payer: Self-pay

## 2020-03-13 ENCOUNTER — Ambulatory Visit (INDEPENDENT_AMBULATORY_CARE_PROVIDER_SITE_OTHER): Payer: Medicare Other | Admitting: Sports Medicine

## 2020-03-13 ENCOUNTER — Encounter: Payer: Self-pay | Admitting: Sports Medicine

## 2020-03-13 DIAGNOSIS — B351 Tinea unguium: Secondary | ICD-10-CM | POA: Diagnosis not present

## 2020-03-13 DIAGNOSIS — M2011 Hallux valgus (acquired), right foot: Secondary | ICD-10-CM

## 2020-03-13 DIAGNOSIS — E1142 Type 2 diabetes mellitus with diabetic polyneuropathy: Secondary | ICD-10-CM

## 2020-03-13 DIAGNOSIS — M79675 Pain in left toe(s): Secondary | ICD-10-CM

## 2020-03-13 DIAGNOSIS — M79674 Pain in right toe(s): Secondary | ICD-10-CM

## 2020-03-13 DIAGNOSIS — M2042 Other hammer toe(s) (acquired), left foot: Secondary | ICD-10-CM

## 2020-03-13 DIAGNOSIS — M2012 Hallux valgus (acquired), left foot: Secondary | ICD-10-CM

## 2020-03-13 DIAGNOSIS — M2041 Other hammer toe(s) (acquired), right foot: Secondary | ICD-10-CM

## 2020-03-13 NOTE — Progress Notes (Signed)
Subjective: [name] [name] is a 70 y.o. male patient with history of diabetes who presents to office today complaining of long,mildly painful nails  while ambulating in shoes; unable to trim. Patient states that the glucose reading this morning was 118 mg/dl. Patient denies any new changes in medication or new problems. Patient reports that he changed pharmacy. No other issue noted.   Patient Active Problem List   Diagnosis Date Noted  . Allergic rhinitis 01/27/2019  . Osteoarthritis of left knee 01/27/2019  . Type 2 diabetes mellitus ([place]) 10/27/2013  . Morbid obesity ([place]) 02/24/2013  . History of tobacco use 02/24/2013  . Essential (primary) hypertension 02/16/2013  . HLD (hyperlipidemia) 12/16/2012  . Contracture of palmar fascia 11/29/2012  . 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 albuterol (PROVENTIL HFA;VENTOLIN HFA) 108 (90 Base) MCG/ACT inhaler Inhale 1 puff into the lungs as needed.    Marland Kitchen aspirin EC 81 MG tablet Take 1 tablet by mouth daily.    Marland Kitchen atorvastatin (LIPITOR) 20 MG tablet Take 20 mg by mouth at bedtime.     . Blood Glucose Monitoring Suppl (FIFTY50 GLUCOSE METER 2.0) w/Device KIT Use as instructed    . Calcium Carb-Cholecalciferol 500-400 MG-UNIT CHEW Chew 1 tablet by mouth daily.    . fluticasone (FLONASE) 50 MCG/ACT nasal spray Place 2 sprays into both nostrils daily. (Patient taking differently: Place 2 sprays into both nostrils daily as needed. ) 16 g 2  . fluticasone furoate-vilanterol (BREO ELLIPTA) 200-25 MCG/INH AEPB Inhale 1 puff into the lungs daily. (Patient not taking: Reported on 04/26/2019) 60 each 3  . glipiZIDE (GLUCOTROL XL) 10 MG 24 hr tablet Take 10 mg by mouth daily.    Marland Kitchen ibuprofen (ADVIL) 800 MG tablet Take 1 tablet (800 mg total) by mouth every 8 (eight) hours as needed. 30 tablet 0  . Lancets (ACCU-CHEK MULTICLIX) lancets     . meloxicam  (MOBIC) 15 MG tablet Take 15 mg by mouth as needed.   0  . metFORMIN (GLUCOPHAGE) 500 MG tablet Take 1 tablet by mouth daily.     . montelukast (SINGULAIR) 10 MG tablet Take 10 mg by mouth at bedtime.     . Multiple Vitamin (MULTI-VITAMINS) TABS Take 1 tablet by mouth daily.    . Omega-3 Fatty Acids (SM FISH OIL) 1000 MG CAPS Take 1 capsule by mouth daily.     [name] test strip USE 1 STRIP TO CHECK GLUCOSE ONCE DAILY FOR BLOOD SUGAR  1  . oxyCODONE (OXY IR/ROXICODONE) 5 MG immediate release tablet Take 1 tablet (5 mg total) by mouth every 6 (six) hours as needed for severe pain. 15 tablet 0  . sitaGLIPtin (JANUVIA) 50 MG tablet Take 50 mg by mouth daily.     Marland Kitchen telmisartan-hydrochlorothiazide (MICARDIS HCT) 40-12.5 MG tablet Take 1 tablet by mouth daily.      No current facility-administered medications on file prior to visit.   Allergies  Allergen Reactions  . Mercury Swelling    No results found for this or any previous visit (from the past 2160 hour(s)).  Objective: General: Patient is awake, alert, and oriented x 3 and in no acute distress.  Integument: Skin is warm, dry and supple bilateral. Nails are tender, long, thickened and  dystrophic with subungual debris, consistent with onychomycosis, 1-5 bilateral. No signs of infection. No open lesions or preulcerative lesions present bilateral. Remaining  integument unremarkable.  Vasculature:  Dorsalis Pedis pulse 1/4 bilateral. Posterior Tibial pulse  1/4 bilateral.  Capillary fill time <3 sec 1-5 bilateral. Positive hair growth to the level of the digits. Temperature gradient within normal limits. No varicosities present bilateral. No edema present bilateral.   Neurology: The patient has intact sensation measured with a 5.07/10g Semmes Weinstein Monofilament at all pedal sites bilateral . Vibratory sensation intact bilateral with tuning fork from previous. No Babinski sign present bilateral.   Musculoskeletal: Asymptomatic  pes planus and Syndactyl of toes 2-3 pedal deformities noted bilateral. Muscular strength 5/5 in all lower extremity muscular groups bilateral without pain on range of motion . No tenderness with calf compression bilateral.  Assessment and Plan: Problem List Items Addressed This Visit   None   Visit Diagnoses    Pain due to onychomycosis of toenails of both feet    -  Primary   Diabetic peripheral neuropathy associated with type 2 diabetes mellitus (HCC)       Hallux valgus, acquired, bilateral       Acquired hammertoes of both feet          -Examined patient. -Discussed and educated patient on diabetic foot care, especially with  regards to the vascular, neurological and musculoskeletal systems.  -Stressed the importance of good glycemic control and the detriment of not  controlling glucose levels in relation to the foot. -Mechanically debrided all nails 1-5 bilateral using sterile nail nipper and filed with dremel without incident  -Answered all patient questions -Patient to return  in 3 months for at risk foot care -Patient advised to call the office if any problems or questions arise in the meantime.  Landis Martins, DPM

## 2020-05-08 DIAGNOSIS — M1712 Unilateral primary osteoarthritis, left knee: Secondary | ICD-10-CM | POA: Diagnosis not present

## 2020-06-14 ENCOUNTER — Encounter: Payer: Self-pay | Admitting: Sports Medicine

## 2020-06-14 ENCOUNTER — Other Ambulatory Visit: Payer: Self-pay

## 2020-06-14 ENCOUNTER — Ambulatory Visit (INDEPENDENT_AMBULATORY_CARE_PROVIDER_SITE_OTHER): Payer: Medicare Other | Admitting: Sports Medicine

## 2020-06-14 DIAGNOSIS — M79674 Pain in right toe(s): Secondary | ICD-10-CM | POA: Diagnosis not present

## 2020-06-14 DIAGNOSIS — B351 Tinea unguium: Secondary | ICD-10-CM | POA: Diagnosis not present

## 2020-06-14 DIAGNOSIS — M79675 Pain in left toe(s): Secondary | ICD-10-CM | POA: Diagnosis not present

## 2020-06-14 DIAGNOSIS — E1142 Type 2 diabetes mellitus with diabetic polyneuropathy: Secondary | ICD-10-CM | POA: Diagnosis not present

## 2020-06-14 NOTE — Progress Notes (Signed)
Subjective: [name] [name] is a 70 y.o. male patient with history of diabetes who presents to office today complaining of long,mildly painful nails  while ambulating in shoes; unable to trim. Patient states that the glucose reading this morning was not recorded, ran out of strips, Last PCP visit was 1-2 months ago. Patient denies any new changes in medication or new problems. No other issues noted.   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     aspirin EC 81 MG tablet Take 1 tablet by mouth daily.     atorvastatin (LIPITOR) 20 MG tablet Take 20 mg by mouth at bedtime.      Blood Glucose Monitoring Suppl (FIFTY50 GLUCOSE METER 2.0) w/Device KIT Use as instructed     Calcium Carb-Cholecalciferol 500-400 MG-UNIT CHEW Chew 1 tablet by mouth daily.     diclofenac Sodium (VOLTAREN) 1 % GEL SMARTSIG:Gram(s) Topical 1-4 Times Daily     fluticasone (FLONASE) 50 MCG/ACT nasal spray Place 2 sprays into both nostrils daily. (Patient taking differently: Place 2 sprays into both nostrils daily as needed. ) 16 g 2   fluticasone furoate-vilanterol (BREO ELLIPTA) 200-25 MCG/INH AEPB Inhale 1 puff into the lungs daily. (Patient not taking: Reported on 04/26/2019) 60 each 3   glipiZIDE (GLUCOTROL XL) 10 MG 24 hr tablet Take 10 mg by mouth daily.     ibuprofen (ADVIL) 800 MG tablet Take 1 tablet (800 mg total) by mouth every 8 (eight) hours as  needed. 30 tablet 0   Lancets (ACCU-CHEK MULTICLIX) lancets      meloxicam (MOBIC) 15 MG tablet Take 15 mg by mouth as needed.   0   metFORMIN (GLUCOPHAGE) 500 MG tablet Take 1 tablet by mouth daily.      metFORMIN (GLUCOPHAGE-XR) 500 MG 24 hr tablet Take 1,000 mg by mouth 2 (two) times daily.     montelukast (SINGULAIR) 10 MG tablet Take 10 mg by mouth at bedtime.      Multiple Vitamin (MULTI-VITAMINS) TABS Take 1 tablet by mouth daily.     Omega-3 Fatty Acids (SM FISH OIL) 1000 MG CAPS Take 1 capsule by mouth daily.      ONETOUCH VERIO test strip USE 1 STRIP TO CHECK GLUCOSE ONCE DAILY FOR BLOOD SUGAR  1   oxyCODONE (OXY IR/ROXICODONE) 5 MG immediate release tablet Take 1 tablet (5 mg total) by mouth every 6 (six) hours as needed for severe pain. 15 tablet 0   pantoprazole (PROTONIX) 20 MG tablet Take 20 mg by mouth daily.     sitaGLIPtin (JANUVIA) 50 MG tablet Take 50 mg by mouth daily.      telmisartan-hydrochlorothiazide (MICARDIS HCT) 40-12.5 MG tablet Take 1 tablet by mouth daily.      No current facility-administered medications on file prior to visit.   Allergies  Allergen Reactions   Mercury Swelling    No results found for this or any previous visit (from the past 2160  hour(s)).  Objective: General: Patient is awake, alert, and oriented x 3 and in no acute distress.  Integument: Skin is warm, dry and supple bilateral. Nails are tender, long, thickened and  dystrophic with subungual debris, consistent with onychomycosis, 1-5 bilateral. No signs of infection. No open lesions or preulcerative lesions present bilateral. Remaining integument unremarkable.  Vasculature:  Dorsalis Pedis pulse 1/4 bilateral. Posterior Tibial pulse  1/4 bilateral.  Capillary fill time <3 sec 1-5 bilateral. Positive hair growth to the level of the digits. Temperature gradient within normal limits. No varicosities present bilateral. No edema present bilateral.   Neurology: The patient has intact via  light touch bilateral.   Musculoskeletal: Asymptomatic pes planus and Syndactyl of toes 2-3 pedal deformities noted bilateral. Muscular strength 5/5 in all lower extremity muscular groups bilateral without pain on range of motion . No tenderness with calf compression bilateral.  Assessment and Plan: Problem List Items Addressed This Visit   None Visit Diagnoses     Pain due to onychomycosis of toenails of both feet    -  Primary   Diabetic peripheral neuropathy associated with type 2 diabetes mellitus (HCC)       Relevant Medications   metFORMIN (GLUCOPHAGE-XR) 500 MG 24 hr tablet       -Examined patient. -Discussed and educated patient on diabetic foot care, especially with  regards to the vascular, neurological and musculoskeletal systems.  -Mechanically debrided all nails 1-5 bilateral using sterile nail nipper and filed with dremel without incident  -Answered all patient questions -Patient to return  in 3 months for at risk foot care -Patient advised to call the office if any problems or questions arise in the meantime.  Landis Martins, DPM

## 2020-07-30 DIAGNOSIS — M1712 Unilateral primary osteoarthritis, left knee: Secondary | ICD-10-CM | POA: Diagnosis not present

## 2020-07-30 DIAGNOSIS — E785 Hyperlipidemia, unspecified: Secondary | ICD-10-CM | POA: Diagnosis not present

## 2020-07-30 DIAGNOSIS — Z125 Encounter for screening for malignant neoplasm of prostate: Secondary | ICD-10-CM | POA: Diagnosis not present

## 2020-07-30 DIAGNOSIS — I10 Essential (primary) hypertension: Secondary | ICD-10-CM | POA: Diagnosis not present

## 2020-07-30 DIAGNOSIS — E118 Type 2 diabetes mellitus with unspecified complications: Secondary | ICD-10-CM | POA: Diagnosis not present

## 2020-08-22 DIAGNOSIS — M1712 Unilateral primary osteoarthritis, left knee: Secondary | ICD-10-CM | POA: Diagnosis not present

## 2020-09-17 ENCOUNTER — Ambulatory Visit: Payer: Medicare Other | Admitting: Sports Medicine

## 2020-09-19 DIAGNOSIS — M1712 Unilateral primary osteoarthritis, left knee: Secondary | ICD-10-CM | POA: Diagnosis not present

## 2020-09-19 DIAGNOSIS — E119 Type 2 diabetes mellitus without complications: Secondary | ICD-10-CM | POA: Diagnosis not present

## 2020-09-19 DIAGNOSIS — E569 Vitamin deficiency, unspecified: Secondary | ICD-10-CM | POA: Diagnosis not present

## 2020-09-20 ENCOUNTER — Other Ambulatory Visit (HOSPITAL_COMMUNITY): Payer: Self-pay | Admitting: General Surgery

## 2020-09-20 DIAGNOSIS — E611 Iron deficiency: Secondary | ICD-10-CM

## 2020-09-20 DIAGNOSIS — E569 Vitamin deficiency, unspecified: Secondary | ICD-10-CM

## 2020-09-20 DIAGNOSIS — E119 Type 2 diabetes mellitus without complications: Secondary | ICD-10-CM

## 2020-09-23 DIAGNOSIS — E611 Iron deficiency: Secondary | ICD-10-CM | POA: Diagnosis not present

## 2020-09-23 DIAGNOSIS — E569 Vitamin deficiency, unspecified: Secondary | ICD-10-CM | POA: Diagnosis not present

## 2020-09-23 DIAGNOSIS — E785 Hyperlipidemia, unspecified: Secondary | ICD-10-CM | POA: Diagnosis not present

## 2020-09-23 DIAGNOSIS — E118 Type 2 diabetes mellitus with unspecified complications: Secondary | ICD-10-CM | POA: Diagnosis not present

## 2020-09-27 ENCOUNTER — Ambulatory Visit (HOSPITAL_COMMUNITY)
Admission: RE | Admit: 2020-09-27 | Discharge: 2020-09-27 | Disposition: A | Discharge status: Home / Self Care | Payer: Medicare Other | Source: Ambulatory Visit | Attending: General Surgery | Admitting: General Surgery

## 2020-09-27 ENCOUNTER — Other Ambulatory Visit: Payer: Self-pay

## 2020-09-27 DIAGNOSIS — E569 Vitamin deficiency, unspecified: Secondary | ICD-10-CM | POA: Insufficient documentation

## 2020-09-27 DIAGNOSIS — Z01818 Encounter for other preprocedural examination: Secondary | ICD-10-CM | POA: Diagnosis not present

## 2020-09-27 DIAGNOSIS — E611 Iron deficiency: Secondary | ICD-10-CM | POA: Diagnosis not present

## 2020-09-27 DIAGNOSIS — E119 Type 2 diabetes mellitus without complications: Secondary | ICD-10-CM | POA: Diagnosis not present

## 2020-09-30 ENCOUNTER — Other Ambulatory Visit (HOSPITAL_COMMUNITY): Payer: Self-pay | Admitting: General Surgery

## 2020-09-30 DIAGNOSIS — E119 Type 2 diabetes mellitus without complications: Secondary | ICD-10-CM

## 2020-09-30 DIAGNOSIS — E611 Iron deficiency: Secondary | ICD-10-CM

## 2020-09-30 DIAGNOSIS — E569 Vitamin deficiency, unspecified: Secondary | ICD-10-CM

## 2020-10-29 ENCOUNTER — Encounter: Payer: Medicare Other | Attending: General Surgery | Admitting: Skilled Nursing Facility1

## 2020-10-29 ENCOUNTER — Other Ambulatory Visit: Payer: Self-pay

## 2020-10-29 ENCOUNTER — Encounter: Payer: Self-pay | Admitting: Skilled Nursing Facility1

## 2020-10-29 DIAGNOSIS — Z794 Long term (current) use of insulin: Secondary | ICD-10-CM | POA: Diagnosis not present

## 2020-10-29 DIAGNOSIS — E119 Type 2 diabetes mellitus without complications: Secondary | ICD-10-CM | POA: Diagnosis not present

## 2020-10-29 NOTE — Progress Notes (Signed)
Nutrition Assessment for Bariatric Surgery Medical Nutrition Therapy Appt Start Time: 9:40    End Time: 10:40  Patient was seen on 10/29/2020 for Pre-Operative Nutrition Assessment. Letter of approval faxed to [facility] bariatric surgery program coordinator on 10/29/2020.   Referral stated Supervised Weight Loss (SWL) visits needed: 6  Planned surgery: Sleeve gastrectomy  Pt expectation of surgery: to qualify for knee replacement  Pt expectation of dietitian: to help me out along the way    NUTRITION ASSESSMENT   Anthropometrics  Start weight at [facility]: 324.7 lbs (date: 10/29/2020)  Height: 68 in BMI: 49.37 kg/m2     Clinical  Medical hx: Asthma, diabetes, GERD, COPD, hyperlipidemia, HTN Medications: see lost: metformin, glipizide,omega 3, januvia, vitamin D Labs: none in the EMR Notable signs/symptoms: winded easier since covid in 2020 Any previous deficiencies? Iron, vitamin D  Micronutrient Nutrition Focused Physical Exam: Hair: No issues observed Eyes: No issues observed Mouth: No issues observed Neck: No issues observed Nails: No issues observed Skin: No issues observed  Lifestyle & Dietary Hx  Pt states he needs a knee replacement but they will not do it until he hits 250 pounds.   Pt states he has had diabetes for a few years now stating he checks his blood sugars fasting: 115-120. Pt states he have dentures that di fit well.   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 Pt sates he lives 5 doors down from his daughter.   24-Hr Dietary Recall First Meal: bacon and eggs sometimes with one piece of toast Snack:  Second Meal: sandwich + chips Snack: atkins bar Third Meal: pork chops + turnip greens + potatoes Snack:  Beverages: 2 cups coffee + sugar free creamer, water, diet soda, water + flavoring    Estimated Energy Needs Calories: 1800   NUTRITION DIAGNOSIS  Overweight/obesity ([place]-3.3) related to past poor dietary habits and  physical inactivity as evidenced by patient w/ planned sleeve gastrectomy surgery following dietary guidelines for continued weight loss.    NUTRITION INTERVENTION  Nutrition counseling (C-1) and education (E-2) to facilitate bariatric surgery goals.  Educated pt on micronutrient deficiencies post surgery and strategies to mitigate that risk   Pre-Op Goals Reviewed with the Patient Track food and beverage intake (pen and paper, MyFitness Pal, Baritastic app, etc.) Make healthy food choices while monitoring portion sizes: add more veggies Consume 3 meals per day or try to eat every 3-5 hours Avoid concentrated sugars and fried foods Keep sugar & fat in the single digits per serving on food labels Practice CHEWING your food (aim for applesauce consistency) Practice not drinking 15 minutes before, during, and 30 minutes after each meal and snack Avoid all carbonated beverages (ex: soda, sparkling beverages)  Limit caffeinated beverages (ex: coffee, tea, energy drinks) Avoid all sugar-sweetened beverages (ex: regular soda, sports drinks)  Avoid alcohol  Aim for 64-100 ounces of FLUID daily (with at least half of fluid intake being plain water)  Aim for at least 60-80 grams of PROTEIN daily Look for a liquid protein source that contains ?15 g protein and ?5 g carbohydrate (ex: shakes, drinks, shots) Make a list of non-food related activities Physical activity is an important part of a healthy lifestyle so keep it moving! The goal is to reach 150 minutes of exercise per week, including cardiovascular and weight baring activity.  *Goals that are bolded indicate the pt would like to start working towards these  Handouts Provided Include  Bariatric Surgery handouts (Nutrition Visits,  Pre-Op Goals, Protein Shakes, Vitamins & Minerals)  Learning Style & Readiness for Change Teaching method utilized: Visual & Auditory  Demonstrated degree of understanding via: Teach Back  Readiness Level:  contemplative  Barriers to learning/adherence to lifestyle change: unidentified   RD's Notes for Next Visit Assess pts adherence to chosen goals     MONITORING & EVALUATION Dietary intake, weekly physical activity, body weight, and pre-op goals reached at next nutrition visit.    Next Steps  Patient is to follow up at [facility] for Pre-Op Class >2 weeks before surgery for further nutrition education.

## 2020-10-30 DIAGNOSIS — E118 Type 2 diabetes mellitus with unspecified complications: Secondary | ICD-10-CM | POA: Diagnosis not present

## 2020-10-30 DIAGNOSIS — E785 Hyperlipidemia, unspecified: Secondary | ICD-10-CM | POA: Diagnosis not present

## 2020-10-30 DIAGNOSIS — M1712 Unilateral primary osteoarthritis, left knee: Secondary | ICD-10-CM | POA: Diagnosis not present

## 2020-10-30 DIAGNOSIS — I10 Essential (primary) hypertension: Secondary | ICD-10-CM | POA: Diagnosis not present

## 2020-11-11 DIAGNOSIS — R6889 Other general symptoms and signs: Secondary | ICD-10-CM | POA: Diagnosis not present

## 2020-11-11 DIAGNOSIS — J209 Acute bronchitis, unspecified: Secondary | ICD-10-CM | POA: Diagnosis not present

## 2020-11-11 DIAGNOSIS — Z6841 Body Mass Index (BMI) 40.0 and over, adult: Secondary | ICD-10-CM | POA: Diagnosis not present

## 2020-11-11 DIAGNOSIS — Z20822 Contact with and (suspected) exposure to covid-19: Secondary | ICD-10-CM | POA: Diagnosis not present

## 2020-11-12 ENCOUNTER — Other Ambulatory Visit: Payer: Self-pay

## 2020-11-12 ENCOUNTER — Encounter: Payer: Medicare Other | Attending: General Surgery | Admitting: Skilled Nursing Facility1

## 2020-11-12 DIAGNOSIS — E119 Type 2 diabetes mellitus without complications: Secondary | ICD-10-CM | POA: Insufficient documentation

## 2020-11-12 DIAGNOSIS — Z794 Long term (current) use of insulin: Secondary | ICD-10-CM | POA: Insufficient documentation

## 2020-11-12 NOTE — Progress Notes (Signed)
Supervised Weight Loss Visit Bariatric Nutrition Education  Planned Surgery: sleeve gastrectomy   1 out of 6 SWL Appointments    NUTRITION ASSESSMENT  Anthropometrics  Start weight at [facility]: 324.7 lbs (date: 10/29/2020)  Weight: 318.8 pounds BMI: 48.47 kg/m2     Clinical  Medical hx: Asthma, diabetes, GERD, COPD, hyperlipidemia, HTN Medications: see lost: metformin, glipizide,omega 3, januvia, vitamin D Labs: none in the EMR Notable signs/symptoms: winded easier since covid in 2020 Any previous deficiencies? Iron, vitamin D  Lifestyle & Dietary Hx  Pt states his his wife has been logging what he eats and drinks which has helped overall with more awareness.  Pt states his blood sugars have been about 140 (taking prednisone)   Estimated daily fluid intake:  oz Supplements:  Current average weekly physical activity: ADL's plans to get back into the Y  24-Hr Dietary Recall First Meal: bacon and eggs Snack: atkins bar Second Meal: sandwich on whole wheat ham and cheese Snack: protein shake Third Meal: soup: vegetable  Snack:  Beverages: coffee, water  Estimated Energy Needs Calories: 1700   NUTRITION DIAGNOSIS  Overweight/obesity ([place]-3.3) related to past poor dietary habits and physical inactivity as evidenced by patient w/ planned sleeve gastrectomy surgery following dietary guidelines for continued weight loss.   NUTRITION INTERVENTION  Nutrition counseling (C-1) and education (E-2) to facilitate bariatric surgery goals.  Pre-Op Goals Progress & New Goals continue: Track food and beverage intake (pen and paper, MyFitness Pal, Baritastic app, etc.) continue: Make healthy food choices while monitoring portion sizes: add more veggies NEW: get back into water aerobics NEW: do not give in to the prednisone hunger  Handouts Provided Include    Learning Style & Readiness for Change Teaching method utilized: Visual & Auditory  Demonstrated degree of understanding  via: Teach Back  Readiness Level: Action Barriers to learning/adherence to lifestyle change: none stated  RD's Notes for next Visit  Assess pte adherence to chosen goals   MONITORING & EVALUATION Dietary intake, weekly physical activity, body weight, and pre-op goals in 1 month.   Next Steps  Patient is to return to [facility] for next appt

## 2020-12-12 ENCOUNTER — Other Ambulatory Visit: Payer: Self-pay

## 2020-12-12 ENCOUNTER — Encounter: Payer: Medicare Other | Attending: General Surgery | Admitting: Dietician

## 2020-12-12 ENCOUNTER — Encounter: Payer: Self-pay | Admitting: Dietician

## 2020-12-12 VITALS — Ht 68.0 in | Wt 320.9 lb

## 2020-12-12 DIAGNOSIS — Z6841 Body Mass Index (BMI) 40.0 and over, adult: Secondary | ICD-10-CM | POA: Insufficient documentation

## 2020-12-12 DIAGNOSIS — E119 Type 2 diabetes mellitus without complications: Secondary | ICD-10-CM | POA: Diagnosis not present

## 2020-12-12 DIAGNOSIS — E669 Obesity, unspecified: Secondary | ICD-10-CM | POA: Insufficient documentation

## 2020-12-12 DIAGNOSIS — E569 Vitamin deficiency, unspecified: Secondary | ICD-10-CM | POA: Diagnosis not present

## 2020-12-12 DIAGNOSIS — E611 Iron deficiency: Secondary | ICD-10-CM | POA: Insufficient documentation

## 2020-12-12 NOTE — Progress Notes (Signed)
Supervised Weight Loss Visit Bariatric Nutrition Education  Planned Surgery: sleeve gastrectomy   2 out of 6 SWL Appointments    NUTRITION ASSESSMENT  Anthropometrics  Start weight at [facility]: 324.7 lbs (date: 10/29/2020)  Weight: 320.9 pounds BMI: 48.47 kg/m2     Clinical  Medical hx: Asthma, diabetes, GERD, COPD, hyperlipidemia, HTN Medications: see lost: metformin, glipizide,omega 3, januvia, vitamin D Labs: none in the EMR Notable signs/symptoms: winded easier since covid in 2020 Any previous deficiencies? Iron, vitamin D  Lifestyle & Dietary Hx Pt wife Juliann Pulse is present for visit Pt has finished their course of prednisone. Pt reports appetite and blood sugars are back to normal. Pt has stopped logging foods, but is watching their portion sizes. Pt and their wife have been doing Keto off an on for 2 years.  Pt has restarted water aerobics with the [employer] program, 3x a week for 60-120 minutes. Pt states his his wife has been logging what he eats and drinks which has helped overall with more awareness.  Pt states his blood sugars have been about 140 (taking prednisone)   Estimated daily fluid intake:  96 oz Supplements: Daily MV Current average weekly physical activity: ADL's plans to get back into the Y  24-Hr Dietary Recall First Meal: 2 strips of bacon, 2 eggs, coffee Snack:  Second Meal: Potato Soup, water Snack:  Third Meal: Cookout burger, fries, onion rings, half sweet tea Snack:  Beverages: coffee, water  Estimated Energy Needs Calories: 1700   NUTRITION DIAGNOSIS  Overweight/obesity ([place]-3.3) related to past poor dietary habits and physical inactivity as evidenced by patient w/ planned sleeve gastrectomy surgery following dietary guidelines for continued weight loss.   NUTRITION INTERVENTION  Nutrition counseling (C-1) and education (E-2) to facilitate bariatric surgery goals.  Pre-Op Goals Progress & New Goals Continue: Track food and beverage  intake (pen and paper, MyFitness Pal, Baritastic app, etc.) continue: Make healthy food choices while monitoring portion sizes: add more veggies Continue: get back into water aerobics Continue do not give in to the prednisone hunger NEW: Take your metformin on a full stomach after a meal  Handouts Provided Include    Learning Style & Readiness for Change Teaching method utilized: Visual & Auditory  Demonstrated degree of understanding via: Teach Back  Readiness Level: Action Barriers to learning/adherence to lifestyle change: none stated  RD's Notes for next Visit  Assess pte adherence to chosen goals   MONITORING & EVALUATION Dietary intake, weekly physical activity, body weight, and pre-op goals in 1 month.   Next Steps  Patient is to return to [facility] for next appt

## 2021-01-10 ENCOUNTER — Other Ambulatory Visit: Payer: Self-pay

## 2021-01-10 ENCOUNTER — Encounter: Payer: Medicare Other | Attending: General Surgery | Admitting: Registered"

## 2021-01-10 DIAGNOSIS — J449 Chronic obstructive pulmonary disease, unspecified: Secondary | ICD-10-CM | POA: Diagnosis not present

## 2021-01-10 DIAGNOSIS — E611 Iron deficiency: Secondary | ICD-10-CM | POA: Diagnosis not present

## 2021-01-10 DIAGNOSIS — Z6841 Body Mass Index (BMI) 40.0 and over, adult: Secondary | ICD-10-CM | POA: Diagnosis not present

## 2021-01-10 DIAGNOSIS — E569 Vitamin deficiency, unspecified: Secondary | ICD-10-CM | POA: Insufficient documentation

## 2021-01-10 DIAGNOSIS — E119 Type 2 diabetes mellitus without complications: Secondary | ICD-10-CM | POA: Diagnosis not present

## 2021-01-10 NOTE — Patient Instructions (Signed)
Pre-Op Goals Progress & New Goals Continue: Track food and beverage intake (pen and paper, MyFitness Pal, Baritastic app, etc.) continue: Make healthy food choices while monitoring portion sizes: add more veggies - Continue: get back into water aerobics (Resolved) do not give in to the prednisone hunger NEW - consume 3 meals per day NEW - chew food to applesauce consistency.

## 2021-01-10 NOTE — Progress Notes (Signed)
Supervised Weight Loss Visit Bariatric Nutrition Education  Planned Surgery: sleeve gastrectomy   3 out of 6 SWL Appointments    NUTRITION ASSESSMENT  Anthropometrics  Start weight at [facility]: 324.7 lbs (date: 10/29/2020)  Weight: 316.0 pounds BMI: 48.05 kg/m2     Clinical  Medical hx: Asthma, diabetes, GERD, COPD, hyperlipidemia, HTN Medications: no change: metformin, glipizide, omega 3, januvia, vitamin D Labs: none in the EMR Notable signs/symptoms: winded easier since covid in 2020 Any previous deficiencies? Iron, vitamin D  Lifestyle & Dietary Hx Pt spouse Juliann Pulse is present for visit  Pt states he has not exercised x 2 weeks, busy with grandchildren while out of school. Next week Pt and wife are planning to start back water aerobics. Signed up for class at different time, only 2x/week. They plan to walk indoor track 1x/week.  Wife states she is still logging dietary intake, does not measure food, but is careful with portion sizes. Pt states they often skip lunch.  Pt reports fasting blood sugars is ~110 mg/dL  Goal of more veggies: Pt states eats 4-5 x week, hard to do on weekends running around, busy. Pt reports improving vegetable intake, often having a Meat and 2 vegetables instead of just 1 vegetable.  Estimated daily fluid intake: 3x 32 oz water + 2 c coffee Supplements: Daily MV Current average weekly physical activity: ADL's x2 weeks, plans to get back into the Y  24-Hr Dietary Recall (Ran out of snacks, but not hungry) First Meal: 2 strips of bacon, 2 eggs, 2 c coffee with Truvia, zero sugar creamer Snack:  Second Meal: none, wasn't hungry Snack:  Third Meal: beanie & weenies (~1.5 cups) Snack:  Beverages: coffee, water, rarely soda (last one was with dinner last week)  Estimated Energy Needs Calories: 1700   NUTRITION DIAGNOSIS  Overweight/obesity ([place]-3.3) related to past poor dietary habits and physical inactivity as evidenced by patient w/ planned  sleeve gastrectomy surgery following dietary guidelines for continued weight loss.   NUTRITION INTERVENTION  Nutrition counseling (C-1) and education (E-2) to facilitate bariatric surgery goals.  Pre-Op Goals Progress & New Goals Continue: Track food and beverage intake (pen and paper, MyFitness Pal, Baritastic app, etc.) Continue: Make healthy food choices while monitoring portion sizes: add more veggies - Continue: get back into water aerobics (Resolved) do not give in to the prednisone hunger NEW - consume 3 meals per day NEW - chew food to applesauce consistency.  Handouts Provided Include    Learning Style & Readiness for Change Teaching method utilized: Visual & Auditory  Demonstrated degree of understanding via: Teach Back  Readiness Level: Action Barriers to learning/adherence to lifestyle change: none stated  RD's Notes for next Visit  Assess pte adherence to chosen goals   MONITORING & EVALUATION Dietary intake, weekly physical activity, body weight, and pre-op goals in 1 month.   Next Steps  Patient is to return to [facility] for next appt

## 2021-02-03 DIAGNOSIS — E785 Hyperlipidemia, unspecified: Secondary | ICD-10-CM | POA: Diagnosis not present

## 2021-02-03 DIAGNOSIS — E611 Iron deficiency: Secondary | ICD-10-CM | POA: Diagnosis not present

## 2021-02-03 DIAGNOSIS — I10 Essential (primary) hypertension: Secondary | ICD-10-CM | POA: Diagnosis not present

## 2021-02-03 DIAGNOSIS — E1169 Type 2 diabetes mellitus with other specified complication: Secondary | ICD-10-CM | POA: Diagnosis not present

## 2021-02-03 DIAGNOSIS — E569 Vitamin deficiency, unspecified: Secondary | ICD-10-CM | POA: Diagnosis not present

## 2021-02-05 ENCOUNTER — Other Ambulatory Visit (HOSPITAL_COMMUNITY): Payer: Self-pay | Admitting: General Surgery

## 2021-02-05 DIAGNOSIS — E119 Type 2 diabetes mellitus without complications: Secondary | ICD-10-CM

## 2021-02-05 DIAGNOSIS — E611 Iron deficiency: Secondary | ICD-10-CM

## 2021-02-05 DIAGNOSIS — E569 Vitamin deficiency, unspecified: Secondary | ICD-10-CM

## 2021-02-10 ENCOUNTER — Ambulatory Visit (HOSPITAL_COMMUNITY)
Admission: RE | Admit: 2021-02-10 | Discharge: 2021-02-10 | Disposition: A | Discharge status: Home / Self Care | Payer: Medicare Other | Source: Ambulatory Visit | Attending: General Surgery | Admitting: General Surgery

## 2021-02-10 ENCOUNTER — Other Ambulatory Visit: Payer: Self-pay

## 2021-02-10 ENCOUNTER — Encounter: Payer: Self-pay | Admitting: Registered"

## 2021-02-10 ENCOUNTER — Encounter: Payer: Medicare Other | Attending: General Surgery | Admitting: Registered"

## 2021-02-10 ENCOUNTER — Encounter (HOSPITAL_COMMUNITY)
Admission: RE | Admit: 2021-02-10 | Discharge: 2021-02-10 | Disposition: A | Discharge status: Home / Self Care | Payer: Medicare Other | Source: Ambulatory Visit | Attending: General Surgery | Admitting: General Surgery

## 2021-02-10 DIAGNOSIS — E611 Iron deficiency: Secondary | ICD-10-CM | POA: Insufficient documentation

## 2021-02-10 DIAGNOSIS — E569 Vitamin deficiency, unspecified: Secondary | ICD-10-CM | POA: Diagnosis not present

## 2021-02-10 DIAGNOSIS — Z01818 Encounter for other preprocedural examination: Secondary | ICD-10-CM | POA: Diagnosis not present

## 2021-02-10 DIAGNOSIS — Z713 Dietary counseling and surveillance: Secondary | ICD-10-CM | POA: Diagnosis not present

## 2021-02-10 DIAGNOSIS — E119 Type 2 diabetes mellitus without complications: Secondary | ICD-10-CM | POA: Diagnosis not present

## 2021-02-10 NOTE — Progress Notes (Signed)
Supervised Weight Loss Visit Bariatric Nutrition Education  Planned Surgery: sleeve gastrectomy   4 out of 6 SWL Appointments   NUTRITION ASSESSMENT  Anthropometrics  Start weight at [facility]: 324.7 lbs (date: 10/29/2020)  Weight: 318.0 pounds BMI: 48.05 kg/m2     Clinical  Medical hx: Asthma, diabetes, GERD, COPD, hyperlipidemia, HTN Medications: no change: metformin, glipizide, omega 3, januvia, vitamin D Labs: none in the EMR Notable signs/symptoms: winded easier since covid in 2020 Any previous deficiencies? Iron, vitamin D  Lifestyle & Dietary Hx Pt spouse Juliann Pulse is present for visit  Pt states he has not exercised for past 3 weeks, taking care of father-in-law. Pt states his own fitness goal is to burn 570 calories per day and he reaches goal daily. 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   Pt reports fasting blood sugars is ~90 mg/dL  Goal of more veggies: Pt states he continues to improve vegetable intake.  Patient makes a lot of food for one meal and they will eat for 2 meals in a row. Pt states they are getting better at not skipping meals.  Pt states he knows he likes protein shakes, is not drinking any now but will get some to have on hand for after surgery.   Estimated daily fluid intake: 3x 32 oz water + 2 c coffee Supplements: Daily MV Current average weekly physical activity: ADL, caregiving  24-Hr Dietary Recall First Meal: 2 strips of bacon, 2 eggs, 2 c coffee with Truvia, zero sugar creamer Snack:  Second Meal: spaghetti, parmesan cheese, meat (80% lean) low sodium tomato sauce Snack:  Third Meal: same as lunch Snack:  Beverages: coffee, water,   Estimated Energy Needs Calories: 1700   NUTRITION DIAGNOSIS  Overweight/obesity ([place]-3.3) related to past poor dietary habits and physical inactivity as evidenced by patient w/ planned sleeve gastrectomy surgery following dietary guidelines  for continued weight loss.   NUTRITION INTERVENTION  Nutrition counseling (C-1) and education (E-2) to facilitate bariatric surgery goals.  Pre-Op Goals Progress & New Goals Continue: Track food and beverage intake (pen and paper, MyFitness Pal, Baritastic app, etc.) Continue: Make healthy food choices while monitoring portion sizes: add more veggies - Continue: get back into water aerobics (Resolved) do not give in to the prednisone hunger Continue - consume 3 meals per day - getting better 4-5x/week Continue - chew food to applesauce consistency, states is doing always. NEW - Switch to decaf coffee NEW - Start calculating the grams of saturated fat, keep to single digits each meal  Handouts Provided Include  none  Learning Style & Readiness for Change Teaching method utilized: Visual & Auditory  Demonstrated degree of understanding via: Teach Back  Readiness Level: Action Barriers to learning/adherence to lifestyle change: none stated  RD's Notes for next Visit  Assess pte adherence to chosen goals   MONITORING & EVALUATION Dietary intake, weekly physical activity, body weight, and pre-op goals in 1 month.   Next Steps  Patient is to return to [facility] for next appt

## 2021-02-10 NOTE — Patient Instructions (Addendum)
Continue with goals you are currently working on. Add: Switch to decaf coffee Start calculating the grams of saturated fat, keep to single digits each meal

## 2021-03-04 ENCOUNTER — Ambulatory Visit: Payer: Medicare Other | Admitting: Sports Medicine

## 2021-03-04 ENCOUNTER — Encounter: Payer: Self-pay | Admitting: Sports Medicine

## 2021-03-04 ENCOUNTER — Other Ambulatory Visit: Payer: Self-pay

## 2021-03-04 DIAGNOSIS — B351 Tinea unguium: Secondary | ICD-10-CM

## 2021-03-04 DIAGNOSIS — M2042 Other hammer toe(s) (acquired), left foot: Secondary | ICD-10-CM

## 2021-03-04 DIAGNOSIS — M79675 Pain in left toe(s): Secondary | ICD-10-CM

## 2021-03-04 DIAGNOSIS — M79674 Pain in right toe(s): Secondary | ICD-10-CM | POA: Diagnosis not present

## 2021-03-04 DIAGNOSIS — M2011 Hallux valgus (acquired), right foot: Secondary | ICD-10-CM

## 2021-03-04 DIAGNOSIS — M2012 Hallux valgus (acquired), left foot: Secondary | ICD-10-CM

## 2021-03-04 DIAGNOSIS — E1142 Type 2 diabetes mellitus with diabetic polyneuropathy: Secondary | ICD-10-CM | POA: Diagnosis not present

## 2021-03-04 DIAGNOSIS — M2041 Other hammer toe(s) (acquired), right foot: Secondary | ICD-10-CM

## 2021-03-04 NOTE — Progress Notes (Signed)
Subjective: [name] [name] is a 71 y.o. male patient with history of diabetes who presents to office today complaining of long,mildly painful nails  while ambulating in shoes; unable to trim. Patient states that the glucose reading this morning was not recorded, does not know last A1c, Last PCP Cyndi Bender, PA-C visit was 1- month ago.  States that he may have gastric surgery in May.   No other issues noted.   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     aspirin EC 81 MG tablet Take 1 tablet by mouth daily.     atorvastatin (LIPITOR) 20 MG tablet Take 20 mg by mouth at bedtime.      Blood Glucose Monitoring Suppl (FIFTY50 GLUCOSE METER 2.0) w/Device KIT Use as instructed     Calcium Carb-Cholecalciferol 500-400 MG-UNIT CHEW Chew 1 tablet by mouth daily.     diclofenac Sodium (VOLTAREN) 1 % GEL SMARTSIG:Gram(s) Topical 1-4 Times Daily     fluticasone (FLONASE) 50 MCG/ACT nasal spray Place 2 sprays into both nostrils daily. (Patient taking differently: Place 2 sprays into both nostrils daily as needed. ) 16 g 2   fluticasone furoate-vilanterol (BREO ELLIPTA) 200-25 MCG/INH AEPB Inhale 1 puff into the lungs daily. (Patient not taking: Reported on 04/26/2019) 60 each 3   glipiZIDE (GLUCOTROL XL) 10 MG 24 hr tablet Take 10 mg by mouth daily.     ibuprofen (ADVIL) 800 MG tablet Take 1 tablet (800 mg total) by mouth every 8  (eight) hours as needed. 30 tablet 0   Lancets (ACCU-CHEK MULTICLIX) lancets      meloxicam (MOBIC) 15 MG tablet Take 15 mg by mouth as needed.   0   metFORMIN (GLUCOPHAGE) 500 MG tablet Take 1 tablet by mouth daily.      metFORMIN (GLUCOPHAGE-XR) 500 MG 24 hr tablet Take 1,000 mg by mouth 2 (two) times daily.     montelukast (SINGULAIR) 10 MG tablet Take 10 mg by mouth at bedtime.      Multiple Vitamin (MULTI-VITAMINS) TABS Take 1 tablet by mouth daily.     Omega-3 Fatty Acids (SM FISH OIL) 1000 MG CAPS Take 1 capsule by mouth daily.      ONETOUCH VERIO test strip USE 1 STRIP TO CHECK GLUCOSE ONCE DAILY FOR BLOOD SUGAR  1   oxyCODONE (OXY IR/ROXICODONE) 5 MG immediate release tablet Take 1 tablet (5 mg total) by mouth every 6 (six) hours as needed for severe pain. 15 tablet 0   pantoprazole (PROTONIX) 20 MG tablet Take 20 mg by mouth daily.     sitaGLIPtin (JANUVIA) 50 MG tablet Take 50 mg by mouth daily.      telmisartan-hydrochlorothiazide (MICARDIS HCT) 40-12.5 MG tablet Take 1 tablet by mouth daily.      No current facility-administered medications on file prior to visit.   Allergies  Allergen Reactions   Mercury Swelling    No results found for this or any  previous visit (from the past 2160 hour(s)).  Objective: General: Patient is awake, alert, and oriented x 3 and in no acute distress.  Integument: Skin is warm, dry and supple bilateral. Nails are tender, long, thickened and dystrophic with subungual debris, consistent with onychomycosis, 1-5 bilateral. No signs of infection. No open lesions or preulcerative lesions present bilateral. Remaining integument unremarkable.  Vasculature:  Dorsalis Pedis pulse 1/4 bilateral. Posterior Tibial pulse  1/4 bilateral. Capillary fill time <3 sec 1-5 bilateral. Positive hair growth to the level of the digits.Temperature gradient within normal limits. No varicosities present bilateral. No edema present bilateral.   Neurology: The patient has  intact via light touch bilateral.   Musculoskeletal: Asymptomatic pes planus and Syndactyl of toes 2-3 pedal deformities noted bilateral. Muscular strength 5/5 in all lower extremity muscular groups bilateral without pain on range of motion . No tenderness with calf compression bilateral.  Assessment and Plan: Problem List Items Addressed This Visit   None Visit Diagnoses     Pain due to onychomycosis of toenails of both feet    -  Primary   Diabetic peripheral neuropathy associated with type 2 diabetes mellitus (HCC)       Hallux valgus, acquired, bilateral       Acquired hammertoes of both feet          -Examined patient. -Re-Discussed and educated patient on diabetic foot care, especially with  regards to the vascular, neurological and musculoskeletal systems.  -Mechanically debrided all nails 1-5 bilateral using sterile nail nipper and filed with dremel without incident  -Answered all patient questions -Patient to return  in 3-4 months for at risk foot care depending on when he has Gastric surgery  -Patient advised to call the office if any problems or questions arise in the meantime.  Landis Martins, DPM

## 2021-03-10 ENCOUNTER — Other Ambulatory Visit: Payer: Self-pay

## 2021-03-10 ENCOUNTER — Encounter: Payer: Medicare Other | Attending: General Surgery | Admitting: Registered"

## 2021-03-10 DIAGNOSIS — Z713 Dietary counseling and surveillance: Secondary | ICD-10-CM | POA: Diagnosis not present

## 2021-03-10 DIAGNOSIS — E611 Iron deficiency: Secondary | ICD-10-CM | POA: Diagnosis not present

## 2021-03-10 DIAGNOSIS — Z6841 Body Mass Index (BMI) 40.0 and over, adult: Secondary | ICD-10-CM | POA: Diagnosis not present

## 2021-03-10 DIAGNOSIS — E119 Type 2 diabetes mellitus without complications: Secondary | ICD-10-CM | POA: Insufficient documentation

## 2021-03-10 DIAGNOSIS — E569 Vitamin deficiency, unspecified: Secondary | ICD-10-CM | POA: Diagnosis not present

## 2021-03-10 NOTE — Progress Notes (Signed)
Supervised Weight Loss Visit ?Bariatric Nutrition Education ? ?Planned Surgery: sleeve gastrectomy  ? ?5 out of 6 SWL Appointments  ? ?NUTRITION ASSESSMENT ? ?Anthropometrics  ?Start weight at [facility]: 324.7 lbs (date: 10/29/2020)  ?Weight: 314.7 pounds ?BMI: 48.05 kg/m2   ?  ?Clinical  ?Medical hx: Asthma, diabetes, GERD, COPD, hyperlipidemia, HTN ?Medications: no change: metformin, glipizide, omega 3, januvia, vitamin D ?Labs: none in the EMR ?Notable signs/symptoms: winded easier since covid in 2020 ?Any previous deficiencies? Iron, vitamin D ? ?Lifestyle & Dietary Hx ?Pt spouse Juliann Pulse is present for visit ? 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 Pt states his father-in-law has to keep active using his walker and pt walks with him.  ? ?Pt reports he has cut back bacon and pork, and working on getting vegetables daily. Pt reports that when they eat rice they use canned or frozen of vegetables to a package of knorr rice. Doesn't eat much fruit. ? ?Goal of more veggies: Pt states he continues to improve vegetable Pt states they are eating about the same portions making enough food at one meal to have leftovers at the next meal.  ? ?Estimated daily fluid intake: 3x 32 oz water + 2 c coffee ?Supplements: Daily MV, stopped taking calcium because dr said was too high in blood. ?Current average weekly physical activity: ADL fitness tracker goal of burning 600 calories now, app self adjusts goal. RD suggested tracking steps to have a way to quantify progress that is easy to understand. ? ?24-Hr Dietary Recall ?First Meal: 2 strips of bacon, 2 eggs, multi-grain toast, 2 c decaf coffee with Truvia, zero sugar creamer ?Snack:  ?Second Meal: tacos, lettuce, cheese, sour cream ?Snack:  ?Third Meal: pork chops, rice and peas (night before, green beans, corn, chicken) ?Snack:  ?Beverages: coffee, water,  ? ?Estimated Energy Needs ?Calories: 1700 ? ?NUTRITION DIAGNOSIS  ?Overweight/obesity ([place]-3.3) related to past poor dietary  habits and physical inactivity as evidenced by patient w/ planned sleeve gastrectomy surgery following dietary guidelines for continued weight loss. ? ?NUTRITION INTERVENTION  ?Nutrition counseling (C-1) and education (E-2) to facilitate bariatric surgery goals. ? ?Pre-Op Goals Progress & New Goals ?Continue: Track food and beverage intake (pen and paper, MyFitness Pal, Baritastic app, etc.) ?Continue: Make healthy food choices while monitoring portion sizes: add more veggies  ?Continue: get back into water aerobics ?(Resolved) do not give in to the prednisone hunger ?Continue - consume 3 meals per day -  ?getting better, most days now ?Continue - chew food to applesauce consistency (meeting goal) ?Continue - Switch to decaf coffee ?Continue - Start calculating the grams of saturated fat, keep to single digits each meal ?Research bariatric vitamins ? ?Handouts Provided Include  ?none ? ?Learning Style & Readiness for Change ?Teaching method utilized: Visual & Auditory  ?Demonstrated degree of understanding via: Teach Back  ?Readiness Level: Action ?Barriers to learning/adherence to lifestyle change: none stated ? ?RD's Notes for next Visit  ?Assess pt adherence to chosen goals ? ?MONITORING & EVALUATION ?Dietary intake, weekly physical activity, body weight, and pre-op goals in 1 month.  ? ?Next Steps  ?Patient is to return to [facility] for next appt ? ?

## 2021-03-11 ENCOUNTER — Encounter: Payer: Self-pay | Admitting: Gastroenterology

## 2021-03-26 ENCOUNTER — Ambulatory Visit (INDEPENDENT_AMBULATORY_CARE_PROVIDER_SITE_OTHER): Payer: Medicare Other | Admitting: Psychology

## 2021-03-26 DIAGNOSIS — F509 Eating disorder, unspecified: Secondary | ICD-10-CM

## 2021-03-26 NOTE — Progress Notes (Addendum)
Date of Evaluation: 03/26/2021 ?Time Seen: 10am ?Duration: 75 minutes ?Type of Session: Inittial Appointment for Evaluation  ?Location of Patient: Home (private location) ?Location of Provider: At home in a private office due to COVID-19 pandemic ?Type of Contact: WebEx video visit with audio ? ?Prior to proceeding with today's appointment, two pieces of identifying information were obtained from [facility] to verify identity. In addition, [name] [name]'s physical location at the time of this appointment was obtained. In the event of technical difficulties, [name] [name] shared a phone number they could be reached at. [name] [name] and this provider participated in today's telepsychological service. [name] [name] denied anyone else being present in the room or on the virtual appointment.The provider's role was explained to [name] [name]. The provider reviewed and discussed issues of confidentiality, privacy, and limits therein (e.g., reporting obligations). In addition to verbal informed consent, written informed consent for psychological services was obtained from [name] [name] prior to the initial intake interview. Written consent included information concerning the practice, financial arrangements, and confidentiality and patients' rights. Since the clinic is not a 24/7 crisis center, mental health emergency resources were shared, and the provider explained MyChart, e-mail, voicemail, and/or other messaging systems should be utilized only for non-emergency reasons. This provider also explained that information obtained during appointments will be placed in their electronic medical chart in a confidential manner. [name] [name] verbally acknowledged understanding of the aforementioned and agreed to use mental health emergency resources discussed if needed. Moreover, [name] [name] agreed information may be shared with other [facility] providers as needed for coordination of care. By signing the new patient documents, [name] [name] provided written consent for  coordination of care. [name] [name] verbally acknowledged understanding he is ultimately responsible for understanding his insurance benefits as it relates to reimbursement of telepsychological and in-person services. This provider also reviewed confidentiality, as it relates to telepsychological services, as well as the rationale for telepsychological services. More specifically, this provider's clinic is providing telepsychological services to reduce exposure to COVID-19. The patient expressed understanding regarding the rationale for telepsychological services. [name] [name] verbally consented to proceed. ? ?[name] [name] completed the psychiatric diagnostic evaluation (complete history, including past, family, and social history, psychiatric history, and establishment of a tentative diagnosis) and the bariatric assessment for a total of 75 minutes. Code [phone] was billed.  ? ?Additionally, during this initial appointment this provider completed the scoring of the following measures: Beck Anxiety Inventory (BAI) (5 minutes), Beck?s Depression Index (5 minutes), Eating Disorder Diagnostic Scale (EDDS) (10 minutes [administration and scoring]), Mini Mental Status Examination (MMSE) (10 minutes [administration and scoring]), & Mood Disorder Questionnaire (MDQ) (5 minutes [administration and scoring]). A total of 35 minutes was spent on the scoring of the aforementioned measures and code [phone] was billed.  ? ?Mental Status Examination:  ?Appearance:  neat ?Behavior: appropriate to circumstances ?Mood: euthymic ?Affect: mood congruent ?Speech: WNL ?Eye Contact: appropriate ?Psychomotor Activity: WNL ?Thought Process: linear, logical, and goal directed and denies suicidal, homicidal, and self-harm ideation, plan and intent ?Content/Perceptual Disturbances: none ?Orientation: AAOx4 ?Cognition/Sensorium: intact ?Insight: good ?Judgment: good ? ?Time Requirements: ?Interview: 75 minutes (billing code [phone]) ?Assessment scoring and interpreting:  35 minutes (billing code [phone]) ? ?DSM-5 Diagnosis(es) Code: E66.01 Morbid Obesity ? ?Plan: [name] [name] is currently scheduled for a feedback appointment on 04/16/2021 at 10am via WebEx video visit with audio.  ? ? ? ?Bariatric Evaluation  ?  ?CONFIDENTIAL  ?  ?Client Name: [name] [name]  MRN: [id number] ?Date of Birth: 04/07/1950                                                              Date of Evaluation: 03/26/2021 ?Total Assessment Time: 13 Minutes                                          Date of Report: 03/28/2021 ?Evaluator: [name] [name], Psy.D.                                 Referring Physician: Dr. Gurney Maxin, [facility] ? ? ? ?Reason for Referral: [name] [name] reported he was referred for an evaluation to determine if he is ?okay to have the surgery done.? This evaluator explained his role and he verbally consented to proceed. Per referral paperwork, his ?major reason for weight loss is getting a left knee replacement,? he is pursuing laparoscopic sleeve gastrectomy, and ?he is an appropriate candidate for surgery and has appropriate goals.? ? ?Sources of Information ?Clinical Interview ?Bariatric Questionnaire ?[place] Interview for Gastric Bypass ?Beck Anxiety Inventory (BAI) ?Beck Depression Inventory, 2nd Edition (BDI-II) ?Eating Disorder Diagnostic Scale (EDDS) ?Mini-Mental State Examination (MMSE)  ?Mood Disorder Questionnaire (MDQ) ?Review of Medical Record (provided by [facility]) ? ?Patient Identification and Chief Complaint: [name] [name] currently resides in [place], [place] noting he lives with his wife whom he gets along with ?great.? He stated he completed one year of college. He denied a history of learning diagnosis or grade retentions. [name] [name] reported he is currently retired.  ? ?[name] [name] discussed a belief weight loss may help with being able to ?have surgery on [his] left knee.? [name] [name] shared his social support system  consists of his wife, daughter, grandchildren, and pastor. Mr. Cervi reported a belief there would be a positive impact on his relationships if he were to lose weight, adding it would improve his mobility and allow him to engage in more physical activities with others. 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   ? ?[name] and referral paperwork reported his medical history is significant for the following: diabetes, left knee arthritis, and obstructive sleep apnea. Mr. Fullbright and referral paperwork noted his surgical history is significant for umbilical hernia repair, gallbladder removal, and lap cholecystectomy.  His family medical history is significant for skin cancer (father), cancer (mother), and heart attack (brother). Mr. Brander reported he is medication compliant and does not have any issues with his current medications.  ? ?Current Medications: ?Albuterol 90MCG ?Fluticasone Furoate-Vilanterol 200-23MCG ?Ibuprofen '800MG'$  ?Pantaprazole '20MG'$  DR ?Sitagliptin '100MG'$  ?Atorvastatin '20MG'$  ?Calcium Carbonate-Vitamin D3 '500MG'$ -10MCG ?Cholecalciferol, Vitamin D3 ?Diclofenac 1% ?Docosaheaenoic acid-epa 120-'180MG'$  ?Glipizide '10MG'$  XL ?Metformin '500MG'$  XR  ?Montelukast '10MG'$  ?Multivitamin ?Telmisartan-Hydrocholorothiazide 40-12.'5MG'$  ? ?Mr. Bluitt denied ever meeting full criteria for a depressive episode; anxiety; panic attacks; abuse or neglect; and suicidal ideation, plan, or intent. He also denied ever using psychotropic medication, psychotherapeutic services, or being hospitalized for a psychiatric concern. He reported ceasing use of tobacco ?over 25 years ago.? He denied use of all other recreational and illicit substances. He shared his  familial history is significant for unspecified drug abuse (sister).  ? ?Patient's Understanding of the Procedure/Risks of Surgery: Mr. Malina reported having a friend that previously underwent bariatric surgery, adding he is ?doing well? and the friend is  assisting him through process. He denied having any concerns about bariatric surgery because of his friend's experience. He stated he is pursuing a gastric sleevectomy, noting it involves ?being sedated,? an ?

## 2021-04-03 DIAGNOSIS — Z9181 History of falling: Secondary | ICD-10-CM | POA: Diagnosis not present

## 2021-04-03 DIAGNOSIS — E785 Hyperlipidemia, unspecified: Secondary | ICD-10-CM | POA: Diagnosis not present

## 2021-04-03 DIAGNOSIS — Z0000 Encounter for general adult medical examination without abnormal findings: Secondary | ICD-10-CM | POA: Diagnosis not present

## 2021-04-03 DIAGNOSIS — Z1331 Encounter for screening for depression: Secondary | ICD-10-CM | POA: Diagnosis not present

## 2021-04-09 ENCOUNTER — Ambulatory Visit: Payer: Medicare Other | Admitting: Registered"

## 2021-04-14 ENCOUNTER — Ambulatory Visit: Payer: Medicare Other | Admitting: Skilled Nursing Facility1

## 2021-04-16 ENCOUNTER — Ambulatory Visit (INDEPENDENT_AMBULATORY_CARE_PROVIDER_SITE_OTHER): Payer: Medicare Other | Admitting: Psychology

## 2021-04-16 DIAGNOSIS — F509 Eating disorder, unspecified: Secondary | ICD-10-CM

## 2021-04-16 NOTE — Progress Notes (Signed)
Date of Appointment: 04/16/2021  ?Time Seen: 10am ?Duration: 20 total minutes (15 minutes feedback and 5 minutes report writing) ?Type of Session: Follow-up Appointment for Evaluation  ?Location of Patient: Home (private location) ?Location of Provider: At home in a private office due to COVID-19 pandemic ?Type of Contact: WebEx video visit with audio ? ?[name] [name] participated in the session via WebEx video visit with audio, from the privacy of their home due to the COVID-19 pandemic. [name] [name] provided verbal consent to proceed with today's appointment. This provider participated from a private home office. Today's interactive feedback session was completed which included reviewing the following measures: Beck Anxiety Inventory (BAI), Beck?s Depression Index (BDI), Eating Disorder Diagnostic Scale (EDDS), Mental Status Examination (MSE), & Mood Disorder Questionnaire (MDQ). During this interactive feedback session, this provider and [name] [name] discussed the results of the aforementioned measures, treatment recommendations, and the final determination regarding the psychological approval for bariatric surgery.  ? ?Please see the bariatric assessment at the bottom of this note for additional details. This provider completed the written report which includes integration of patient data, interpretation of standardized test results, interpretation of clinical data, review of referral from surgeon & clinical decision making (100 minutes in total). ? ?The interactive feedback session was completed today and a total of 20 minutes was spent on feedback and report writing. No billing code for the feedback appointment. ? ?Time Requirements: ?Feedback: 20 total minutes (no billing code) ?Report writing: 100 total minutes. 03/24/2021 12:40-1:15pm. 03/28/2021: 7:30-8:35am.  (billing codes [phone] and [phone]) ? ?DSM-5 Diagnosis(es) code: E66.01 Morbid Obesity ? ?Plan: [name] [name] provided verbal consent for his evaluation to be sent to [facility]. No further follow-up planned by this provider.   ? ? ?Bariatric Evaluation  ?  ?CONFIDENTIAL  ?  ?Client Name: [name] [name]                       MRN: [id number] ?Date of Birth: 1950/01/20                                                              Date of Evaluation: 03/26/2021 ?Total Assessment Time: 84 Minutes                                          Date of Report: 04/16/2021 ?Evaluator: [name] [name], Psy.D.                                 Referring Physician: Dr. Gurney [name], [facility] ? ?Reason for Referral: Mr. [name] [name] reported he was referred for an evaluation to determine if he is ?okay to have the surgery done.? This evaluator explained his role and he verbally consented to proceed. Per referral paperwork, his ?major reason for weight loss is getting a left knee replacement,? he is pursuing laparoscopic sleeve gastrectomy, and ?he is an appropriate candidate for surgery and has appropriate goals.? ? ?Sources of Information ?Clinical Interview ?Bariatric Questionnaire ?[place] Interview for Gastric Bypass ?Beck Anxiety Inventory (BAI) ?Beck Depression Inventory, 2nd Edition (BDI-II) ?Eating Disorder Diagnostic Scale (EDDS) ?Mini-Mental State Examination (MMSE)  ?Mood Disorder  Questionnaire (MDQ) ?Review of Medical Record (provided by [facility]) ? ?Patient Identification and Chief Complaint: [name] [name] currently resides in [place], [place] noting he lives with his wife whom he gets along with ?great.? He stated he completed one year of college. He denied a history of learning diagnosis or grade retentions. [name] [name] reported he is currently retired.  ? ?[name] [name] discussed a belief weight loss may help with being able to ?have surgery on [his] left knee.? [name] [name] shared his social support system consists of his wife, daughter, grandchildren, and pastor. [name] [name] reported a belief there would be a positive impact on his relationships if he were to  lose weight, adding it would improve his mobility and allow him to engage in more physical activities with others. 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   ? ?[name] [name] and referral paperwork reported his medical history is significant for the following: diabetes, left knee arthritis, and obstructive sleep apnea. Mr. [name] [name] and referral paperwork noted his surgical history is significant for umbilical hernia repair, gallbladder removal, and lap cholecystectomy.  His family medical history is significant for skin cancer (father), cancer (mother), and heart attack (brother). Mr. [name] [name] reported he is medication compliant and does not have any issues with his current medications.  ? ?Current Medications: ?Albuterol 90MCG ?Fluticasone Furoate-Vilanterol 200-23MCG ?Ibuprofen '800MG'$  ?Pantaprazole '20MG'$  DR ?Sitagliptin '100MG'$  ?Atorvastatin '20MG'$  ?Calcium Carbonate-Vitamin D3 '500MG'$ -10MCG ?Cholecalciferol, Vitamin D3 ?Diclofenac 1% ?Docosaheaenoic acid-epa 120-'180MG'$  ?Glipizide '10MG'$  XL ?Metformin '500MG'$  XR  ?Montelukast '10MG'$  ?Multivitamin ?Telmisartan-Hydrocholorothiazide 40-12.'5MG'$  ? ?Mr. [name] [name] denied ever meeting full criteria for a depressive episode; anxiety; panic attacks; abuse or neglect; and suicidal ideation, plan, or intent. He also denied ever using psychotropic medication, psychotherapeutic services, or being hospitalized for a psychiatric concern. He reported ceasing use of tobacco ?over 25 years ago.? He denied use of all other recreational and illicit substances. He shared his familial history is significant for unspecified drug abuse (sister).  ? ?Patient's Understanding of the Procedure/Risks of Surgery: Mr. [name] [name] reported having a friend that previously underwent bariatric surgery, adding he is ?doing well? and the friend is assisting him through process. He denied having any concerns about bariatric surgery because of his friend's experience. He stated he is pursuing a gastric  sleevectomy, noting it involves ?being sedated,? an ?incision in the abdomen,? and a ?sleeve created that is banana shaped.? He described awareness of anesthesia being administered during the procedure and noted the surgery can cause death, infection, nausea, vomiting, diarrhea, and nutritional deficiencies. He reported there would be a postoperative hospital stay and he would be unable to return to work/daily activities for two-to-three weeks. He shared his primary motivations for the procedure include increased day-to-day mobility, feeling more comfortable when socializing with others, improved health, improved appearance, and enhanced relationship with his wife. He expressed an expectation to lose at least 55 pounds following the surgery, noting maximum weight loss could take at least one year.  ? ?Mr. Waymire reported confidence in his ability to implement food restrictions following gastric surgery, noting his friend previously underwent surgery and has ?done well,? he is aware of the negative repercussions of not following dietary requirements, and reminding himself of his long-term goals and benefits for his health. He also discussed successful efforts to eat healthier food options (e.g., increased vegetable and whole grain), looking at nutritional labels, buying from [employer], practicing chewing food to ?applesauce consistency,? infrequent use of carbonated beverages, increasing exercise by going to the  [employer] and doing water aerobics, tracking his steps with his family, which has led to a loss of 40lbs. He expressed confidence in his ability to obtain and prepare food. He stated a belief his living environment would assist him in his attempts to control his eating. Following surgery, he reported awareness he will be primarily consuming liquids and portion sizes will be small. When able to start eating solid foods, he indicated he would have to consume vegetables, whole grains, lean meat, and  non-sugary fruits. Mr. Corrales reported awareness he would have to avoid ?processed,? ?heavy?, high sodium, and sugary foods; sugary beverages; and alcohol. This evaluator notified him of the need to cease ca

## 2021-04-17 ENCOUNTER — Encounter: Payer: Medicare Other | Attending: General Surgery | Admitting: Skilled Nursing Facility1

## 2021-04-17 DIAGNOSIS — Z713 Dietary counseling and surveillance: Secondary | ICD-10-CM | POA: Insufficient documentation

## 2021-04-17 DIAGNOSIS — E119 Type 2 diabetes mellitus without complications: Secondary | ICD-10-CM | POA: Insufficient documentation

## 2021-04-17 DIAGNOSIS — Z6841 Body Mass Index (BMI) 40.0 and over, adult: Secondary | ICD-10-CM | POA: Insufficient documentation

## 2021-04-17 DIAGNOSIS — E611 Iron deficiency: Secondary | ICD-10-CM | POA: Insufficient documentation

## 2021-04-17 DIAGNOSIS — Z794 Long term (current) use of insulin: Secondary | ICD-10-CM | POA: Insufficient documentation

## 2021-04-17 DIAGNOSIS — E569 Vitamin deficiency, unspecified: Secondary | ICD-10-CM | POA: Insufficient documentation

## 2021-04-17 NOTE — Progress Notes (Signed)
Supervised Weight Loss Visit ?Bariatric Nutrition Education ? ?Planned Surgery: sleeve gastrectomy  ? ?6 out of 6 [facility] Appointments  ? ?Pt completed visits. ?  ?Pt has cleared nutrition requirements.  ? ? ?NUTRITION ASSESSMENT ? ?Anthropometrics  ?Start weight at [facility]: 324.7 lbs (date: 10/29/2020)  ?Weight: 318 pounds ?BMI: 48.35 kg/m2   ?  ?Clinical  ?Medical hx: Asthma, diabetes, GERD, COPD, hyperlipidemia, HTN ?Medications: no change: metformin, glipizide, omega 3, januvia, vitamin D ?Labs: none in the EMR ?Notable signs/symptoms: winded easier since covid in 2020 ?Any previous deficiencies? Iron, vitamin D ? ?Lifestyle & Dietary Hx ?Pt spouse Juliann Pulse is present for visit ? ?Pt states he has learned how to eat slower and watch what he eats and chewing well. ?Pt states he is getting excited for the surgery. ?Pt states he recognizes he will really have to focus on his portions sizes  ?Estimated daily fluid intake: 3x 32 oz water + 2 c coffee ?Supplements: Daily MV, stopped taking calcium because dr said was too high in blood. ?Current [facility].  [facility] and [facility] to do so. ? ?24-Hr Dietary Recall ?First Meal: 2 strips of bacon, 2 eggs, multi-grain toast, 2 c decaf coffee with Truvia, zero sugar creamer ?Snack:  ?Second Meal: [place] steak and peas and mashed potatoes  ?Snack:  ?Third Meal: [place] steak and peas and mashed potatoes  ?Snack:  ?Beverages: coffee, water ? ?Estimated Energy Needs ?Calories: 1700 ? ?NUTRITION DIAGNOSIS  ?Overweight/obesity ([place]-3.3) related to past poor dietary habits and physical inactivity as evidenced by patient w/ planned sleeve gastrectomy surgery following dietary guidelines for continued weight loss. ? ?NUTRITION INTERVENTION  ?Nutrition counseling (C-1) and education (E-2) to facilitate bariatric surgery goals. ? ?Pre-Op Goals Progress & New Goals ?Continue:  Track food and beverage intake (pen and paper, MyFitness Pal, [employer]itastic app, etc.) ?Continue: Make healthy food choices while monitoring portion sizes: add more veggies  ?Continue: get back into water aerobics ?(Resolved) do not give in to the prednisone hunger ?Continue - consume 3 meals per day -  ?getting better, most days now ?Continue - chew food to applesauce consistency (meeting goal) ?Continue - Switch to decaf coffee ?Continue - Start calculating the grams of saturated fat, keep to single digits each meal ?Research bariatric vitamins ? ?Handouts Provided Include  ?none ? ?Learning Style & Readiness for Change ?Teaching method utilized: Visual & Auditory  ?Demonstrated degree of understanding via: Teach Back  ?Readiness Level: Action ?Barriers to learning/adherence to lifestyle change: none stated ? ?RD's Notes for next Visit  ?Assess pt adherence to chosen goals ? ?MONITORING & EVALUATION ?Dietary intake, weekly physical activity, body weight, and pre-op goals in 1 month.  ? ?Next Steps  ?Pt has completed visits. No further supervised visits required/recomended  ?Pre-op class to be scheduled at least 2 weeks before surgery date. ?

## 2021-04-30 ENCOUNTER — Ambulatory Visit: Payer: Self-pay | Admitting: General Surgery

## 2021-05-05 ENCOUNTER — Encounter: Payer: Medicare Other | Attending: General Surgery | Admitting: Skilled Nursing Facility1

## 2021-05-05 DIAGNOSIS — Z794 Long term (current) use of insulin: Secondary | ICD-10-CM | POA: Diagnosis not present

## 2021-05-05 DIAGNOSIS — E119 Type 2 diabetes mellitus without complications: Secondary | ICD-10-CM | POA: Insufficient documentation

## 2021-05-05 NOTE — Progress Notes (Signed)
Pre-Operative Nutrition Class:   ? ?Patient was seen on 05/05/2021 for Pre-Operative Bariatric Surgery Education at [facility].   ? ?Surgery date: 05/19/2021 ?Surgery type: Sleeve Gastrectomy  ?Start weight at [facility]: 324.7 lbs (date: 10/29/2020)  ?Weight today: 320.6 ? ?Samples given per MNT protocol. Patient educated on appropriate usage: ?Bariatric Advantage Multivitamin ?Lot # [id number] ?Exp:08/24 ?  ?Bariatric Advantage Calcium  ?Lot #[id number] ?Exp: 11/10/2021 ?  ?Protein Powder ?Lot # [id number] ?Exp: 02/24 ?  ?Protein Shake ?Lot # [id number] ?Exp: [id number] ? ?The following the learning objectives were met by the patient during this course: ?Identify Pre-Op Dietary Goals and will begin 2 weeks pre-operatively ?Identify appropriate sources of fluids and proteins  ?State protein recommendations and appropriate sources pre and post-operatively ?Identify Post-Operative Dietary Goals and will follow for 2 weeks post-operatively ?Identify appropriate multivitamin and calcium sources ?Describe the need for physical activity post-operatively and will follow MD recommendations ?State when to call healthcare provider regarding medication questions or post-operative complications ?When having a diagnosis of diabetes understanding hypoglycemia symptoms and the inclusion of 1 complex carbohydrate per meal ? ?Handouts given during class include: ?Pre-Op Bariatric Surgery Diet Handout ?Protein Shake Handout ?Post-Op Bariatric Surgery Nutrition Handout ?BELT Program Information Flyer ?Support Group Information Flyer ?[facility] Bariatric Supplements Price List ? ?Follow-Up Plan: ?Patient will follow-up at [facility] 2 weeks post operatively for diet advancement per MD.  ?

## 2021-05-06 DIAGNOSIS — D509 Iron deficiency anemia, unspecified: Secondary | ICD-10-CM | POA: Diagnosis not present

## 2021-05-06 DIAGNOSIS — E785 Hyperlipidemia, unspecified: Secondary | ICD-10-CM | POA: Diagnosis not present

## 2021-05-06 DIAGNOSIS — I10 Essential (primary) hypertension: Secondary | ICD-10-CM | POA: Diagnosis not present

## 2021-05-06 DIAGNOSIS — E1169 Type 2 diabetes mellitus with other specified complication: Secondary | ICD-10-CM | POA: Diagnosis not present

## 2021-05-07 ENCOUNTER — Other Ambulatory Visit (HOSPITAL_COMMUNITY): Payer: Self-pay

## 2021-05-07 DIAGNOSIS — E119 Type 2 diabetes mellitus without complications: Secondary | ICD-10-CM | POA: Diagnosis not present

## 2021-05-07 DIAGNOSIS — M1712 Unilateral primary osteoarthritis, left knee: Secondary | ICD-10-CM | POA: Diagnosis not present

## 2021-05-07 NOTE — Progress Notes (Addendum)
Anesthesia Review: ? ?PCP: [place]  ?Cardiologist : none  ?Chest x-ray : 02/11/21  ?EKG :02/10/21  ?Echo : ?Stress test: ?Cardiac Cath :  ?Activity level: can do a flight of stairs slowly per pt  ?Sleep Study/ CPAP : uses no cpap has not in at leatst 5 years per pt  ?Fasting Blood Sugar :      / Checks Blood Sugar -- times a day:   ?Blood Thinner/ Instructions /Last Dose: ?ASA / Instructions/ Last Dose :   ?81 mg Aspirin  ?DM- type 2 ?Checks glucose once daily at home  ?Hgba1c- 05/09/21- 6.1  ?CMP done 05/09/21 routed to Dr Kieth Brightly.   ?

## 2021-05-07 NOTE — Progress Notes (Signed)
DUE TO COVID-19 ONLY  2  VISITOR IS ALLOWED TO COME WITH YOU AND STAY IN THE WAITING ROOM ONLY DURING PRE OP AND PROCEDURE DAY OF SURGERY.   4 VISITOR  MAY VISIT WITH YOU AFTER SURGERY IN YOUR PRIVATE ROOM DURING VISITING HOURS ONLY! ?YOU MAY HAVE ONE PERSON SPEND THE NITE WITH YOU IN YOUR ROOM AFTER SURGERY.   ? ? Your procedure is scheduled on:  ?  05/19/2021  ? Report to [facility] Main  Entrance ? ? Report to admitting at        0515          AM ?DO NOT [place], PICTURE ID OR WALLET DAY OF SURGERY.  ?  ? ? Call this number if you have problems the morning of surgery [phone]  ? ? REMEMBER: NO  SOLID FOODS , CANDY, GUM OR MINTS AFTER MIDNITE THE NITE BEFORE SURGERY .       Marland Kitchen CLEAR LIQUIDS UNTIL      0430am           DAY OF SURGERY.      PLEASE FINISH ENSURE DRINK PER SURGEON ORDER  WHICH NEEDS TO BE COMPLETED AT     0430am       MORNING OF SURGERY.   ? ? ? ? ?CLEAR LIQUID DIET ? ? ?Foods Allowed      ?WATER ?BLACK COFFEE ( SUGAR OK, NO MILK, CREAM OR CREAMER) REGULAR AND DECAF  ?TEA ( SUGAR OK NO MILK, CREAM, OR CREAMER) REGULAR AND DECAF  ?PLAIN JELLO ( NO RED)  ?FRUIT ICES ( NO RED, NO FRUIT PULP)  ?POPSICLES ( NO RED)  ?JUICE- APPLE, WHITE GRAPE AND WHITE CRANBERRY  ?SPORT DRINK LIKE GATORADE ( NO RED)  ?CLEAR BROTH ( VEGETABLE , CHICKEN OR BEEF)                                                               ? ?    ? ?BRUSH YOUR TEETH MORNING OF SURGERY AND RINSE YOUR MOUTH OUT, NO CHEWING GUM CANDY OR MINTS. ?  ? ? Take these medicines the morning of surgery with A SIP OF WATER:  inhalers as usual and bring, protonix  ? ? ?DO NOT TAKE ANY DIABETIC MEDICATIONS DAY OF YOUR SURGERY ?                  ?            You may not have any metal on your body including hair pins and  ?            piercings  Do not wear jewelry, make-up, lotions, powders or perfumes, deodorant ?            Do not wear nail polish on your fingernails.   ?           IF YOU ARE A MALE AND WANT TO SHAVE UNDER ARMS  OR LEGS PRIOR TO SURGERY YOU MUST DO SO AT LEAST 48 HOURS PRIOR TO SURGERY.  ?            Men may shave face and neck. ? ? Do not bring valuables to the hospital. [facility] NOT ?  RESPONSIBLE   FOR VALUABLES. ? Contacts, dentures or bridgework may not be worn into surgery. ? Leave suitcase in the car. After surgery it may be brought to your room. ? ?  ? Patients discharged the day of surgery will not be allowed to drive home. IF YOU ARE HAVING SURGERY AND GOING HOME THE SAME DAY, YOU MUST HAVE AN ADULT TO DRIVE YOU HOME AND BE WITH YOU FOR 24 HOURS. YOU MAY GO HOME BY TAXI OR UBER OR ORTHERWISE, BUT AN ADULT MUST ACCOMPANY YOU HOME AND STAY WITH YOU FOR 24 HOURS. ?  ? ?            Please read over the following fact sheets you were given: ?_____________________________________________________________________ ? ?[place] - Preparing for Surgery ?Before surgery, you can play an important role.  Because skin is not sterile, your skin needs to be as free of germs as possible.  You can reduce the number of germs on your skin by washing with CHG (chlorahexidine gluconate) soap before surgery.  CHG is an antiseptic cleaner which kills germs and bonds with the skin to continue killing germs even after washing. ?Please DO NOT use if you have an allergy to CHG or antibacterial soaps.  If your skin becomes reddened/irritated stop using the CHG and inform your nurse when you arrive at Short Stay. ?Do not shave (including legs and underarms) for at least 48 hours prior to the first CHG shower.  You may shave your face/neck. ?Please follow these instructions carefully: ? 1.  Shower with CHG Soap the night before surgery and the  morning of Surgery. ? 2.  If you choose to wash your hair, wash your hair first as usual with your  normal  shampoo. ? 3.  After you shampoo, rinse your hair and body thoroughly to remove the  shampoo.                           4.  Use CHG as you would any other liquid soap.  You can  apply chg directly  to the skin and wash  ?                     Gently with a scrungie or clean washcloth. ? 5.  Apply the CHG Soap to your body ONLY FROM THE NECK DOWN.   Do not use on face/ open      ?                     Wound or open sores. Avoid contact with eyes, ears mouth and genitals (private parts).  ?                     Production manager,  Genitals (private parts) with your normal soap. ?            6.  Wash thoroughly, paying special attention to the area where your surgery  will be performed. ? 7.  Thoroughly rinse your body with warm water from the neck down. ? 8.  DO NOT shower/wash with your normal soap after using and rinsing off  the CHG Soap. ?               9.  Pat yourself dry with a clean towel. ?           10.  Wear clean pajamas. ?  11.  Place clean sheets on your bed the night of your first shower and do not  sleep with pets. ?Day of Surgery : ?Do not apply any lotions/deodorants the morning of surgery.  Please wear clean clothes to the hospital/surgery center. ? ?FAILURE TO FOLLOW THESE INSTRUCTIONS MAY RESULT IN THE CANCELLATION OF YOUR SURGERY ?PATIENT SIGNATURE_________________________________ ? ?NURSE SIGNATURE__________________________________ ? ?________________________________________________________________________  ? ? ?           ?

## 2021-05-08 IMAGING — CT CT CHEST HIGH RESOLUTION W/O CM
2 of 5 series · 15 of 36 positions shown, 18 images · non-contrast
Comparison: 09/06/2018, 08/20/2017.

CLINICAL DATA: Shortness of breath and productive cough since
ABEKQ-4O diagnosis in [REDACTED].

EXAM:
CT CHEST WITHOUT CONTRAST
TECHNIQUE: Multidetector CT imaging of the chest was performed following the
standard protocol without intravenous contrast. High resolution
imaging of the lungs, as well as inspiratory and expiratory imaging,
was performed.

[Series 2: high resolution · axial · 0.85mm/px · z∈[-402,-78]mm · 12 of 178 slices shown, 15 images]
[im 8/178  mediastinal]
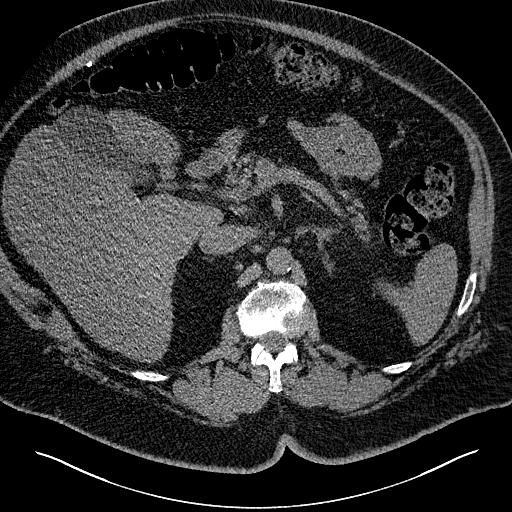
[im 8/178  lung]
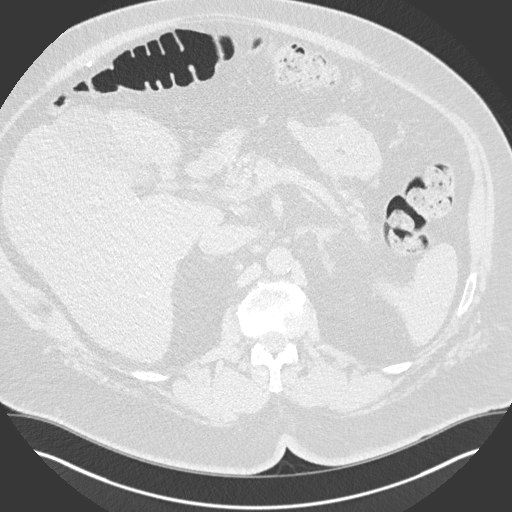
[im 24/178  lung]
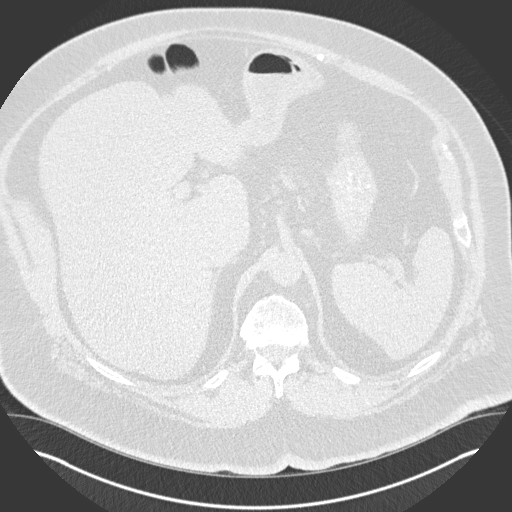
[im 39/178  lung]
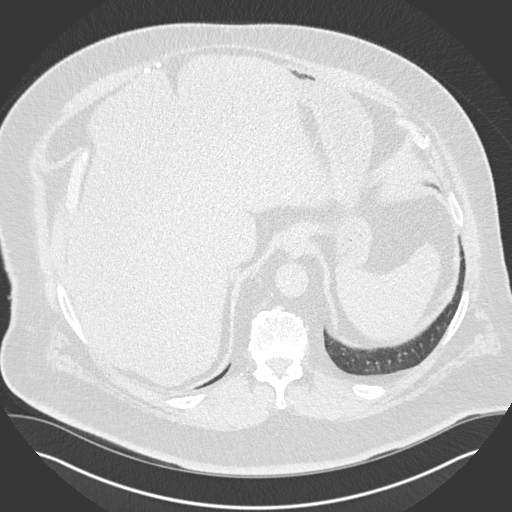
[im 54/178  lung]
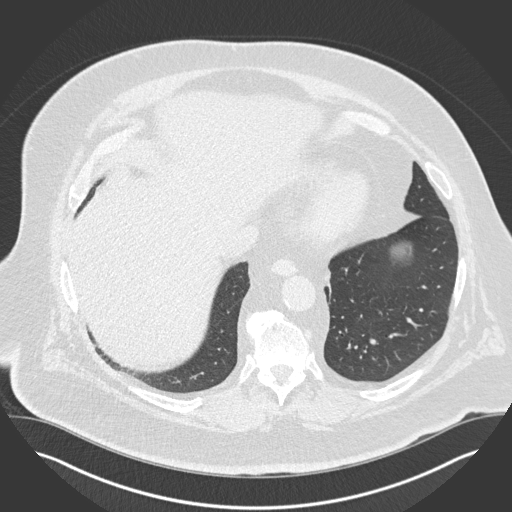
[im 70/178  mediastinal]
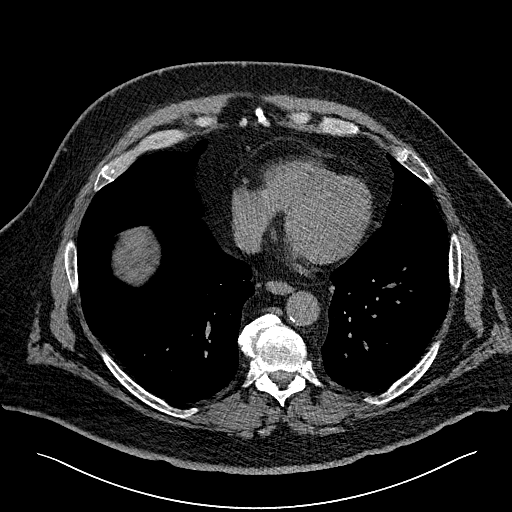
[im 70/178  lung]
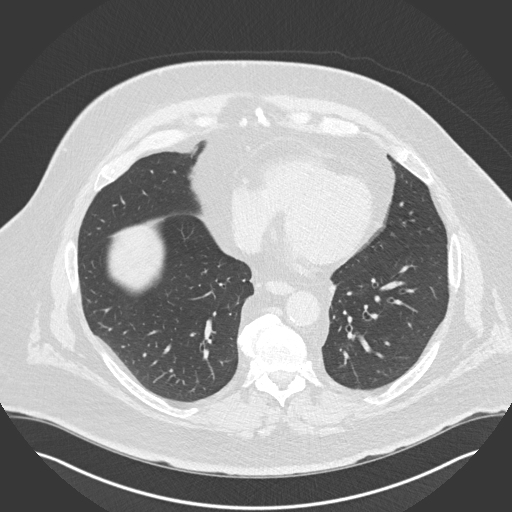
[im 85/178  lung]
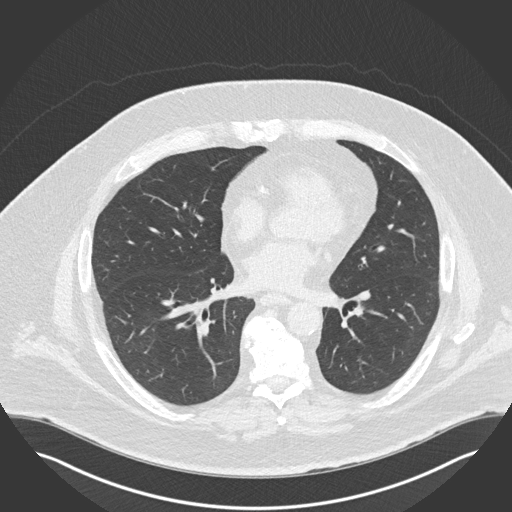
[im 93/178  lung]
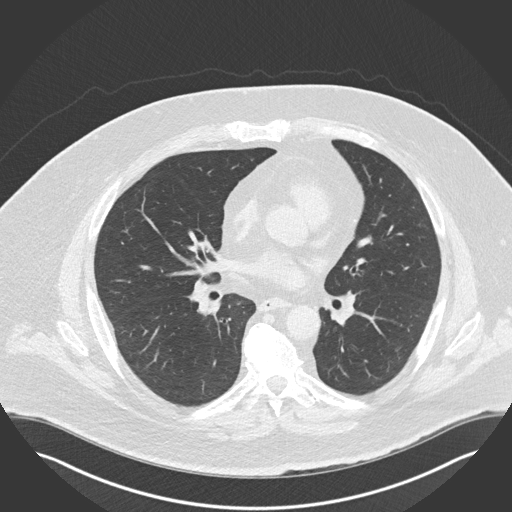
[im 108/178  lung]
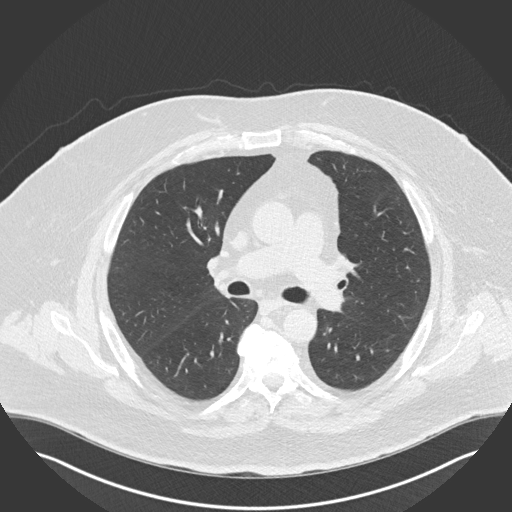
[im 124/178  mediastinal]
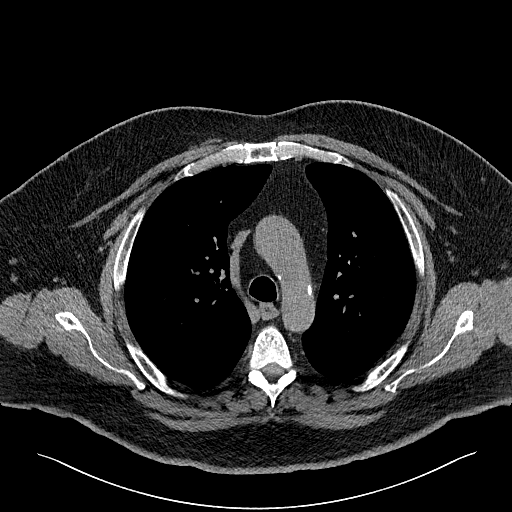
[im 124/178  lung]
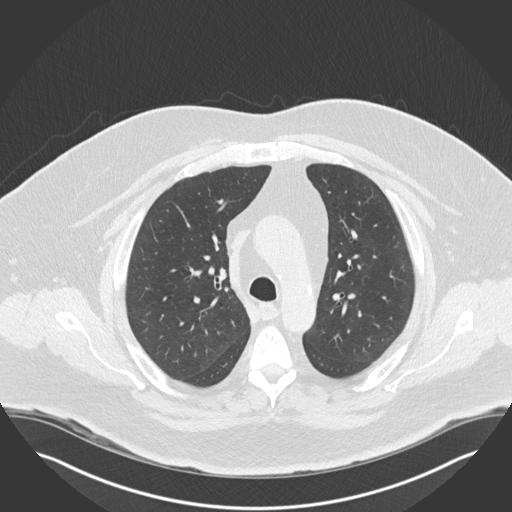
[im 139/178  lung]
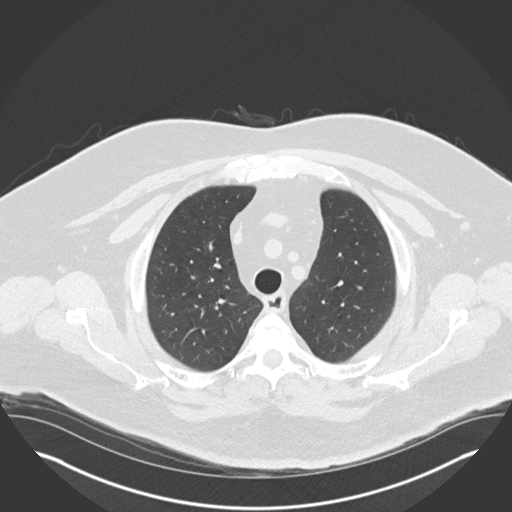
[im 154/178  lung]
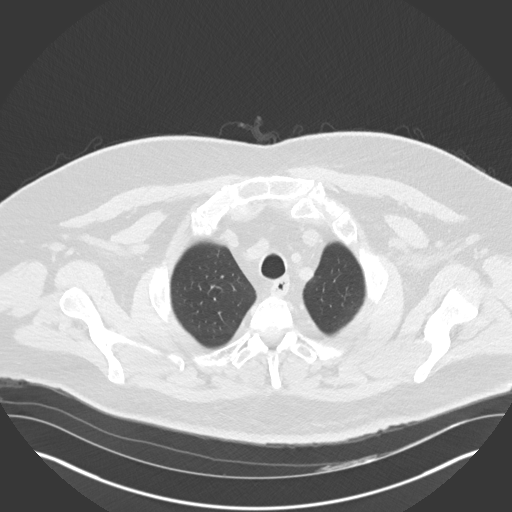
[im 170/178  lung]
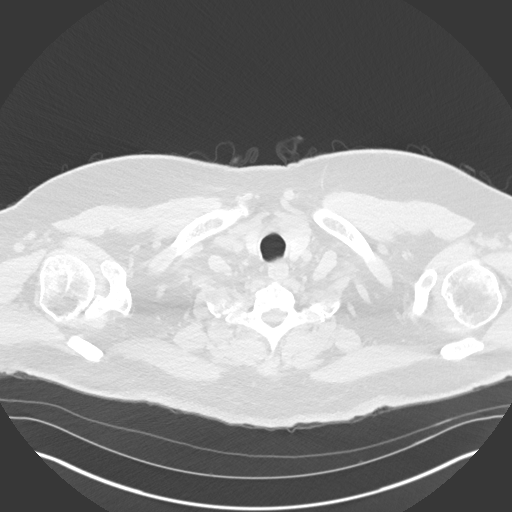

[Series 8: coronal · coronal · 0.71mm/px · 3 of 168 slices shown]
[im 34/168  lung]
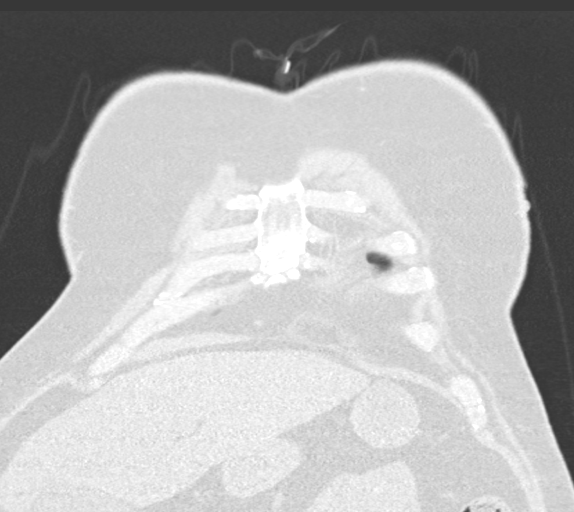
[im 67/168  lung]
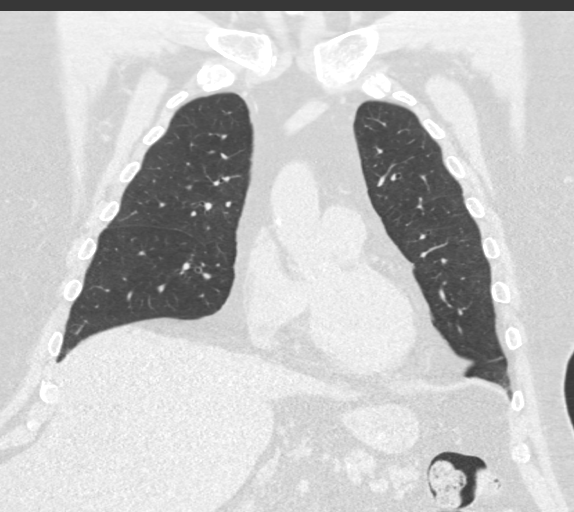
[im 101/168  lung]
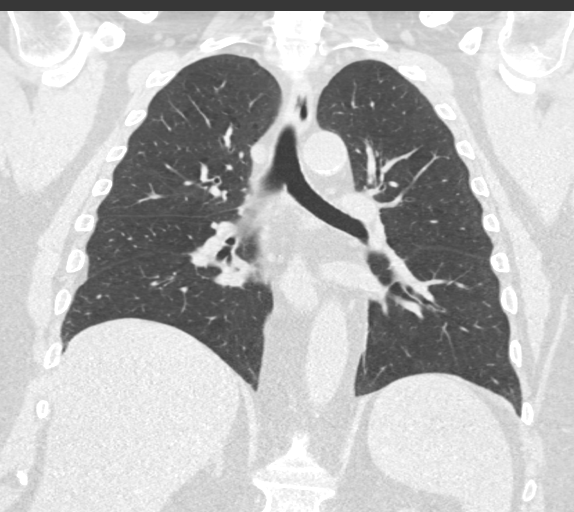

[15 of 36 positions shown; findings below may reference images not displayed]

FINDINGS: Cardiovascular: Atherosclerotic calcification of the aorta and
coronary arteries. Heart size normal. No pericardial effusion.

Mediastinum/Nodes: Mediastinal and axillary lymph nodes are not
enlarged by CT size criteria. Hilar regions are difficult to
definitively evaluate without IV contrast but appear grossly
unremarkable. Esophagus is grossly unremarkable.

Lungs/Pleura: Mild centrilobular emphysema. Very minimal basilar
subpleural reticular densities and ground-glass, similar to
08/20/2017. Very subtle areas of mild peripheral ground-glass in the
left upper and left lower lobes (3/63 and 75, respectively), related
to ABEKQ-4O pneumonia seen on 09/06/2018. No traction
bronchiectasis/bronchiolectasis or honeycombing. Subpleural lymph
node along the right major fissure (3/80), unchanged. No pleural
fluid. Airway is unremarkable. There is mild air trapping.

Upper Abdomen: Visualized portion of the liver is unremarkable. A
stone is partially imaged in the gallbladder. Visualized portions of
the adrenal glands, spleen, pancreas, stomach and bowel are grossly
unremarkable. Upper abdominal lymph nodes are not enlarged by CT
size criteria.

Musculoskeletal: Degenerative changes in the spine. No worrisome
lytic or sclerotic lesions.
IMPRESSION: 1. Near complete resolution of ABEKQ-4O pneumonia with subtle areas
of residual scarring in the left upper and left lower lobes, as
detailed above.
2. Very mild basilar subpleural reticulation and ground-glass, as on
08/20/2017. Findings may be due to nonspecific interstitial
pneumonitis. Findings are indeterminate for UIP per consensus
guidelines: Diagnosis of Idiopathic Pulmonary Fibrosis: An Official
ATS/ERS/JRS/ALAT Clinical Practice Guideline. Am J Respir Crit Care
Med Vol 198, Jim 5, [id number][DATE].
3. Mild air trapping is indicative of small airways disease.
4. Cholelithiasis.
5. Aortic atherosclerosis (B5BD8-170.0). Coronary artery
calcification.
6.  Emphysema ([id number].R).

## 2021-05-09 ENCOUNTER — Encounter (HOSPITAL_COMMUNITY): Payer: Self-pay

## 2021-05-09 ENCOUNTER — Encounter (HOSPITAL_COMMUNITY)
Admission: RE | Admit: 2021-05-09 | Discharge: 2021-05-09 | Disposition: A | Discharge status: Home / Self Care | Payer: Medicare Other | Source: Ambulatory Visit | Attending: General Surgery | Admitting: General Surgery

## 2021-05-09 ENCOUNTER — Other Ambulatory Visit: Payer: Self-pay

## 2021-05-09 VITALS — BP 129/74 | HR 88 | Temp 98.0°F | Resp 16 | Ht 70.5 in | Wt 310.0 lb

## 2021-05-09 DIAGNOSIS — E119 Type 2 diabetes mellitus without complications: Secondary | ICD-10-CM | POA: Diagnosis not present

## 2021-05-09 DIAGNOSIS — Z794 Long term (current) use of insulin: Secondary | ICD-10-CM | POA: Diagnosis not present

## 2021-05-09 DIAGNOSIS — E6609 Other obesity due to excess calories: Secondary | ICD-10-CM | POA: Insufficient documentation

## 2021-05-09 DIAGNOSIS — Z01812 Encounter for preprocedural laboratory examination: Secondary | ICD-10-CM | POA: Insufficient documentation

## 2021-05-09 HISTORY — DX: Dyspnea, unspecified: R06.00

## 2021-05-09 HISTORY — DX: Unspecified osteoarthritis, unspecified site: M19.90

## 2021-05-09 HISTORY — DX: Cardiac murmur, unspecified: R01.1

## 2021-05-09 LAB — CBC WITH DIFFERENTIAL/PLATELET
Abs Immature Granulocytes: 0.15 10*3/uL — ABNORMAL HIGH (ref 0.00–0.07)
Basophils Absolute: 0.2 10*3/uL — ABNORMAL HIGH (ref 0.0–0.1)
Basophils Relative: 2 %
Eosinophils Absolute: 0.5 10*3/uL (ref 0.0–0.5)
Eosinophils Relative: 4 %
HCT: 39.8 % (ref 39.0–52.0)
Hemoglobin: 12.7 g/dL — ABNORMAL LOW (ref 13.0–17.0)
Immature Granulocytes: 1 %
Lymphocytes Relative: 25 %
Lymphs Abs: 2.7 10*3/uL (ref 0.7–4.0)
MCH: 27 pg (ref 26.0–34.0)
MCHC: 31.9 g/dL (ref 30.0–36.0)
MCV: 84.5 fL (ref 80.0–100.0)
Monocytes Absolute: 1 10*3/uL (ref 0.1–1.0)
Monocytes Relative: 9 %
Neutro Abs: 6.5 10*3/uL (ref 1.7–7.7)
Neutrophils Relative %: 59 %
Platelets: 283 10*3/uL (ref 150–400)
RBC: 4.71 MIL/uL (ref 4.22–5.81)
RDW: 15 % (ref 11.5–15.5)
WBC: 10.9 10*3/uL — ABNORMAL HIGH (ref 4.0–10.5)
nRBC: 0 % (ref 0.0–0.2)

## 2021-05-09 LAB — COMPREHENSIVE METABOLIC PANEL
ALT: 50 U/L — ABNORMAL HIGH (ref 0–44)
AST: 63 U/L — ABNORMAL HIGH (ref 15–41)
Albumin: 4.2 g/dL (ref 3.5–5.0)
Alkaline Phosphatase: 30 U/L — ABNORMAL LOW (ref 38–126)
Anion gap: 12 (ref 5–15)
BUN: 22 mg/dL (ref 8–23)
CO2: 24 mmol/L (ref 22–32)
Calcium: 9.9 mg/dL (ref 8.9–10.3)
Chloride: 99 mmol/L (ref 98–111)
Creatinine, Ser: 1.24 mg/dL (ref 0.61–1.24)
GFR, Estimated: >60 mL/min (ref >60)
Glucose, Bld: 129 mg/dL — ABNORMAL HIGH (ref 70–99)
Potassium: 5.2 mmol/L — ABNORMAL HIGH (ref 3.5–5.1)
Sodium: 135 mmol/L (ref 135–145)
Total Bilirubin: 1.6 mg/dL — ABNORMAL HIGH (ref 0.3–1.2)
Total Protein: 7.8 g/dL (ref 6.5–8.1)

## 2021-05-09 LAB — HEMOGLOBIN A1C
Hgb A1c MFr Bld: 6.1 % — ABNORMAL HIGH (ref 4.8–5.6)
Mean Plasma Glucose: 128.37 mg/dL

## 2021-05-09 LAB — GLUCOSE, CAPILLARY: Glucose-Capillary: 121 mg/dL — ABNORMAL HIGH (ref 70–99)

## 2021-05-19 ENCOUNTER — Encounter (HOSPITAL_COMMUNITY)
Admission: RE | Disposition: A | Discharge status: Home / Self Care | Payer: Self-pay | Source: Ambulatory Visit | Attending: General Surgery

## 2021-05-19 ENCOUNTER — Inpatient Hospital Stay (HOSPITAL_COMMUNITY): Payer: Medicare Other | Admitting: Physician Assistant

## 2021-05-19 ENCOUNTER — Other Ambulatory Visit: Payer: Self-pay

## 2021-05-19 ENCOUNTER — Encounter (HOSPITAL_COMMUNITY): Payer: Self-pay | Admitting: General Surgery

## 2021-05-19 ENCOUNTER — Inpatient Hospital Stay (HOSPITAL_COMMUNITY)
Admission: RE | Admit: 2021-05-19 | Discharge: 2021-05-21 | DRG: 621 | Disposition: A | Discharge status: Home / Self Care | Payer: Medicare Other | Source: Ambulatory Visit | Attending: General Surgery | Admitting: General Surgery

## 2021-05-19 ENCOUNTER — Inpatient Hospital Stay (HOSPITAL_COMMUNITY): Payer: Medicare Other | Admitting: Certified Registered Nurse Anesthetist

## 2021-05-19 DIAGNOSIS — E785 Hyperlipidemia, unspecified: Secondary | ICD-10-CM | POA: Diagnosis not present

## 2021-05-19 DIAGNOSIS — Z79899 Other long term (current) drug therapy: Secondary | ICD-10-CM

## 2021-05-19 DIAGNOSIS — Z6841 Body Mass Index (BMI) 40.0 and over, adult: Secondary | ICD-10-CM | POA: Diagnosis not present

## 2021-05-19 DIAGNOSIS — E119 Type 2 diabetes mellitus without complications: Secondary | ICD-10-CM | POA: Diagnosis present

## 2021-05-19 DIAGNOSIS — I10 Essential (primary) hypertension: Secondary | ICD-10-CM | POA: Diagnosis not present

## 2021-05-19 DIAGNOSIS — Z87891 Personal history of nicotine dependence: Secondary | ICD-10-CM | POA: Diagnosis not present

## 2021-05-19 DIAGNOSIS — Z7984 Long term (current) use of oral hypoglycemic drugs: Secondary | ICD-10-CM

## 2021-05-19 DIAGNOSIS — M1712 Unilateral primary osteoarthritis, left knee: Secondary | ICD-10-CM | POA: Diagnosis not present

## 2021-05-19 DIAGNOSIS — Z794 Long term (current) use of insulin: Principal | ICD-10-CM

## 2021-05-19 HISTORY — PX: UPPER GI ENDOSCOPY: SHX6162

## 2021-05-19 LAB — CBC
HCT: 36.3 % — ABNORMAL LOW (ref 39.0–52.0)
Hemoglobin: 11.4 g/dL — ABNORMAL LOW (ref 13.0–17.0)
MCH: 27 pg (ref 26.0–34.0)
MCHC: 31.4 g/dL (ref 30.0–36.0)
MCV: 85.8 fL (ref 80.0–100.0)
Platelets: 217 10*3/uL (ref 150–400)
RBC: 4.23 MIL/uL (ref 4.22–5.81)
RDW: 15 % (ref 11.5–15.5)
WBC: 9.6 10*3/uL (ref 4.0–10.5)
nRBC: 0 % (ref 0.0–0.2)

## 2021-05-19 LAB — CREATININE, SERUM
Creatinine, Ser: 1.25 mg/dL — ABNORMAL HIGH (ref 0.61–1.24)
GFR, Estimated: >60 mL/min (ref >60)

## 2021-05-19 LAB — HEMOGLOBIN AND HEMATOCRIT, BLOOD
HCT: 33.5 % — ABNORMAL LOW (ref 39.0–52.0)
Hemoglobin: 10.6 g/dL — ABNORMAL LOW (ref 13.0–17.0)

## 2021-05-19 LAB — GLUCOSE, CAPILLARY
Glucose-Capillary: 138 mg/dL — ABNORMAL HIGH (ref 70–99)
Glucose-Capillary: 185 mg/dL — ABNORMAL HIGH (ref 70–99)

## 2021-05-19 LAB — TYPE AND SCREEN
ABO/RH(D): A POS
Antibody Screen: NEGATIVE

## 2021-05-19 LAB — ABO/RH: ABO/RH(D): A POS

## 2021-05-19 SURGERY — XI ROBOTIC GASTRIC SLEEVE RESECTION
Anesthesia: General

## 2021-05-19 MED ORDER — DEXAMETHASONE SODIUM PHOSPHATE 10 MG/ML IJ SOLN
INTRAMUSCULAR | Status: AC
  Filled 2021-05-19: qty 1

## 2021-05-19 MED ORDER — MIDAZOLAM HCL 5 MG/5ML IJ SOLN
INTRAMUSCULAR | Status: DC | PRN
  Administered 2021-05-19: 2 mg via INTRAVENOUS

## 2021-05-19 MED ORDER — ALBUTEROL SULFATE (2.5 MG/3ML) 0.083% IN NEBU
2.5000 mg | INHALATION_SOLUTION | RESPIRATORY_TRACT | Status: DC | PRN
Start: 2021-05-19 — End: 2021-05-21

## 2021-05-19 MED ORDER — TELMISARTAN-HCTZ 40-12.5 MG PO TABS: 1.0000 | ORAL_TABLET | Freq: Every day | ORAL | Status: DC

## 2021-05-19 MED ORDER — CHLORHEXIDINE GLUCONATE CLOTH 2 % EX PADS: 6.0000 | MEDICATED_PAD | Freq: Once | CUTANEOUS | Status: DC

## 2021-05-19 MED ORDER — IRBESARTAN 150 MG PO TABS
150.0000 mg | ORAL_TABLET | Freq: Every day | ORAL | Status: DC
  Administered 2021-05-20 – 2021-05-21 (×2): 150 mg via ORAL
  Filled 2021-05-19 (×2): qty 1

## 2021-05-19 MED ORDER — LACTATED RINGERS IV SOLN: INTRAVENOUS | Status: DC

## 2021-05-19 MED ORDER — FENTANYL CITRATE PF 50 MCG/ML IJ SOSY
PREFILLED_SYRINGE | INTRAMUSCULAR | Status: AC
  Administered 2021-05-19: 50 ug via INTRAVENOUS
  Filled 2021-05-19: qty 2

## 2021-05-19 MED ORDER — EPHEDRINE SULFATE-NACL 50-0.9 MG/10ML-% IV SOSY
PREFILLED_SYRINGE | INTRAVENOUS | Status: DC | PRN
  Administered 2021-05-19 (×2): 5 mg via INTRAVENOUS

## 2021-05-19 MED ORDER — SUGAMMADEX SODIUM 500 MG/5ML IV SOLN
INTRAVENOUS | Status: AC
  Filled 2021-05-19: qty 5

## 2021-05-19 MED ORDER — ENOXAPARIN (LOVENOX) PATIENT EDUCATION KIT
PACK | Freq: Once | Status: AC
  Filled 2021-05-19: qty 1

## 2021-05-19 MED ORDER — ENSURE MAX PROTEIN PO LIQD
2.0000 [oz_av] | ORAL | Status: DC
  Administered 2021-05-20 – 2021-05-21 (×12): 2 [oz_av] via ORAL

## 2021-05-19 MED ORDER — DEXAMETHASONE SODIUM PHOSPHATE 10 MG/ML IJ SOLN
INTRAMUSCULAR | Status: DC | PRN
Start: 2021-05-19 — End: 2021-05-19
  Administered 2021-05-19: 7 mg via INTRAVENOUS

## 2021-05-19 MED ORDER — ALBUTEROL SULFATE HFA 108 (90 BASE) MCG/ACT IN AERS: 2.0000 | INHALATION_SPRAY | RESPIRATORY_TRACT | Status: DC | PRN

## 2021-05-19 MED ORDER — FENTANYL CITRATE (PF) 250 MCG/5ML IJ SOLN
INTRAMUSCULAR | Status: AC
  Filled 2021-05-19: qty 5

## 2021-05-19 MED ORDER — ROCURONIUM BROMIDE 10 MG/ML (PF) SYRINGE
PREFILLED_SYRINGE | INTRAVENOUS | Status: AC
  Filled 2021-05-19: qty 10

## 2021-05-19 MED ORDER — OXYCODONE HCL 5 MG/5ML PO SOLN
ORAL | Status: AC
  Filled 2021-05-19: qty 5

## 2021-05-19 MED ORDER — ONDANSETRON HCL 4 MG/2ML IJ SOLN
INTRAMUSCULAR | Status: AC
  Filled 2021-05-19: qty 2

## 2021-05-19 MED ORDER — SIMETHICONE 80 MG PO CHEW: 80.0000 mg | CHEWABLE_TABLET | Freq: Four times a day (QID) | ORAL | Status: DC | PRN

## 2021-05-19 MED ORDER — DEXTROSE-NACL 5-0.45 % IV SOLN: INTRAVENOUS | Status: DC

## 2021-05-19 MED ORDER — ACETAMINOPHEN 160 MG/5ML PO SOLN: 1000.0000 mg | Freq: Three times a day (TID) | ORAL | Status: DC

## 2021-05-19 MED ORDER — KETAMINE HCL 50 MG/5ML IJ SOSY
PREFILLED_SYRINGE | INTRAMUSCULAR | Status: AC
  Filled 2021-05-19: qty 5

## 2021-05-19 MED ORDER — BUPIVACAINE-EPINEPHRINE 0.25% -1:200000 IJ SOLN
INTRAMUSCULAR | Status: DC | PRN
  Administered 2021-05-19: 30 mL

## 2021-05-19 MED ORDER — PHENYLEPHRINE HCL-NACL 20-0.9 MG/250ML-% IV SOLN
INTRAVENOUS | Status: DC | PRN
Start: 2021-05-19 — End: 2021-05-19
  Administered 2021-05-19: 50 ug/min via INTRAVENOUS

## 2021-05-19 MED ORDER — BUPIVACAINE-EPINEPHRINE (PF) 0.25% -1:200000 IJ SOLN
INTRAMUSCULAR | Status: AC
  Filled 2021-05-19: qty 30

## 2021-05-19 MED ORDER — EPHEDRINE 5 MG/ML INJ
INTRAVENOUS | Status: AC
  Filled 2021-05-19: qty 5

## 2021-05-19 MED ORDER — ORAL CARE MOUTH RINSE: 15.0000 mL | Freq: Once | OROMUCOSAL | Status: AC

## 2021-05-19 MED ORDER — LIDOCAINE HCL (PF) 2 % IJ SOLN
INTRAMUSCULAR | Status: AC
  Filled 2021-05-19: qty 5

## 2021-05-19 MED ORDER — MIDAZOLAM HCL 2 MG/2ML IJ SOLN
INTRAMUSCULAR | Status: AC
  Filled 2021-05-19: qty 2

## 2021-05-19 MED ORDER — CHLORHEXIDINE GLUCONATE 0.12 % MT SOLN
15.0000 mL | Freq: Once | OROMUCOSAL | Status: AC
  Administered 2021-05-19: 15 mL via OROMUCOSAL

## 2021-05-19 MED ORDER — ACETAMINOPHEN 500 MG PO TABS
1000.0000 mg | ORAL_TABLET | Freq: Three times a day (TID) | ORAL | Status: DC
  Administered 2021-05-19 – 2021-05-20 (×4): 1000 mg via ORAL
  Filled 2021-05-19 (×4): qty 2

## 2021-05-19 MED ORDER — PROPOFOL 10 MG/ML IV BOLUS
INTRAVENOUS | Status: DC | PRN
  Administered 2021-05-19: 180 mg via INTRAVENOUS

## 2021-05-19 MED ORDER — HYDRALAZINE HCL 20 MG/ML IJ SOLN: 10.0000 mg | INTRAMUSCULAR | Status: DC | PRN

## 2021-05-19 MED ORDER — LIDOCAINE HCL 2 % IJ SOLN
INTRAMUSCULAR | Status: AC
  Filled 2021-05-19: qty 20

## 2021-05-19 MED ORDER — SUGAMMADEX SODIUM 200 MG/2ML IV SOLN
INTRAVENOUS | Status: DC | PRN
  Administered 2021-05-19: 400 mg via INTRAVENOUS

## 2021-05-19 MED ORDER — SUCCINYLCHOLINE CHLORIDE 200 MG/10ML IV SOSY
PREFILLED_SYRINGE | INTRAVENOUS | Status: DC | PRN
Start: 2021-05-19 — End: 2021-05-19
  Administered 2021-05-19: 120 mg via INTRAVENOUS

## 2021-05-19 MED ORDER — MORPHINE SULFATE (PF) 2 MG/ML IV SOLN: 1.0000 mg | INTRAVENOUS | Status: DC | PRN

## 2021-05-19 MED ORDER — APREPITANT 40 MG PO CAPS
40.0000 mg | ORAL_CAPSULE | ORAL | Status: AC
  Administered 2021-05-19: 40 mg via ORAL
  Filled 2021-05-19: qty 1

## 2021-05-19 MED ORDER — OXYCODONE HCL 5 MG/5ML PO SOLN
5.0000 mg | Freq: Four times a day (QID) | ORAL | Status: DC | PRN
  Administered 2021-05-19: 5 mg via ORAL

## 2021-05-19 MED ORDER — SODIUM CHLORIDE 0.9 % IV SOLN
2.0000 g | INTRAVENOUS | Status: AC
  Administered 2021-05-19: 2 g via INTRAVENOUS
  Filled 2021-05-19: qty 2

## 2021-05-19 MED ORDER — HEPARIN SODIUM (PORCINE) 5000 UNIT/ML IJ SOLN
5000.0000 [IU] | INTRAMUSCULAR | Status: AC
  Administered 2021-05-19: 5000 [IU] via SUBCUTANEOUS
  Filled 2021-05-19: qty 1

## 2021-05-19 MED ORDER — PROPOFOL 10 MG/ML IV BOLUS
INTRAVENOUS | Status: AC
  Filled 2021-05-19: qty 20

## 2021-05-19 MED ORDER — AMISULPRIDE (ANTIEMETIC) 5 MG/2ML IV SOLN: 10.0000 mg | Freq: Once | INTRAVENOUS | Status: DC | PRN

## 2021-05-19 MED ORDER — FENTANYL CITRATE PF 50 MCG/ML IJ SOSY: 25.0000 ug | PREFILLED_SYRINGE | INTRAMUSCULAR | Status: DC | PRN

## 2021-05-19 MED ORDER — ROCURONIUM BROMIDE 10 MG/ML (PF) SYRINGE
PREFILLED_SYRINGE | INTRAVENOUS | Status: DC | PRN
Start: 2021-05-19 — End: 2021-05-19
  Administered 2021-05-19: 80 mg via INTRAVENOUS
  Administered 2021-05-19: 20 mg via INTRAVENOUS

## 2021-05-19 MED ORDER — KETAMINE HCL 10 MG/ML IJ SOLN
INTRAMUSCULAR | Status: DC | PRN
  Administered 2021-05-19: 50 mg via INTRAVENOUS

## 2021-05-19 MED ORDER — ONDANSETRON HCL 4 MG/2ML IJ SOLN
INTRAMUSCULAR | Status: DC | PRN
  Administered 2021-05-19: 4 mg via INTRAVENOUS

## 2021-05-19 MED ORDER — LIDOCAINE 2% (20 MG/ML) 5 ML SYRINGE
INTRAMUSCULAR | Status: DC | PRN
  Administered 2021-05-19: 80 mg via INTRAVENOUS

## 2021-05-19 MED ORDER — 0.9 % SODIUM CHLORIDE (POUR BTL) OPTIME
TOPICAL | Status: DC | PRN
  Administered 2021-05-19: 1000 mL

## 2021-05-19 MED ORDER — LACTATED RINGERS IR SOLN
Status: DC | PRN
  Administered 2021-05-19: 1000 mL

## 2021-05-19 MED ORDER — BUPIVACAINE LIPOSOME 1.3 % IJ SUSP: 20.0000 mL | Freq: Once | INTRAMUSCULAR | Status: DC

## 2021-05-19 MED ORDER — BUPIVACAINE LIPOSOME 1.3 % IJ SUSP
INTRAMUSCULAR | Status: DC | PRN
  Administered 2021-05-19: 20 mL

## 2021-05-19 MED ORDER — LIDOCAINE 2% (20 MG/ML) 5 ML SYRINGE
INTRAMUSCULAR | Status: DC | PRN
  Administered 2021-05-19: 1.5 mg/kg/h via INTRAVENOUS

## 2021-05-19 MED ORDER — BUPIVACAINE LIPOSOME 1.3 % IJ SUSP
INTRAMUSCULAR | Status: AC
  Filled 2021-05-19: qty 20

## 2021-05-19 MED ORDER — SCOPOLAMINE 1 MG/3DAYS TD PT72
1.0000 | MEDICATED_PATCH | TRANSDERMAL | Status: DC
  Administered 2021-05-19: 1.5 mg via TRANSDERMAL
  Filled 2021-05-19: qty 1

## 2021-05-19 MED ORDER — FENTANYL CITRATE (PF) 250 MCG/5ML IJ SOLN
INTRAMUSCULAR | Status: DC | PRN
  Administered 2021-05-19 (×3): 50 ug via INTRAVENOUS
  Administered 2021-05-19: 100 ug via INTRAVENOUS

## 2021-05-19 MED ORDER — ONDANSETRON HCL 4 MG/2ML IJ SOLN: 4.0000 mg | INTRAMUSCULAR | Status: DC | PRN

## 2021-05-19 MED ORDER — FAMOTIDINE IN NACL 20-0.9 MG/50ML-% IV SOLN
20.0000 mg | Freq: Two times a day (BID) | INTRAVENOUS | Status: DC
  Administered 2021-05-19 – 2021-05-20 (×4): 20 mg via INTRAVENOUS
  Filled 2021-05-19 (×4): qty 50

## 2021-05-19 MED ORDER — ENOXAPARIN SODIUM 30 MG/0.3ML IJ SOSY
30.0000 mg | PREFILLED_SYRINGE | Freq: Two times a day (BID) | INTRAMUSCULAR | Status: DC
  Administered 2021-05-19: 30 mg via SUBCUTANEOUS
  Filled 2021-05-19: qty 0.3

## 2021-05-19 MED ORDER — HYDROCHLOROTHIAZIDE 12.5 MG PO TABS
12.5000 mg | ORAL_TABLET | Freq: Every day | ORAL | Status: DC
  Administered 2021-05-20 – 2021-05-21 (×2): 12.5 mg via ORAL
  Filled 2021-05-19 (×2): qty 1

## 2021-05-19 MED ORDER — ACETAMINOPHEN 500 MG PO TABS
1000.0000 mg | ORAL_TABLET | ORAL | Status: AC
  Administered 2021-05-19: 1000 mg via ORAL
  Filled 2021-05-19: qty 2

## 2021-05-19 MED ORDER — DEXAMETHASONE SODIUM PHOSPHATE 4 MG/ML IJ SOLN: 4.0000 mg | INTRAMUSCULAR | Status: DC

## 2021-05-19 SURGICAL SUPPLY — 89 items
ADH SKN CLS APL DERMABOND .7 (GAUZE/BANDAGES/DRESSINGS)
APL PRP STRL LF DISP 70% ISPRP (MISCELLANEOUS) ×1
APL SKNCLS STERI-STRIP NONHPOA (GAUZE/BANDAGES/DRESSINGS) ×1
APPLIER CLIP 5 13 M/L LIGAMAX5 (MISCELLANEOUS)
APPLIER CLIP ROT 10 11.4 M/L (STAPLE)
APR CLP MED LRG 11.4X10 (STAPLE)
APR CLP MED LRG 5 ANG JAW (MISCELLANEOUS)
BAG LAPAROSCOPIC 12 15 PORT 16 (BASKET) IMPLANT
BAG RETRIEVAL 12/15 (BASKET) ×2
BENZOIN TINCTURE PRP APPL 2/3 (GAUZE/BANDAGES/DRESSINGS) ×2 IMPLANT
BLADE SURG SZ11 CARB STEEL (BLADE) ×2 IMPLANT
BNDG ADH 1X3 SHEER STRL LF (GAUZE/BANDAGES/DRESSINGS) ×12 IMPLANT
BNDG ADH THN 3X1 STRL LF (GAUZE/BANDAGES/DRESSINGS) ×6
CANNULA REDUC XI 12-8 STAPL (CANNULA) ×2
CANNULA REDUCER 12-8 DVNC XI (CANNULA) ×1 IMPLANT
CHLORAPREP W/TINT 26 (MISCELLANEOUS) ×2 IMPLANT
CLIP APPLIE 5 13 M/L LIGAMAX5 (MISCELLANEOUS) IMPLANT
CLIP APPLIE ROT 10 11.4 M/L (STAPLE) IMPLANT
CLSR STERI-STRIP ANTIMIC 1/2X4 (GAUZE/BANDAGES/DRESSINGS) ×2 IMPLANT
COVER SURGICAL LIGHT HANDLE (MISCELLANEOUS) ×2 IMPLANT
DERMABOND ADVANCED (GAUZE/BANDAGES/DRESSINGS)
DERMABOND ADVANCED .7 DNX12 (GAUZE/BANDAGES/DRESSINGS) IMPLANT
DRAPE ARM DVNC X/XI (DISPOSABLE) ×4 IMPLANT
DRAPE COLUMN DVNC XI (DISPOSABLE) ×1 IMPLANT
DRAPE CV SPLIT W-CLR ANES SCRN (DRAPES) ×2 IMPLANT
DRAPE DA VINCI XI ARM (DISPOSABLE) ×8
DRAPE DA VINCI XI COLUMN (DISPOSABLE) ×2
ELECT REM PT RETURN 15FT ADLT (MISCELLANEOUS) ×2 IMPLANT
ENDOLOOP SUT PDS II  0 18 (SUTURE)
ENDOLOOP SUT PDS II 0 18 (SUTURE) IMPLANT
GAUZE 4X4 16PLY ~~LOC~~+RFID DBL (SPONGE) ×2 IMPLANT
GLOVE BIOGEL PI IND STRL 7.0 (GLOVE) ×2 IMPLANT
GLOVE BIOGEL PI INDICATOR 7.0 (GLOVE) ×2
GLOVE SURG SS PI 7.0 STRL IVOR (GLOVE) ×4 IMPLANT
GOWN STRL REUS W/ TWL LRG LVL3 (GOWN DISPOSABLE) ×2 IMPLANT
GOWN STRL REUS W/ TWL XL LVL3 (GOWN DISPOSABLE) IMPLANT
GOWN STRL REUS W/TWL LRG LVL3 (GOWN DISPOSABLE) ×4
GOWN STRL REUS W/TWL XL LVL3 (GOWN DISPOSABLE)
GRASPER SUT TROCAR 14GX15 (MISCELLANEOUS) ×2 IMPLANT
IRRIG SUCT STRYKERFLOW 2 WTIP (MISCELLANEOUS) ×2
IRRIGATION SUCT STRKRFLW 2 WTP (MISCELLANEOUS) ×1 IMPLANT
KIT BASIN OR (CUSTOM PROCEDURE TRAY) ×2 IMPLANT
KIT TURNOVER KIT A (KITS) IMPLANT
MARKER SKIN DUAL TIP RULER LAB (MISCELLANEOUS) ×2 IMPLANT
MAT PREVALON FULL STRYKER (MISCELLANEOUS) ×2 IMPLANT
NDL INSUFFLATION 14GA 120MM (NEEDLE) ×1 IMPLANT
NDL SPNL 22GX3.5 QUINCKE BK (NEEDLE) ×1 IMPLANT
NEEDLE INSUFFLATION 14GA 120MM (NEEDLE) ×2 IMPLANT
NEEDLE SPNL 22GX3.5 QUINCKE BK (NEEDLE) ×2 IMPLANT
RELOAD STAPLE 60 2.5 WHT DVNC (STAPLE) ×1 IMPLANT
RELOAD STAPLE 60 3.5 BLU DVNC (STAPLE) ×1 IMPLANT
RELOAD STAPLE 60 4.3 GRN DVNC (STAPLE) IMPLANT
RELOAD STAPLER 2.5X60 WHT DVNC (STAPLE) ×6 IMPLANT
RELOAD STAPLER 3.5X60 BLU DVNC (STAPLE) ×2 IMPLANT
RELOAD STAPLER 4.3X60 GRN DVNC (STAPLE) IMPLANT
SCISSORS LAP 5X35 DISP (ENDOMECHANICALS) IMPLANT
SEAL CANN UNIV 5-8 DVNC XI (MISCELLANEOUS) ×3 IMPLANT
SEAL XI 5MM-8MM UNIVERSAL (MISCELLANEOUS) ×6
SEALER VESSEL DA VINCI XI (MISCELLANEOUS) ×2
SEALER VESSEL EXT DVNC XI (MISCELLANEOUS) ×1 IMPLANT
SLEEVE GASTRECTOMY 40FR VISIGI (MISCELLANEOUS) ×2 IMPLANT
SOL ANTI FOG 6CC (MISCELLANEOUS) ×1 IMPLANT
SOLUTION ANTI FOG 6CC (MISCELLANEOUS) ×1
SOLUTION ELECTROLUBE (MISCELLANEOUS) ×2 IMPLANT
SPIKE FLUID TRANSFER (MISCELLANEOUS) ×2 IMPLANT
STAPLER 60 DA VINCI SURE FORM (STAPLE) ×2
STAPLER 60 SUREFORM DVNC (STAPLE) ×1 IMPLANT
STAPLER CANNULA SEAL DVNC XI (STAPLE) ×1 IMPLANT
STAPLER CANNULA SEAL XI (STAPLE) ×2
STAPLER RELOAD 2.5X60 WHITE (STAPLE) ×12
STAPLER RELOAD 2.5X60 WHT DVNC (STAPLE) ×6
STAPLER RELOAD 3.5X60 BLU DVNC (STAPLE) ×2
STAPLER RELOAD 3.5X60 BLUE (STAPLE) ×4
STAPLER RELOAD 4.3X60 GREEN (STAPLE)
STAPLER RELOAD 4.3X60 GRN DVNC (STAPLE)
SUT ETHIBOND 0 36 GRN (SUTURE) IMPLANT
SUT MNCRL AB 4-0 PS2 18 (SUTURE) ×3 IMPLANT
SUT VIC AB 2-0 SH 27 (SUTURE)
SUT VIC AB 2-0 SH 27X BRD (SUTURE) IMPLANT
SUT VICRYL 0 TIES 12 18 (SUTURE) ×2 IMPLANT
SUT VLOC 180 2-0 9IN GS21 (SUTURE) IMPLANT
SYR 20ML LL LF (SYRINGE) ×2 IMPLANT
TOWEL OR 17X26 10 PK STRL BLUE (TOWEL DISPOSABLE) ×2 IMPLANT
TOWEL OR NON WOVEN STRL DISP B (DISPOSABLE) ×2 IMPLANT
TRAY FOLEY MTR SLVR 16FR STAT (SET/KITS/TRAYS/PACK) IMPLANT
TROCAR ADV FIXATION 5X100MM (TROCAR) IMPLANT
TROCAR BLADELESS OPT 5 100 (ENDOMECHANICALS) ×2 IMPLANT
TROCAR Z-THREAD OPTICAL 5X100M (TROCAR) ×1 IMPLANT
TUBING INSUFFLATION 10FT LAP (TUBING) ×2 IMPLANT

## 2021-05-19 NOTE — H&P (Signed)
Chief Complaint: Weight Loss ? ? ?History of Present Illness: ?[name] [name] is a 71 y.o. male who is seen today for bariatric preop discussion. ? ?He has no new medical issues or medications ? ?Review of Systems: ?A complete review of systems was obtained from the patient. I have reviewed this information and discussed as appropriate with the patient. See HPI as well for other ROS. ? ?Review of Systems  ?Constitutional: Negative.  ?HENT: Negative.  ?Eyes: Negative.  ?Respiratory: Negative.  ?Cardiovascular: Negative.  ?Gastrointestinal: Negative.  ?Genitourinary: Negative.  ?Musculoskeletal: Negative.  ?Skin: Negative.  ?Neurological: Negative.  ?Endo/Heme/Allergies: Negative.  ?Psychiatric/Behavioral: Negative.  ? ? ?Medical History: ?History reviewed. No pertinent past medical history. ? ?There is no problem list on file for this patient. ? ?History reviewed. No pertinent surgical history.  ? ?Allergies  ?Allergen Reactions  ? Mercury (Bulk) Swelling  ? ?Current Outpatient Medications on File Prior to Visit  ?Medication Sig Dispense Refill  ? albuterol 90 mcg/actuation inhaler TAKE 2 PUFFS BY MOUTH EVERY 6 HOURS AS NEEDED  ? atorvastatin (LIPITOR) 20 MG tablet Take 20 mg by mouth once daily  ? calcium carbonate-vitamin D3 500 mg-10 mcg (400 unit) Chew Take by mouth  ? cholecalciferol, vitamin D3, (CHOLECALCIFEROL, VIT D3,,BULK,) 100,000 unit/gram Powd Take by mouth  ? diclofenac (VOLTAREN) 1 % topical gel APPLY TO LEFT KNEE UP TO FOUR TIMES DAILY FOR PAIN  ? docosahexaenoic acid-epa 120-180 mg Cap Take 1 capsule by mouth once daily  ? fluticasone furoate-vilanteroL (BREO ELLIPTA) 200-25 mcg/dose DsDv Inhale 1 inhalation into the lungs once daily  ? glipiZIDE (GLUCOTROL XL) 10 MG XL tablet TAKE 1 TABLET BY MOUTH DAILY FOR DIABETES  ? ibuprofen (MOTRIN) 800 MG tablet Take by mouth  ? metFORMIN (GLUCOPHAGE-XR) 500 MG XR tablet TAKE 2 TABLETS BY MOUTH TWICE DAILY FOR DIABETES  ? montelukast (SINGULAIR) 10 mg  tablet Take 10 mg by mouth at bedtime  ? multivitamin tablet Take 1 tablet by mouth once daily  ? ONETOUCH VERIO TEST STRIPS test strip USE 1 STRIP DAILY FOR TESTING  ? pantoprazole (PROTONIX) 20 MG DR tablet Take 20 mg by mouth once daily  ? SITagliptin (JANUVIA) 100 MG tablet Take 100 mg by mouth once daily  ? telmisartan-hydrochlorothiazide (MICARDIS HCT) 40-12.5 mg tablet Take 1 tablet by mouth once daily  ? ?No current facility-administered medications on file prior to visit.  ? ?History reviewed. No pertinent family history.  ? ?Social History  ? ?Tobacco Use  ?Smoking Status Former  ? Types: Cigarettes  ? Quit date: 2003  ? Years since quitting: 20.3  ?Smokeless Tobacco Never  ? ? ?Social History  ? ?Socioeconomic History  ? Marital status: Married  ?Tobacco Use  ? Smoking status: Former  ?Types: Cigarettes  ?Quit date: 2003  ?Years since quitting: 20.3  ? Smokeless tobacco: Never  ? ?Objective:  ? ?Vitals:  ?05/07/21 0951  ?BP: 138/66  ?Pulse: 101  ?Temp: 36.4 ?C (97.6 ?F)  ?SpO2: (!) 86%  ?Weight: (!) 143.5 kg (316 lb 6.4 oz)  ?Height: 177.8 cm ('5\' 10"'$ )  ? ?Body mass index is 45.4 kg/m?. ? ?Physical Exam ?Constitutional:  ?Appearance: Normal appearance.  ?HENT:  ?Head: Normocephalic and atraumatic.  ?Pulmonary:  ?Effort: Pulmonary effort is normal.  ?Musculoskeletal:  ?General: Normal range of motion.  ?Cervical back: Normal range of motion.  ?Neurological:  ?General: No focal deficit present.  ?Mental Status: He is alert and oriented to person, place, and time. Mental status is at  baseline.  ?Psychiatric:  ?Mood and Affect: Mood normal.  ?Behavior: Behavior normal.  ?Thought Content: Thought content normal.  ? ? ? ?Labs, Imaging and Diagnostic Testing: ? ?I reviewed labs, chest Xr and UGI results. I reviewed dietician and psych notes ? ?Assessment and Plan:  ? ?There are no diagnoses linked to this encounter.  ? ?The patient meets weight loss surgery criteria. Due to the above reasons, I think laparoscopic  vertical sleeve gastrectomy is the best option for the patient.  ? ?We discussed LSG. We discussed the preoperative, operative and postoperative process. I explained the surgery in detail including the performance of an EGD near the end of the surgery to test for leak. We discussed the typical hospital course including a 1-2 day stay baring any complications. The patient was given educational material. I quoted the patient that most patients can lose up to 50-70% of their excess weight. We did discuss the possibility of weight regain several years after the procedure.  ? ?The risks of infection, bleeding, pain, scarring, weight regain, too little or too much weight loss, vitamin deficiencies and need for lifelong vitamin supplementation, hair loss, need for protein supplementation, leaks, stricture, reflux, food intolerance, gallstone formation, hernia, need for reoperation, need for open surgery, injury to spleen or surrounding structures, DVT's, PE, and death again discussed with the patient and the patient expressed understanding and desires to proceed with laparoscopic sleeve gastrectomy, possible open, intraoperative endoscopy. ? ?We discussed that before and after surgery that there would be an alteration in their diet. I explained that we may put them on a diet 2 weeks before surgery. I also explained that they would be on a liquid diet for 2 weeks after surgery. We discussed that they would have to avoid certain foods after surgery. We discussed the importance of physical activity as well as compliance with our dietary and supplement recommendations and routine follow-up.  ? ?

## 2021-05-19 NOTE — Transfer of Care (Signed)
Immediate Anesthesia Transfer of Care Note ? ?Patient: [name] [name] ? ?Procedure(s) Performed: XI ROBOTIC GASTRIC SLEEVE RESECTION ?UPPER GI ENDOSCOPY ? ?Patient Location: PACU ? ?Anesthesia Type:General ? ?Level of Consciousness: awake, alert  and oriented ? ?Airway & Oxygen Therapy: Patient Spontanous Breathing and Patient connected to face mask oxygen ? ?Post-op Assessment: Report given to RN and Post -op Vital signs reviewed and stable ? ?Post vital signs: Reviewed and stable ? ?Last Vitals:  ?Vitals Value Taken Time  ?BP 148/78 05/19/21 0941  ?Temp    ?Pulse 85 05/19/21 0943  ?Resp 16 05/19/21 0943  ?SpO2 100 % 05/19/21 0943  ?Vitals shown include unvalidated device data. ? ?Last Pain:  ?Vitals:  ? 05/19/21 0605  ?TempSrc: Oral  ?PainSc:   ?   ? ?  ? ?Complications: No notable events documented. ?

## 2021-05-19 NOTE — Anesthesia Postprocedure Evaluation (Signed)
Anesthesia Post Note ? ?Patient: [name] [name] ? ?Procedure(s) Performed: XI ROBOTIC GASTRIC SLEEVE RESECTION ?UPPER GI ENDOSCOPY ? ?  ? ?Patient location during evaluation: PACU ?Anesthesia Type: General ?Level of consciousness: awake and alert ?Pain management: pain level controlled ?Vital Signs Assessment: post-procedure vital signs reviewed and stable ?Respiratory status: spontaneous breathing, nonlabored ventilation, respiratory function stable and patient connected to nasal cannula oxygen ?Cardiovascular status: blood pressure returned to baseline and stable ?Postop Assessment: no apparent nausea or vomiting ?Anesthetic complications: no ? ? ?No notable events documented. ? ?Last Vitals:  ?Vitals:  ? 05/19/21 1337 05/19/21 1434  ?BP: 112/62 (!) 89/59  ?Pulse: 80 71  ?Resp: 18 18  ?Temp: 36.7 ?C   ?SpO2: 100% 92%  ?  ?Last Pain:  ?Vitals:  ? 05/19/21 1337  ?TempSrc: Oral  ?PainSc:   ? ? ?  ?  ?  ?  ?  ?  ? ?Suzette Battiest E ? ? ? ? ?

## 2021-05-19 NOTE — Op Note (Addendum)
Preop Diagnosis: Obesity Class III ? ?Postop Diagnosis: same ? ?Procedure performed: laparoscopic Sleeve Gastrectomy, upper endoscopy ? ?Assitant: Kaylyn Lim ? ?Indications:  ?The patient is a 71 y.o. year-old morbidly obese male who has been followed in the Bariatric Clinic as an outpatient. This patient was diagnosed with morbid obesity with a BMI of Body mass index is 43.75 kg/m?Marland Kitchen and significant co-morbidities including insulin dependent diabetes.  The patient was counseled extensively in the Bariatric Outpatient Clinic and after a thorough explanation of the risks and benefits of surgery (including death from complications, bowel leak, infection such as peritonitis and/or sepsis, internal hernia, bleeding, need for blood transfusion, bowel obstruction, organ failure, pulmonary embolus, deep venous thrombosis, wound infection, incisional hernia, skin breakdown, and others entailed on the consent form) and after a compliant diet and exercise program, the patient was scheduled for an elective laparoscopic sleeve gastrectomy. ? ?Description of Operation:  ?Following informed consent, the patient was taken to the operating room and placed on the operating table in the supine position.  He had previously received prophylactic antibiotics and subcutaneous heparin for DVT prophylaxis in the pre-op holding area.  After induction of general endotracheal anesthesia by the anesthesiologist, the patient underwent placement of sequential compression devices and an oro-gastric tube.  A timeout was confirmed by the surgery and anesthesia teams.  The patient was adequately padded at all pressure points and placed on a footboard to prevent slippage from the OR table during extremes of position during surgery.  He underwent a routine sterile prep and drape of her entire abdomen.   ? ?Next, A transverse incision was made at the left mid abdomen area and a 90m optical viewing trocar was introduced into the peritoneal cavity.  Pneumoperitoneum was applied with a high flow and low pressure. A laparoscope was inserted to confirm placement. A extraperitoneal block was then placed at the lateral abdominal wall using exparel diluted with marcaine. 5 additional incisions were placed: 1 117mtrocar to the right of the midline. 1 additional 52m57mrocar in the left mid abdominal area, 1 52mm74mocar in the left lateral abdomen, 1 5mm 36mcar in the left lower quadrant subcostal area, and a Nathanson retractor was placed through a subxiphoid incision. ? ?There were multiple adhesions of omentum to old ventral mesh. This was taken down with blunt dissection. There were 2 loops of small bowel adhered to the lower mesh that were kept in place. The camera port and 12 mm port were placed through the mesh. ? ?Next, a hole was created through the lesser omentum along the greater curve of the stomach to enter the lesser sac. The vessels along the greater omentum were  Then ligated and divided using the Harmonic scalpel moving towards the spleen and then short gastric vessels were ligated and divided in the same fashion to fully mobilize the fundus. The left crus was identified to ensure completion of the dissection. Next the antrum was measured and dissection continued inferiorly along the greater curve towards the pylorus and stopped 6cm from the pylorus. ? ? A 40Fr ViSiGi dilator was placed into the esophgaus and along the lesser curve of the stomach and placed on suction. 3 60mm 3m load robot stapler(s)  followed by 6 60mm w56m load robotic stapler(s)were used to make the resection along the antrum being sure to stay well away from the angularis by angling the jaws of the stapler towards the greater curve and later completing the resection staying along the ViSiGi Viviansuring the  fundus was not retained by appropriately retracting it lateral. Air was inserted through the [place] to perform a leak test showing no bubbles and a neutral lie of the  stomach. ? ?Next upper endoscopy was performed. GE junction was seen 44 cm from the teeth. No bleeding. No bubbles were seen and the sleeve and antrum distended appropriately. The specimen was then placed in an endocatch bag and removed by the 37m port. The fascia of the 155mport was closed with a 0 vicryl by suture passer. Hemostasis was ensured. Pneumoperitoneum was evacuated, all ports were removed and all incisions closed with 4-0 monocryl suture in subcuticular fashion. Steristrips and bandaids were put in place for dressing. The patient awoke from anesthesia and was brought to pacu in stable condition. All counts were correct. ? ?Estimated blood loss: <3056m ?Specimens:  ?Sleeve gastrectomy ? ?Local Anesthesia: 50 ml Exparel:0.5% Marcaine mix ? ?Post-Op Plan:  ?     Pain Management: PO, prn ?     Antibiotics: Prophylactic ?     Anticoagulation: Prophylactic, Starting now ?     Post Op Studies/Consults: Not applicable ?     Intended Discharge: within 48h ?     Intended Outpatient Follow-Up: Two Week ?     Intended Outpatient Studies: Not Applicable ?     Other: Not Applicable ? ?[name] [name] ? ? ?

## 2021-05-19 NOTE — Anesthesia Preprocedure Evaluation (Addendum)
Anesthesia Evaluation  ?Patient identified by MRN, date of birth, ID band ?Patient awake ? ? ? ?Reviewed: ?Allergy & Precautions, NPO status , Patient's Chart, lab work & pertinent test results ? ?Airway ?Mallampati: II ? ?TM Distance: >3 FB ?Neck ROM: Full ? ? ? Dental ? ?(+) Dental Advisory Given ?  ?Pulmonary ?sleep apnea , former smoker,  ?  ?breath sounds clear to auscultation ? ? ? ? ? ? Cardiovascular ?hypertension, Pt. on medications ? ?Rhythm:Regular Rate:Normal ? ? ?  ?Neuro/Psych ?negative neurological ROS ?   ? GI/Hepatic ?Neg liver ROS, GERD  ,  ?Endo/Other  ?diabetes, Type 2Morbid obesity ? Renal/GU ?negative Renal ROS  ? ?  ?Musculoskeletal ? ?(+) Arthritis ,  ? Abdominal ?  ?Peds ? Hematology ? ?(+) Blood dyscrasia, anemia ,   ?Anesthesia Other Findings ? ? Reproductive/Obstetrics ? ?  ? ? ? ? ? ? ? ? ? ? ? ? ? ?  ?  ? ? ? ? ? ? ? ? ?Anesthesia Physical ?Anesthesia Plan ? ?ASA: 3 ? ?Anesthesia Plan: General  ? ?Post-op Pain Management: Tylenol PO (pre-op)*, Ketamine IV* and Lidocaine infusion*  ? ?Induction: Intravenous and Rapid sequence ? ?PONV Risk Score and Plan: 2 and Dexamethasone, Ondansetron and Treatment may vary due to age or medical condition ? ?Airway Management Planned: Oral ETT ? ?Additional Equipment: None ? ?Intra-op Plan:  ? ?Post-operative Plan: Extubation in OR ? ?Informed Consent: I have reviewed the patients History and Physical, chart, labs and discussed the procedure including the risks, benefits and alternatives for the proposed anesthesia with the patient or authorized representative who has indicated his/her understanding and acceptance.  ? ? ? ?Dental advisory given ? ?Plan Discussed with: CRNA ? ?Anesthesia Plan Comments:   ? ? ? ? ? ?Anesthesia Quick Evaluation ? ?

## 2021-05-19 NOTE — Progress Notes (Signed)
PHARMACY CONSULT FOR:  Risk Assessment for Post-Discharge VTE Following Bariatric Surgery ? ?Post-Discharge VTE Risk Assessment: ?This patient's probability of 30-day post-discharge VTE is increased due to the factors marked: ?X Sleeve gastrectomy  ? Liver disorder (transplant, cirrhosis, or nonalcoholic steatohepatitis)  ? Hx of VTE  ? Hemorrhage requiring transfusion  ? GI perforation, leak, or obstruction  ? ====================================================  ?X  Male  ?X  Age >/=60 years  ?  BMI >/=50 kg/m2  ?  CHF  ?  Dyspnea at Rest  ?  Paraplegia  ? X Non-gastric-band surgery  ?  Operation Time >/=3 hr  ?  Return to OR   ?  Length of Stay >/= 3 d  ? Hypercoagulable condition  ? Significant venous stasis  ? ? ? ? ?Predicted probability of 30-day post-discharge VTE: 0.59% ? ?Recommendation for Discharge: ?Enoxaparin 40 mg Winnie q12h x 2 weeks post-discharge ? ? ? ?[name] [name] is a 71 y.o. male who underwent  laparoscopic Sleeve Gastrectomy on 05/19/21. ?  ?Case start: 0754 ?Case end: 0930 ? ? ?Allergies  ?Allergen Reactions  ? Mercury Swelling  ? ? ?Patient Measurements: ?Height: 5' 10.5" (179.1 cm) ?Weight: (!) 140.3 kg (309 lb 4 oz) ?IBW/kg (Calculated) : 74.15 ?Body mass index is 43.75 kg/m?. ? ?Recent Labs  ?  05/19/21 ?0953  ?WBC 9.6  ?HGB 11.4*  ?HCT 36.3*  ?PLT 217  ?CREATININE 1.25*  ? ?Estimated Creatinine Clearance: 78.2 mL/min (A) (by C-G formula based on SCr of 1.25 mg/dL (H)). ? ? ? ?Past Medical History:  ?Diagnosis Date  ? Arthritis   ? Cholelithiasis   ? Chronic cough   ? 04-26-2019 per pt occasionally productive in am  ? Difficulty breathing   ? with activity  since covid 07/ 2020,  pt has underlining ILD/ COPD w/ asthma   (04-26-2019 pt has breo inhaler ordered by pulmonologist daily, however, pt not taking as prescribed , still had not refilled this medication   ? Dyspnea   ? Full dentures   ? GERD (gastroesophageal reflux disease)   ? Heart murmur   ? DX IN 1975 WHEN GOIN INTO [employer] [employer]-  NOTHING SINCE  ? History of 2019 novel coronavirus disease (COVID-19)   ? tested positive 07-08-2018 (results in epic) , no hospital admission; pt had pneumonia, fever, chills, body aches, cough;   (04-26-2019 pt still has residual difficulty breathing with activity  ? Hyperlipidemia   ? Hypertension   ? followed by pcp   (04-26-2019  per pt never had a stress test)  ? ILD (interstitial lung disease) ([place])   ? pulmonologist--- dr Bernita Raisin  ? OSA treated with BiPAP   ? NOT USED IN 5-6 YEARS AGO  ? Pulmonary nodule   ? followed by pulmonology  ? Type 2 diabetes mellitus ([place])   ? followed by pcp   (04-26-2019 per pt not checking blood sugar at this time, his machine is broken, in process of getting  another one)  ? Wears glasses   ? ? ? ?Medications Prior to Admission  ?Medication Sig Dispense Refill Last Dose  ? albuterol (PROVENTIL HFA;VENTOLIN HFA) 108 (90 Base) MCG/ACT inhaler Inhale 2 puffs into the lungs every 4 (four) hours as needed for shortness of breath.     ? aspirin EC 81 MG tablet Take 81 mg by mouth daily.   05/18/2021  ? Calcium Citrate-Vitamin D 500-12.5 MG-MCG CHEW Chew 1 tablet by mouth 3 (three) times daily.   05/18/2021  ?  glipiZIDE (GLUCOTROL XL) 5 MG 24 hr tablet Take 5 mg by mouth daily with breakfast.   05/18/2021  ? metFORMIN (GLUCOPHAGE-XR) 500 MG 24 hr tablet Take 1,000 mg by mouth 2 (two) times daily.   05/18/2021  ? Multiple Vitamins-Minerals (BARIATRIC MULTIVITAMINS/IRON PO) Take 1 tablet by mouth daily.   05/18/2021  ? Omega-3 Fatty Acids (SM FISH OIL) 1000 MG CAPS Take 1,000 mg by mouth daily.   05/18/2021  ? pantoprazole (PROTONIX) 20 MG tablet Take 20 mg by mouth daily.   05/18/2021  ? polyethylene glycol (MIRALAX / GLYCOLAX) 17 g packet Take 17 g by mouth daily as needed for moderate constipation.   05/18/2021  ? telmisartan-hydrochlorothiazide (MICARDIS HCT) 40-12.5 MG tablet Take 1 tablet by mouth daily.    05/18/2021  ? Blood Glucose Monitoring Suppl (FIFTY50 GLUCOSE METER 2.0) w/Device  KIT Use as instructed   supply  ? Lancets (ACCU-CHEK MULTICLIX) lancets    supply  ? ONETOUCH VERIO test strip USE 1 STRIP TO CHECK GLUCOSE ONCE DAILY FOR BLOOD SUGAR  1 supply  ? ? ? ? ? ?Dia Sitter P ?05/19/2021,10:39 AM ? ?

## 2021-05-19 NOTE — Progress Notes (Signed)
?  Transition of Care (TOC) Screening Note ? ? ?Patient Details  ?Name: [name] [name] ?Date of Birth: [birth year] ? ? ?Transition of Care (TOC) CM/SW Contact:    ?Starsha Morning, LCSW ?Phone Number: ?05/19/2021, 11:23 AM ? ? ? ?Transition of Care Department [facility]) has reviewed patient and no TOC needs have been identified at this time. We will continue to monitor patient advancement through interdisciplinary progression rounds. If new patient transition needs arise, please place a TOC consult. ? ? ?

## 2021-05-19 NOTE — Anesthesia Procedure Notes (Signed)
Procedure Name: Intubation ?Date/Time: 05/19/2021 7:32 AM ?Performed by: Maxwell Caul, CRNA ?Pre-anesthesia Checklist: Patient identified, Emergency Drugs available, Suction available and Patient being monitored ?Patient Re-evaluated:Patient Re-evaluated prior to induction ?Oxygen Delivery Method: Circle system utilized ?Preoxygenation: Pre-oxygenation with 100% oxygen ?Induction Type: IV induction and Rapid sequence ?Laryngoscope Size: Mac and 4 ?Tube type: Oral ?Tube size: 7.5 mm ?Number of attempts: 1 ?Airway Equipment and Method: Stylet ?Placement Confirmation: ETT inserted through vocal cords under direct vision, positive ETCO2 and breath sounds checked- equal and bilateral ?Secured at: 21 cm ?Tube secured with: Tape ?Dental Injury: Teeth and Oropharynx as per pre-operative assessment  ? ? ? ? ?

## 2021-05-20 ENCOUNTER — Encounter (HOSPITAL_COMMUNITY): Payer: Self-pay | Admitting: General Surgery

## 2021-05-20 LAB — SURGICAL PATHOLOGY

## 2021-05-20 LAB — CBC WITH DIFFERENTIAL/PLATELET
Abs Immature Granulocytes: 0.09 10*3/uL — ABNORMAL HIGH (ref 0.00–0.07)
Basophils Absolute: 0.1 10*3/uL (ref 0.0–0.1)
Basophils Relative: 1 %
Eosinophils Absolute: 0.2 10*3/uL (ref 0.0–0.5)
Eosinophils Relative: 2 %
HCT: 27.6 % — ABNORMAL LOW (ref 39.0–52.0)
Hemoglobin: 9.1 g/dL — ABNORMAL LOW (ref 13.0–17.0)
Immature Granulocytes: 1 %
Lymphocytes Relative: 20 %
Lymphs Abs: 2.2 10*3/uL (ref 0.7–4.0)
MCH: 27.7 pg (ref 26.0–34.0)
MCHC: 33 g/dL (ref 30.0–36.0)
MCV: 84.1 fL (ref 80.0–100.0)
Monocytes Absolute: 0.9 10*3/uL (ref 0.1–1.0)
Monocytes Relative: 9 %
Neutro Abs: 7.1 10*3/uL (ref 1.7–7.7)
Neutrophils Relative %: 67 %
Platelets: 223 10*3/uL (ref 150–400)
RBC: 3.28 MIL/uL — ABNORMAL LOW (ref 4.22–5.81)
RDW: 14.9 % (ref 11.5–15.5)
WBC: 10.5 10*3/uL (ref 4.0–10.5)
nRBC: 0 % (ref 0.0–0.2)

## 2021-05-20 LAB — CBC
HCT: 27.3 % — ABNORMAL LOW (ref 39.0–52.0)
Hemoglobin: 8.7 g/dL — ABNORMAL LOW (ref 13.0–17.0)
MCH: 27 pg (ref 26.0–34.0)
MCHC: 31.9 g/dL (ref 30.0–36.0)
MCV: 84.8 fL (ref 80.0–100.0)
Platelets: 221 10*3/uL (ref 150–400)
RBC: 3.22 MIL/uL — ABNORMAL LOW (ref 4.22–5.81)
RDW: 15.1 % (ref 11.5–15.5)
WBC: 11.3 10*3/uL — ABNORMAL HIGH (ref 4.0–10.5)
nRBC: 0 % (ref 0.0–0.2)

## 2021-05-20 NOTE — Progress Notes (Signed)
Patient alert and oriented, pain is controlled. Patient is tolerating fluids, advanced to protein shake today, patient is tolerating well.  Reviewed Gastric sleeve discharge instructions with patient and patient is able to articulate understanding.  Provided information on BELT program, Support Group and [facility] outpatient pharmacy. All questions answered, will continue to monitor.  

## 2021-05-20 NOTE — Discharge Instructions (Signed)
GASTRIC BYPASS / SLEEVE  ?Home Care Instructions ? ?These instructions are to help you care for yourself when you go home. ? ?Call: If you have any problems. ?Call [phone] and ask for the surgeon on call ?If you have an emergency related to your surgery please use the ER at [facility].  ?Tell the ER staff that you are a new post-op gastric bypass or gastric sleeve patient ?  ?Signs and symptoms to report: Severe vomiting or nausea ?If you cannot handle clear liquids for longer than 1 day, call your surgeon  ?Abdominal pain which does not get better after taking your pain medication ?Fever greater than 100.4? F and chills ?Heart rate over 100 beats a minute ?Trouble breathing ?Chest pain ? Redness, swelling, drainage, or foul odor at incision (surgical) sites ? If your incisions open or pull apart ?Swelling or pain in calf (lower leg) ?Diarrhea (Loose bowel movements that happen often), frequent watery, uncontrolled bowel movements ?Constipation, (no bowel movements for 3 days) if this happens:  ?Take Milk of Magnesia, 2 tablespoons by mouth, 3 times a day for 2 days if needed ?Stop taking Milk of Magnesia once you have had a bowel movement ?Call your doctor if constipation continues ?Or ?Take Miralax  (instead of Milk of Magnesia) following the label instructions ?Stop taking Miralax once you have had a bowel movement ?Call your doctor if constipation continues ?Anything you think is ?abnormal for you? ?  ?Normal side effects after surgery: Unable to sleep at night or unable to concentrate ?Irritability ?Being tearful (crying) or depressed ?These are common complaints, possibly related to your anesthesia, stress of surgery and change in lifestyle, that usually go away a few weeks after surgery.  If these feelings continue, call your medical doctor.  ?Wound Care: You may have surgical glue, steri-strips, or staples over your incisions after surgery ?Surgical glue:  Looks like a clear film over your incisions  and will wear off a little at a time ?Steri-strips : Adhesive strips of tape over your incisions. You may notice a yellowish color on the skin under the steri-strips. This is used to make the   steri-strips stick better. Do not pull the steri-strips off - let them fall off ?Staples: Staples may be removed before you leave the hospital ?If you go home with staples, call [facility] at for an appointment with your surgeon?s nurse to have staples removed 10 days after surgery, [phone] ?Showering: You may shower two (2) days after your surgery unless your surgeon tells you differently ?Wash gently around incisions with warm soapy water, rinse well, and gently pat dry  ?If you have a drain (tube from your incision), you may need someone to hold this while you shower  ?No tub baths until staples are removed and incisions are healed   ?  ?Medications: Medications should be liquid or crushed if larger than the size of a dime ?Extended release pills (medication that releases a little bit at a time through the day) should not be crushed ?Depending on the size and number of medications you take, you may need to space (take a few throughout the day)/change the time you take your medications so that you do not over-fill your pouch (smaller stomach) ?Make sure you follow-up with your primary care physician to make medication changes needed during rapid weight loss and life-style changes ?If you have diabetes, follow up with the doctor that orders your diabetes medication(s) within one week after surgery and check   your blood sugar regularly. ?Do not drive while taking narcotics (pain medications) ?DO NOT take NSAID'S (Examples of NSAID's include ibuprofen, naproxen)  ?Diet:                    First 2 Weeks ? You will see the nutritionist about two (2) weeks after your surgery. The nutritionist will increase the types of foods you can eat if you are handling liquids well: ?If you have severe vomiting or nausea  and cannot handle clear liquids lasting longer than 1 day, call your surgeon  ?Protein Shake ?Drink at least 2 ounces of shake 5-6 times per day ?Each serving of protein shakes (usually 8 - 12 ounces) should have a minimum of:  ?15 grams of protein  ?And no more than 5 grams of carbohydrate  ?Goal for protein each day: ?Men = 80 grams per day ?Women = 60 grams per day ?Protein powder may be added to fluids such as non-fat milk or Lactaid milk or Soy milk (limit to 35 grams added protein powder per serving) ? ?Hydration ?Slowly increase the amount of water and other clear liquids as tolerated (See Acceptable Fluids) ?Slowly increase the amount of protein shake as tolerated  ? Sip fluids slowly and throughout the day ?May use sugar substitutes in small amounts (no more than 6 - 8 packets per day; i.e. Splenda) ? ?Fluid Goal ?The first goal is to drink at least 8 ounces of protein shake/drink per day (or as directed by the nutritionist);  See handout from pre-op Bariatric Education Class for examples of protein shake/drink.   ?Slowly increase the amount of protein shake you drink as tolerated ?You may find it easier to slowly sip shakes throughout the day ?It is important to get your proteins in first ?Your fluid goal is to drink 64 - 100 ounces of fluid daily ?It may take a few weeks to build up to this ?32 oz (or more) should be clear liquids  ?And  ?32 oz (or more) should be full liquids (see below for examples) ?Liquids should not contain sugar, caffeine, or carbonation ? ?Clear Liquids: ?Water or Sugar-free flavored water (i.e. Fruit H2O, Propel) ?Decaffeinated coffee or tea (sugar-free) ?Arias Weinert Lite, Wyler?s Lite, Minute Maid Lite ?Sugar-free Jell-O ?Bouillon or broth ?Sugar-free Popsicle:   *Less than 20 calories each; Limit 1 per day ? ?Full Liquids: ?Protein Shakes/Drinks + 2 choices per day of other full liquids ?Full liquids must be: ?No More Than 12 grams of Carbs per serving  ?No More Than 3 grams of Fat  per serving ?Strained low-fat cream soup ?Non-Fat milk ?Fat-free Lactaid Milk ?Sugar-free yogurt (Dannon Lite & Fit, Greek yogurt) ? ? ? ?  ?Vitamins and Minerals Start 1 day after surgery unless otherwise directed by your surgeon ?Bariatric Specific Complete Multivitamins ?Chewable Calcium Citrate with Vitamin D-3 ?(Example: 3 Chewable Calcium Plus 600 with Vitamin D-3) ?Take 500 mg three (3) times a day for a total of 1500 mg each day ?Do not take all 3 doses of calcium at one time as it may cause constipation, and you can only absorb 500 mg  at a time  ?Do not mix multivitamins containing iron with calcium supplements; take 2 hours apart ? ?Menstruating women and those at risk for anemia (a blood disease that causes weakness) may need extra iron ?Talk with your doctor to see if you need more iron ?If you need extra iron: Total daily Iron recommendation (including Vitamins) is 50 to 100   mg Iron/day ?Do not stop taking or change any vitamins or minerals until you talk to your nutritionist or surgeon ?Your nutritionist and/or surgeon must approve all vitamin and mineral supplements ?  ?Activity and Exercise: It is important to continue walking at home.  Limit your physical activity as instructed by your doctor.  During this time, use these guidelines: ?Do not lift anything greater than ten (10) pounds for at least two (2) weeks ?Do not go back to work or drive until your surgeon says you can ?You may have sex when you feel comfortable  ?It is VERY important for male patients to use a reliable birth control method; fertility often increases after surgery  ?Do not get pregnant for at least 18 months ?Start exercising as soon as your doctor tells you that you can ?Make sure your doctor approves any physical activity ?Start with a simple walking program ?Walk 5-15 minutes each day, 7 days per week.  ?Slowly increase until you are walking 30-45 minutes per day ?Consider joining our BELT program. [phone] or email  [email or web address] ?  ?Special Instructions Things to remember: ? ?Use your CPAP when sleeping if this applies to you, do not stop the use of CPAP unless directed by physician after a sleep study ?[place]

## 2021-05-20 NOTE — Progress Notes (Signed)
Reviewed Lovenox teaching kit and pt reviewed Lovenox "Patient-Self Injection Video".   ?

## 2021-05-20 NOTE — Progress Notes (Signed)
? ?  Progress Note: Metabolic and Bariatric Surgery Service  ? ?Chief Complaint/Subjective: ?Pain well controlled, tolerating diet ? ?Objective: ?Vital signs in last 24 hours: ?Temp:  [97.4 ?F (36.3 ?C)-98.3 ?F (36.8 ?C)] 97.8 ?F (36.6 ?C) (05/16 0602) ?Pulse Rate:  [71-89] 89 (05/16 0602) ?Resp:  [11-20] 18 (05/16 0602) ?BP: (89-148)/(51-78) 95/51 (05/16 0602) ?SpO2:  [90 %-100 %] 95 % (05/16 0602) ?Last BM Date : 05/18/21 ? ?Intake/Output from previous day: ?05/15 0701 - 05/16 0700 ?In: 2903.2 [P.O.:540; I.V.:2213.2; IV Piggyback:150] ?Out: 970 [Urine:920; Blood:50] ?Intake/Output this shift: ?No intake/output data recorded. ? ?Lungs: nonlabored ? ?Cardiovascular: RRR ? ?Abd: soft, nontender, incisions c/d/i ? ?Extremities: no edema ? ?Neuro: AOx4 ? ?Lab Results: ?CBC  ?Recent Labs  ?  05/19/21 ?0953 05/19/21 ?1245 05/20/21 ?0636  ?WBC 9.6  --  10.5  ?HGB 11.4* 10.6* 9.1*  ?HCT 36.3* 33.5* 27.6*  ?PLT 217  --  223  ? ?BMET ?Recent Labs  ?  05/19/21 ?0953  ?CREATININE 1.25*  ? ?PT/INR ?No results for input(s): LABPROT, INR in the last 72 hours. ?ABG ?No results for input(s): PHART, HCO3 in the last 72 hours. ? ?Invalid input(s): PCO2, PO2 ? ?Studies/Results: ? ?Anti-infectives: ?Anti-infectives (From admission, onward)  ? ? Start     Dose/Rate Route Frequency Ordered Stop  ? 05/19/21 0600  cefoTEtan (CEFOTAN) 2 g in sodium chloride 0.9 % 100 mL IVPB       ? 2 g ?200 mL/hr over 30 Minutes Intravenous On call to O.R. 05/19/21 4827 05/19/21 0735  ? ?  ? ? ?Medications: ?Scheduled Meds: ? acetaminophen  1,000 mg Oral Q8H  ? Or  ? acetaminophen (TYLENOL) oral liquid 160 mg/5 mL  1,000 mg Oral Q8H  ? enoxaparin   Does not apply Once  ? irbesartan  150 mg Oral Daily  ? And  ? hydrochlorothiazide  12.5 mg Oral Daily  ? Ensure Max Protein  2 oz Oral Q2H  ? ?Continuous Infusions: ? famotidine (PEPCID) IV 20 mg (05/19/21 2151)  ? ?PRN Meds:.albuterol, hydrALAZINE, morphine injection, ondansetron (ZOFRAN) IV, oxyCODONE,  simethicone ? ?Assessment/Plan: ?Patient Active Problem List  ? Diagnosis Date Noted  ? Morbid (severe) obesity due to excess calories ([place]) 05/19/2021  ? Allergic rhinitis 01/27/2019  ? Osteoarthritis of left knee 01/27/2019  ? Type 2 diabetes mellitus ([place]) 10/27/2013  ? Morbid obesity ([place]) 02/24/2013  ? History of tobacco use 02/24/2013  ? Essential (primary) hypertension 02/16/2013  ? HLD (hyperlipidemia) 12/16/2012  ? Contracture of palmar fascia 11/29/2012  ? ED (erectile dysfunction) of organic origin 05/20/2009  ? ?s/p Procedure(s): ?XI ROBOTIC GASTRIC SLEEVE RESECTION ?UPPER GI ENDOSCOPY 05/19/2021 ?-hgb drop 11 to 10 to 9: hold lovenox, recheck this afternoon ?-saline lock ?-plan for lovenox at home ? ?Disposition: ? LOS: 1 day  ?The patient will be in the hospital for normal postop protocol ? ?Mickeal Skinner, MD ?[phone] ?[facility] ? ? ? ?

## 2021-05-21 ENCOUNTER — Other Ambulatory Visit (HOSPITAL_COMMUNITY): Payer: Self-pay

## 2021-05-21 LAB — CBC WITH DIFFERENTIAL/PLATELET
Abs Immature Granulocytes: 0.08 10*3/uL — ABNORMAL HIGH (ref 0.00–0.07)
Basophils Absolute: 0.1 10*3/uL (ref 0.0–0.1)
Basophils Relative: 1 %
Eosinophils Absolute: 0.5 10*3/uL (ref 0.0–0.5)
Eosinophils Relative: 5 %
HCT: 25.9 % — ABNORMAL LOW (ref 39.0–52.0)
Hemoglobin: 8.5 g/dL — ABNORMAL LOW (ref 13.0–17.0)
Immature Granulocytes: 1 %
Lymphocytes Relative: 21 %
Lymphs Abs: 2 10*3/uL (ref 0.7–4.0)
MCH: 27.9 pg (ref 26.0–34.0)
MCHC: 32.8 g/dL (ref 30.0–36.0)
MCV: 84.9 fL (ref 80.0–100.0)
Monocytes Absolute: 0.9 10*3/uL (ref 0.1–1.0)
Monocytes Relative: 10 %
Neutro Abs: 6 10*3/uL (ref 1.7–7.7)
Neutrophils Relative %: 62 %
Platelets: 190 10*3/uL (ref 150–400)
RBC: 3.05 MIL/uL — ABNORMAL LOW (ref 4.22–5.81)
RDW: 15 % (ref 11.5–15.5)
WBC: 9.6 10*3/uL (ref 4.0–10.5)
nRBC: 0 % (ref 0.0–0.2)

## 2021-05-21 MED ORDER — ONDANSETRON 4 MG PO TBDP
4.0000 mg | ORAL_TABLET | Freq: Four times a day (QID) | ORAL | 0 refills | Status: DC | PRN
  Filled 2021-05-21: qty 20, 5d supply, fill #0

## 2021-05-21 NOTE — Progress Notes (Signed)
Discharge instructions discussed with patient and family, verbalized agreement and understanding 

## 2021-05-21 NOTE — TOC Benefit Eligibility Note (Signed)
Transition of Care (TOC) Benefit Eligibility Note  ? ? ?Patient Details  ?Name: [name] [name] ?MRN: [id number] ?Date of Birth: 04-06-1950 ? ? ?Medication/Dose: Enoxaparin 40 mg [place] q12h x 2 weeks post-discharge ? ?Covered?: Yes ? ?Tier: Other (tier 4) ? ?Prescription Coverage Preferred Pharmacy: local ? ?Spoke with Person/Company/Phone Number:: [employer] [phone] ? ?Co-Pay: $46.21 ? ?Prior Approval: No ? ?Deductible: Met ? ?  ? ? ? ?Kerin Salen ?Phone Number: ?05/21/2021, 9:45 AM ? ? ? ? ?

## 2021-05-22 ENCOUNTER — Telehealth (HOSPITAL_COMMUNITY): Payer: Self-pay | Admitting: *Deleted

## 2021-05-22 NOTE — Telephone Encounter (Signed)
Called to f/u with pt about Lovenox injections.  Pt will pick up medication from pharmacy today. Will follow up per protocol.

## 2021-05-23 ENCOUNTER — Telehealth (HOSPITAL_COMMUNITY): Payer: Self-pay | Admitting: *Deleted

## 2021-05-23 NOTE — Telephone Encounter (Signed)
Pt called and stated that Lovenox injections would cost around $200 due to deductible.  Pt stated that he cannot afford medication at this time, but will attempt to get the medication on Wednesday once he gets his social security check.  Pt instructed to be intentional about staying hydrated and routinely walking around. Instructed pt to call surgeon if not able to get medication on Wednesday.  Will f/u as needed.

## 2021-05-23 NOTE — Telephone Encounter (Signed)
1.  Tell me about your pain and pain management? Pt denies any pain.   2.  Let's talk about fluid intake.  How much total fluid are you taking in? Pt states that he is getting in more than 64oz of fluid including protein shakes, bottled water, broth, Gatorade and jello.  Pt stated he had 15f oz yesterday.  3.  How much protein have you taken in the last 2 days? Pt states he is meeting his goal of 80g of protein each day with the protein shakes.  4.  Have you had nausea?  Tell me about when have experienced nausea and what you did to help? Pt denies any current nausea. Pt stated he had nausea x 1 and the medication relieved his symptoms.  Denies vomiting.   5.  Has the frequency or color changed with your urine? Pt states that he is urinating "fine" with no changes in frequency or urgency.     6.  Tell me what your incisions look like? "Right side incisions look fine, but the left bottom incisions on my stomach has a big bruise."  Pt denies a fever, chills.  Pt states that it is not swollen, open, or draining.  Pt encouraged to call [facility] if symptoms worsen.   7.  Have you been passing gas? BM? Pt states that he is having BMs. Last BM 05/22/21.      8.  If a problem or question were to arise who would you call?  Do you know contact numbers for [facility], and [facility]? Pt denies dehydration symptoms.  Pt can describe s/sx of dehydration.  Pt knows to call [facility] for surgical, [facility] for nutrition, and [place].   9.  How has the walking going? Pt states he is walking around and able to be active without difficulty.   10. Are you still using your incentive spirometer?  If so, how often? Pt states that he is doing the I.S. 3-4x/day. Pt encouraged to use incentive spirometer, at least 10x every hour while awake until he sees the surgeon.  11.  How are your vitamins and calcium going?  How are you taking them? Pt states that he is taking his supplements and vitamins  without difficulty.   LOVENOX: Pt states that he has not picked up his Lovenox from pharmacy yet.  Instructed pt to call pharmacy and follow up so that he can start VTE therapy as soon as possible. Reinforced education about taking injections q12h and rotating injection sites.  Pt also instructed to monitor for unusual bruising and/or signs of bleeding.  Reminded patient that the first 30 days post-operatively are important for successful recovery.  Practice good hand hygiene, wearing a mask when appropriate (since optional in most places), and minimizing exposure to people who live outside of the home, especially if they are exhibiting any respiratory, GI, or illness-like symptoms.

## 2021-06-03 ENCOUNTER — Ambulatory Visit: Payer: Medicare Other | Admitting: Sports Medicine

## 2021-06-03 ENCOUNTER — Encounter: Payer: Self-pay | Admitting: Dietician

## 2021-06-03 ENCOUNTER — Encounter: Payer: Medicare Other | Attending: General Surgery | Admitting: Dietician

## 2021-06-03 DIAGNOSIS — E119 Type 2 diabetes mellitus without complications: Secondary | ICD-10-CM | POA: Insufficient documentation

## 2021-06-03 DIAGNOSIS — E669 Obesity, unspecified: Secondary | ICD-10-CM | POA: Diagnosis not present

## 2021-06-03 DIAGNOSIS — Z794 Long term (current) use of insulin: Secondary | ICD-10-CM | POA: Insufficient documentation

## 2021-06-03 NOTE — Progress Notes (Addendum)
2 Week Post-Operative Nutrition Class   Patient was seen on 06/03/2021 for Post-Operative Nutrition education at [facility].  Pt arrived with his wife who attended the class as well.   Surgery date: 05/19/2021 Surgery type: Sleeve Gastrectomy   Anthropometrics  Start weight at [facility]: 324.7 lbs (date: 10/29/2020)  Weight: 299.7 pounds BMI: 42.39 kg/m2     Clinical  Medical hx: Asthma, diabetes, GERD, COPD, hyperlipidemia, HTN Medications: no change: metformin, glipizide, omega 3, januvia, vitamin D Labs: none in the EMR Notable signs/symptoms: winded easier since covid in 2020 Any previous deficiencies? Iron, vitamin D   Body Composition Scale 06/03/2021  Current Body Weight 299.7  Total Body Fat % 37.9  Visceral Fat 35  Fat-Free Mass % 62.0   Total Body Water % 43.0  Muscle-Mass lbs 56.5  BMI 42.1  Body Fat Displacement          Torso  lbs 70.6         Left Leg  lbs 14.1         Right Leg  lbs 14.1         Left Arm  lbs 7.0         Right Arm   lbs 7.0      The following the learning objectives were met by the patient during this course: Identifies Phase 3 (Soft, High Proteins) Dietary Goals and will begin from 2 weeks post-operatively to 2 months post-operatively Identifies appropriate sources of fluids and proteins  Identifies appropriate fat sources and healthy verses unhealthy fat types   States protein recommendations and appropriate sources post-operatively Identifies the need for appropriate texture modifications, mastication, and bite sizes when consuming solids Identifies appropriate fat consumption and sources Identifies appropriate multivitamin and calcium sources post-operatively Describes the need for physical activity post-operatively and will follow MD recommendations States when to call healthcare provider regarding medication questions or post-operative complications   Handouts given during class include: Phase 3A:  Soft, High Protein Diet Handout Phase 3 High Protein Meals Healthy Fats   Follow-Up Plan: Patient will follow-up at [facility] in 6 weeks for 2 month post-op nutrition visit for diet advancement per MD.

## 2021-06-04 NOTE — Discharge Summary (Signed)
Physician Discharge Summary  [name] [name] TDH:[id number] DOB: 10/27/1950 DOA: 05/19/2021  PCP: Cyndi Bender, PA-C  Admit date: 05/19/2021 Discharge date: 05/21/2021  Recommendations for Outpatient Follow-up:   (include homehealth, outpatient follow-up instructions, specific recommendations for PCP to follow-up on, etc.)   Follow-up Information     Haddy Mullinax, Arta Bruce, MD. Go on 06/12/2021.   Specialty: General Surgery Why: at 10:50am.  Please arrive 15 minutes prior to your appointment time.  Thank you. Contact information: [address] [place] [address] [phone]         Estalene Bergey, Arta Bruce, MD. Go on 07/16/2021.   Specialty: General Surgery Why: at 9am.  Please arrive 15 minutes prior to your appointment time.  Thank you. Contact information: [address] [place] [address] [phone]                Discharge Diagnoses:  Principal Problem:   Morbid (severe) obesity due to excess calories [facility])   Surgical Procedure: laparoscopic sleeve gastrectomy, upper endoscopy  Discharge Condition: Good Disposition: Home  Diet recommendation: Postoperative sleeve gastrectomy diet (liquids only)  Filed Weights   05/19/21 0526  Weight: (!) 140.3 kg     Hospital Course:  The patient was admitted after undergoing laparoscopic sleeve gastrectomy. POD 0 he ambulated well. POD 1 he was started on the water diet protocol and tolerated 300 ml in the first shift. Once meeting the water amount he was advanced to bariatric protein shakes which they tolerated and were discharged home POD 2.  Treatments: surgery: laparoscopic sleeve gastrectomy  Discharge Instructions  Discharge Instructions     Ambulate hourly while awake   Complete by: As directed    Call MD for:  difficulty breathing, headache or visual disturbances   Complete by: As directed    Call MD for:  persistant dizziness or light-headedness   Complete by: As directed     Call MD for:  persistant nausea and vomiting   Complete by: As directed    Call MD for:  redness, tenderness, or signs of infection (pain, swelling, redness, odor or green/yellow discharge around incision site)   Complete by: As directed    Call MD for:  severe uncontrolled pain   Complete by: As directed    Call MD for:  temperature >101 F   Complete by: As directed    Diet bariatric full liquid   Complete by: As directed    Discharge wound care:   Complete by: As directed    Remove Bandaids tomorrow, ok to shower tomorrow. Steristrips may fall off in 1-3 weeks.   Incentive spirometry   Complete by: As directed    Perform hourly while awake      Allergies as of 05/21/2021       Reactions   Mercury Swelling        Medication List     STOP taking these medications    glipiZIDE 5 MG 24 hr tablet Commonly known as: GLUCOTROL XL   telmisartan-hydrochlorothiazide 40-12.5 MG tablet Commonly known as: MICARDIS HCT       TAKE these medications    accu-chek multiclix lancets   albuterol 108 (90 Base) MCG/ACT inhaler Commonly known as: VENTOLIN HFA Inhale 2 puffs into the lungs every 4 (four) hours as needed for shortness of breath.   aspirin EC 81 MG tablet Take 81 mg by mouth daily.   BARIATRIC MULTIVITAMINS/IRON PO Take 1 tablet by mouth daily.   Calcium Citrate-Vitamin  D 500-12.5 MG-MCG Chew Chew 1 tablet by mouth 3 (three) times daily.   Fifty50 Glucose Meter 2.0 w/Device Kit Use as instructed   metFORMIN 500 MG 24 hr tablet Commonly known as: GLUCOPHAGE-XR Take 1,000 mg by mouth 2 (two) times daily. Notes to patient: Monitor Blood Sugar Frequently and keep a log for primary care physician, you may need to adjust medication dosage with rapid weight loss.     ondansetron 4 MG disintegrating tablet Commonly known as: ZOFRAN-ODT Dissolve 1 tablet (4 mg total) by mouth every 6 hours as needed for nausea or vomiting.   OneTouch Verio test strip Generic  drug: glucose blood USE 1 STRIP TO CHECK GLUCOSE ONCE DAILY FOR BLOOD SUGAR   pantoprazole 20 MG tablet Commonly known as: PROTONIX Take 20 mg by mouth daily.   polyethylene glycol 17 g packet Commonly known as: MIRALAX / GLYCOLAX Take 17 g by mouth daily as needed for moderate constipation.   SM Fish Oil 1000 MG Caps Take 1,000 mg by mouth daily.               Discharge Care Instructions  (From admission, onward)           Start     Ordered   05/21/21 0000  Discharge wound care:       Comments: Remove Bandaids tomorrow, ok to shower tomorrow. Steristrips may fall off in 1-3 weeks.   05/21/21 0850            Follow-up Information     Shynia Daleo, Arta Bruce, MD. Go on 06/12/2021.   Specialty: General Surgery Why: at 10:50am.  Please arrive 15 minutes prior to your appointment time.  Thank you. Contact information: [address] [place] [address] [phone]         Nathanuel Cabreja, Arta Bruce, MD. Go on 07/16/2021.   Specialty: General Surgery Why: at 9am.  Please arrive 15 minutes prior to your appointment time.  Thank you. Contact information: [address] [place] [address] [phone]                  The results of significant diagnostics from this hospitalization (including imaging, microbiology, ancillary and laboratory) are listed below for reference.    Significant Diagnostic Studies: No results found.  Labs: Basic Metabolic Panel: No results for input(s): NA, K, CL, CO2, GLUCOSE, BUN, CREATININE, CALCIUM, MG, PHOS in the last 168 hours. Liver Function Tests: No results for input(s): AST, ALT, ALKPHOS, BILITOT, PROT, ALBUMIN in the last 168 hours.  CBC: No results for input(s): WBC, NEUTROABS, HGB, HCT, MCV, PLT in the last 168 hours.  CBG: No results for input(s): GLUCAP in the last 168 hours.  Principal Problem:   Morbid (severe) obesity due to excess calories [facility])   VTE plan: I will prescribe  outpatient chemical prophylaxis of enoxaparin due to this increased risk [email or web address])  Time coordinating discharge: 15 min

## 2021-06-09 ENCOUNTER — Telehealth: Payer: Self-pay | Admitting: Dietician

## 2021-06-09 NOTE — Telephone Encounter (Signed)
RD called pt to verify fluid intake once starting soft, solid proteins 2 week post-bariatric surgery.   Daily Fluid intake: Daily Protein intake: Bowel Habits:   Concerns/issues:    Left voicemail message

## 2021-06-10 DIAGNOSIS — Z903 Acquired absence of stomach [part of]: Secondary | ICD-10-CM | POA: Diagnosis not present

## 2021-06-10 DIAGNOSIS — E1169 Type 2 diabetes mellitus with other specified complication: Secondary | ICD-10-CM | POA: Diagnosis not present

## 2021-06-10 DIAGNOSIS — D509 Iron deficiency anemia, unspecified: Secondary | ICD-10-CM | POA: Diagnosis not present

## 2021-06-10 NOTE — Telephone Encounter (Signed)
Opened in error

## 2021-07-15 ENCOUNTER — Encounter: Payer: Self-pay | Admitting: Dietician

## 2021-07-15 ENCOUNTER — Encounter: Payer: Medicare Other | Attending: Physician Assistant | Admitting: Dietician

## 2021-07-15 DIAGNOSIS — E669 Obesity, unspecified: Secondary | ICD-10-CM

## 2021-07-15 DIAGNOSIS — E611 Iron deficiency: Secondary | ICD-10-CM | POA: Insufficient documentation

## 2021-07-15 DIAGNOSIS — Z794 Long term (current) use of insulin: Secondary | ICD-10-CM

## 2021-07-15 DIAGNOSIS — Z713 Dietary counseling and surveillance: Secondary | ICD-10-CM | POA: Diagnosis not present

## 2021-07-15 DIAGNOSIS — E569 Vitamin deficiency, unspecified: Secondary | ICD-10-CM | POA: Diagnosis not present

## 2021-07-15 DIAGNOSIS — E119 Type 2 diabetes mellitus without complications: Secondary | ICD-10-CM | POA: Diagnosis not present

## 2021-07-15 NOTE — Progress Notes (Signed)
Bariatric Nutrition Follow-Up Visit Medical Nutrition Therapy  Appt Start Time: 9:06   End Time: 9:35  Surgery date: 05/19/2021 Surgery type: Sleeve Gastrectomy   NUTRITION ASSESSMENT   Anthropometrics  Start weight at [facility]: 324.7 lbs (date: 10/29/2020)  Weight: 279.1 pounds BMI: 39.48 kg/m2     Clinical  Medical hx: Asthma, diabetes, GERD, COPD, hyperlipidemia, HTN Medications: no change: metformin, omega 3, januvia, vitamin D Labs: Hemoglobin 8.5; RBC 3.05; HCT 25.9 Notable signs/symptoms: winded easier since covid in 2020 Any previous deficiencies? Iron, vitamin D   Body Composition Scale 06/03/2021 07/15/2021  Current Body Weight 299.7 279.1  Total Body Fat % 37.9 35.9  Visceral Fat 35 30  Fat-Free Mass % 62.0 64.0   Total Body Water % 43.0 45.0  Muscle-Mass lbs 56.5 52.6  BMI 42.1 39.2  Body Fat Displacement           Torso  lbs 70.6 62.2         Left Leg  lbs 14.1 12.4         Right Leg  lbs 14.1 12.4         Left Arm  lbs 7.0 6.2         Right Arm   lbs 7.0 6.2     Lifestyle & Dietary Hx  Pt arrived with his wife, who is supportive.  Pt states his wife is also following the same food plan. Pt's wife is keeping a food log for every meal every day.  Pt states he is taking two less medications, and is taking less metformin. Pt states he is participating in water exercises at the [facility] 3 days a week.  Estimated daily fluid intake: 64 oz Estimated daily protein intake: 80 g Supplements: multi vitamin and calcium Current average weekly physical activity: water aerobics 3 times a week at the [facility]  24-Hr Dietary Recall First Meal: coffee, sausage, eggs Snack: nuts  Second Meal: lunch meat, cheese  Snack: cheese stick  Third Meal: hamburger patty with cheese Snack: nuts Beverages: water, coffee  Post-Op Goals/ Signs/ Symptoms Using straws: no Drinking while eating: no Chewing/swallowing difficulties: no Changes in vision: no Changes to mood/headaches:  no Hair loss/changes to skin/nails: no Difficulty focusing/concentrating: no Sweating: no Limb weakness: no Dizziness/lightheadedness: no Palpitations: no  Carbonated/caffeinated beverages: no N/V/D/C/Gas: no Abdominal pain: no Dumping syndrome: no    NUTRITION DIAGNOSIS  Overweight/obesity ([place]-3.3) related to past poor dietary habits and physical inactivity as evidenced by completed bariatric surgery and following dietary guidelines for continued weight loss and healthy nutrition status.     NUTRITION INTERVENTION Nutrition counseling (C-1) and education (E-2) to facilitate bariatric surgery goals, including: Diet advancement to the next phase (phase 4) now including non-starchy vegetables  The importance of consuming adequate calories as well as certain nutrients daily due to the body's need for essential vitamins, minerals, and fats The importance of daily physical activity and to reach a goal of at least 150 minutes of moderate to vigorous physical activity weekly (or as directed by their physician) due to benefits such as increased musculature and improved lab values The importance of intuitive eating specifically learning hunger-satiety cues and understanding the importance of learning a new body: The importance of mindful eating to avoid grazing behaviors   Handouts Provided Include  Phase 4 Food Plan  Learning Style & Readiness for Change Teaching method utilized: Visual & Auditory  Demonstrated degree of understanding via: Teach Back  Readiness Level: action Barriers to learning/adherence to lifestyle  change: non identified  RD's Notes for Next Visit Assess adherence to pt chosen goals   MONITORING & EVALUATION Dietary intake, weekly physical activity, body weight.  Next Steps Patient is to follow-up in 4 months for 6 month post-op follow-up in November.

## 2021-09-10 DIAGNOSIS — D649 Anemia, unspecified: Secondary | ICD-10-CM | POA: Diagnosis not present

## 2021-09-10 DIAGNOSIS — E1169 Type 2 diabetes mellitus with other specified complication: Secondary | ICD-10-CM | POA: Diagnosis not present

## 2021-09-10 DIAGNOSIS — E785 Hyperlipidemia, unspecified: Secondary | ICD-10-CM | POA: Diagnosis not present

## 2021-09-10 DIAGNOSIS — I10 Essential (primary) hypertension: Secondary | ICD-10-CM | POA: Diagnosis not present

## 2021-09-15 DIAGNOSIS — M1712 Unilateral primary osteoarthritis, left knee: Secondary | ICD-10-CM | POA: Diagnosis not present

## 2021-10-27 DIAGNOSIS — Z23 Encounter for immunization: Secondary | ICD-10-CM | POA: Diagnosis not present

## 2021-11-10 ENCOUNTER — Encounter: Payer: Medicare Other | Admitting: Dietician

## 2021-11-13 ENCOUNTER — Encounter: Payer: Medicare Other | Attending: Physician Assistant | Admitting: Dietician

## 2021-11-13 ENCOUNTER — Encounter: Payer: Self-pay | Admitting: Dietician

## 2021-11-13 VITALS — Ht 70.5 in | Wt 253.3 lb

## 2021-11-13 DIAGNOSIS — Z794 Long term (current) use of insulin: Secondary | ICD-10-CM | POA: Diagnosis not present

## 2021-11-13 DIAGNOSIS — E119 Type 2 diabetes mellitus without complications: Secondary | ICD-10-CM | POA: Insufficient documentation

## 2021-11-13 DIAGNOSIS — E669 Obesity, unspecified: Secondary | ICD-10-CM | POA: Diagnosis not present

## 2021-11-13 NOTE — Progress Notes (Signed)
Bariatric Nutrition Follow-Up Visit Medical Nutrition Therapy  Appt Start Time: 1:50  End Time: 2:19  Surgery date: 05/19/2021 Surgery type: Sleeve Gastrectomy   NUTRITION ASSESSMENT   Anthropometrics  Start weight at [facility]: 324.7 lbs (date: 10/29/2020)  Weight:  pounds BMI:  kg/m2     Clinical  Medical hx: Asthma, diabetes, GERD, COPD, hyperlipidemia, HTN Medications: no change: metformin, omega 3, januvia, vitamin D Labs: Hemoglobin 8.5; RBC 3.05; HCT 25.9 Notable signs/symptoms: winded easier since covid in 2020 Any previous deficiencies? Iron, vitamin D   Body Composition Scale 06/03/2021 07/15/2021 11/13/2021  Current Body Weight 299.7 279.1 253.3  Total Body Fat % 37.9 35.9 33.0  Visceral Fat 35 30 25  Fat-Free Mass % 62.0 64.0 66.9   Total Body Water % 43.0 45.0 47.9  Muscle-Mass lbs 56.5 52.6 47.7  BMI 42.1 39.2 35.5  Body Fat Displacement            Torso  lbs 70.6 62.2 51.9         Left Leg  lbs 14.1 12.4 10.3         Right Leg  lbs 14.1 12.4 10.3         Left Arm  lbs 7.0 6.2 5.1         Right Arm   lbs 7.0 6.2 5.1     Lifestyle & Dietary Hx  Pt arrived with his wife, who is supportive.  Pt's wife is on the pathway for bariatric surgery as well. Pt states he is still going to the [facility] for water aerobics 3 days a week. Pt agreeable to using light weights on other days of the week to decrease/stop lean muscle loss.  Estimated daily fluid intake: 64 oz Estimated daily protein intake: 80 g Supplements: multi vitamin and calcium Current average weekly physical activity: water aerobics 3 times a week at the [facility]  24-Hr Dietary Recall First Meal: 2 pieces of bacon and eggs and toast (whole grain) Snack: nuts  Second Meal: beef patty with cheese  Snack: cheese stick  Third Meal: meat loaf or chicken Snack: nuts Beverages: water, coffee  Post-Op Goals/ Signs/ Symptoms Using straws: no Drinking while eating: no Chewing/swallowing difficulties: no Changes  in vision: no Changes to mood/headaches: no Hair loss/changes to skin/nails: no Difficulty focusing/concentrating: no Sweating: no Limb weakness: no Dizziness/lightheadedness: no Palpitations: no  Carbonated/caffeinated beverages: no N/V/D/C/Gas: no Abdominal pain: no Dumping syndrome: no    NUTRITION DIAGNOSIS  Overweight/obesity ([place]-3.3) related to past poor dietary habits and physical inactivity as evidenced by completed bariatric surgery and following dietary guidelines for continued weight loss and healthy nutrition status.     NUTRITION INTERVENTION Nutrition counseling (C-1) and education (E-2) to facilitate bariatric surgery goals, including: Diet advancement to the next phase (phase 5) now including starchy vegetables  The importance of consuming adequate calories as well as certain nutrients daily due to the body's need for essential vitamins, minerals, and fats The importance of daily physical activity and to reach a goal of at least 150 minutes of moderate to vigorous physical activity weekly (or as directed by their physician) due to benefits such as increased musculature and improved lab values The importance of intuitive eating specifically learning hunger-satiety cues and understanding the importance of learning a new body: The importance of mindful eating to avoid grazing behaviors   Goals -New: focus on non-starchy vegetables at lunch and dinner and add starchy vegetable. -New: Resistance exercises to slow/eliminate muscle loss with your weight loss.  Handouts Provided Include  Phase 5 Food Plan  Learning Style & Readiness for Change Teaching method utilized: Visual & Auditory  Demonstrated degree of understanding via: Teach Back  Readiness Level: action Barriers to learning/adherence to lifestyle change: non identified  RD's Notes for Next Visit Assess adherence to pt chosen goals   MONITORING & EVALUATION Dietary intake, weekly physical activity, body  weight.  Next Steps Patient is to follow-up in 4 months for 6 month post-op follow-up in November.

## 2021-12-15 DIAGNOSIS — M1712 Unilateral primary osteoarthritis, left knee: Secondary | ICD-10-CM | POA: Diagnosis not present

## 2021-12-18 DIAGNOSIS — E785 Hyperlipidemia, unspecified: Secondary | ICD-10-CM | POA: Diagnosis not present

## 2021-12-18 DIAGNOSIS — D649 Anemia, unspecified: Secondary | ICD-10-CM | POA: Diagnosis not present

## 2021-12-18 DIAGNOSIS — I10 Essential (primary) hypertension: Secondary | ICD-10-CM | POA: Diagnosis not present

## 2021-12-18 DIAGNOSIS — M1712 Unilateral primary osteoarthritis, left knee: Secondary | ICD-10-CM | POA: Diagnosis not present

## 2021-12-18 DIAGNOSIS — E1169 Type 2 diabetes mellitus with other specified complication: Secondary | ICD-10-CM | POA: Diagnosis not present

## 2021-12-25 DIAGNOSIS — E559 Vitamin D deficiency, unspecified: Secondary | ICD-10-CM | POA: Diagnosis not present

## 2021-12-25 DIAGNOSIS — M79609 Pain in unspecified limb: Secondary | ICD-10-CM | POA: Diagnosis not present

## 2021-12-25 DIAGNOSIS — Z01818 Encounter for other preprocedural examination: Secondary | ICD-10-CM | POA: Diagnosis not present

## 2021-12-25 DIAGNOSIS — Z79899 Other long term (current) drug therapy: Secondary | ICD-10-CM | POA: Diagnosis not present

## 2022-01-26 DIAGNOSIS — R9431 Abnormal electrocardiogram [ECG] [EKG]: Secondary | ICD-10-CM | POA: Diagnosis not present

## 2022-01-26 DIAGNOSIS — R569 Unspecified convulsions: Secondary | ICD-10-CM | POA: Diagnosis not present

## 2022-01-26 DIAGNOSIS — R42 Dizziness and giddiness: Secondary | ICD-10-CM | POA: Diagnosis not present

## 2022-01-26 DIAGNOSIS — R410 Disorientation, unspecified: Secondary | ICD-10-CM | POA: Diagnosis not present

## 2022-01-26 DIAGNOSIS — Z87891 Personal history of nicotine dependence: Secondary | ICD-10-CM | POA: Diagnosis not present

## 2022-01-26 DIAGNOSIS — I10 Essential (primary) hypertension: Secondary | ICD-10-CM | POA: Diagnosis not present

## 2022-01-26 DIAGNOSIS — E119 Type 2 diabetes mellitus without complications: Secondary | ICD-10-CM | POA: Diagnosis not present

## 2022-01-26 DIAGNOSIS — I440 Atrioventricular block, first degree: Secondary | ICD-10-CM | POA: Diagnosis not present

## 2022-01-26 DIAGNOSIS — R4182 Altered mental status, unspecified: Secondary | ICD-10-CM | POA: Diagnosis not present

## 2022-01-26 DIAGNOSIS — G060 Intracranial abscess and granuloma: Secondary | ICD-10-CM | POA: Diagnosis not present

## 2022-01-26 DIAGNOSIS — Z743 Need for continuous supervision: Secondary | ICD-10-CM | POA: Diagnosis not present

## 2022-01-26 DIAGNOSIS — E7800 Pure hypercholesterolemia, unspecified: Secondary | ICD-10-CM | POA: Diagnosis not present

## 2022-01-26 DIAGNOSIS — R404 Transient alteration of awareness: Secondary | ICD-10-CM | POA: Diagnosis not present

## 2022-01-26 DIAGNOSIS — G9389 Other specified disorders of brain: Secondary | ICD-10-CM | POA: Diagnosis not present

## 2022-01-27 DIAGNOSIS — Z8249 Family history of ischemic heart disease and other diseases of the circulatory system: Secondary | ICD-10-CM | POA: Diagnosis not present

## 2022-01-27 DIAGNOSIS — G9389 Other specified disorders of brain: Secondary | ICD-10-CM | POA: Diagnosis not present

## 2022-01-27 DIAGNOSIS — K3189 Other diseases of stomach and duodenum: Secondary | ICD-10-CM | POA: Diagnosis not present

## 2022-01-27 DIAGNOSIS — R4182 Altered mental status, unspecified: Secondary | ICD-10-CM | POA: Diagnosis not present

## 2022-01-27 DIAGNOSIS — Z87891 Personal history of nicotine dependence: Secondary | ICD-10-CM | POA: Diagnosis not present

## 2022-01-27 DIAGNOSIS — R001 Bradycardia, unspecified: Secondary | ICD-10-CM | POA: Diagnosis not present

## 2022-01-27 DIAGNOSIS — C7931 Secondary malignant neoplasm of brain: Secondary | ICD-10-CM | POA: Diagnosis not present

## 2022-01-27 DIAGNOSIS — I10 Essential (primary) hypertension: Secondary | ICD-10-CM | POA: Diagnosis not present

## 2022-01-27 DIAGNOSIS — Z79899 Other long term (current) drug therapy: Secondary | ICD-10-CM | POA: Diagnosis not present

## 2022-01-27 DIAGNOSIS — T402X5A Adverse effect of other opioids, initial encounter: Secondary | ICD-10-CM | POA: Diagnosis not present

## 2022-01-27 DIAGNOSIS — G936 Cerebral edema: Secondary | ICD-10-CM | POA: Diagnosis not present

## 2022-01-27 DIAGNOSIS — E7800 Pure hypercholesterolemia, unspecified: Secondary | ICD-10-CM | POA: Diagnosis not present

## 2022-01-27 DIAGNOSIS — J9811 Atelectasis: Secondary | ICD-10-CM | POA: Diagnosis not present

## 2022-01-27 DIAGNOSIS — C719 Malignant neoplasm of brain, unspecified: Secondary | ICD-10-CM | POA: Diagnosis not present

## 2022-01-27 DIAGNOSIS — K319 Disease of stomach and duodenum, unspecified: Secondary | ICD-10-CM | POA: Diagnosis not present

## 2022-01-27 DIAGNOSIS — R569 Unspecified convulsions: Secondary | ICD-10-CM | POA: Diagnosis not present

## 2022-01-27 DIAGNOSIS — N179 Acute kidney failure, unspecified: Secondary | ICD-10-CM | POA: Diagnosis not present

## 2022-01-27 DIAGNOSIS — C7989 Secondary malignant neoplasm of other specified sites: Secondary | ICD-10-CM | POA: Diagnosis not present

## 2022-01-27 DIAGNOSIS — C713 Malignant neoplasm of parietal lobe: Secondary | ICD-10-CM | POA: Diagnosis not present

## 2022-01-27 DIAGNOSIS — E119 Type 2 diabetes mellitus without complications: Secondary | ICD-10-CM | POA: Diagnosis not present

## 2022-01-27 DIAGNOSIS — R935 Abnormal findings on diagnostic imaging of other abdominal regions, including retroperitoneum: Secondary | ICD-10-CM | POA: Diagnosis not present

## 2022-01-27 DIAGNOSIS — E785 Hyperlipidemia, unspecified: Secondary | ICD-10-CM | POA: Diagnosis not present

## 2022-01-27 DIAGNOSIS — Z9884 Bariatric surgery status: Secondary | ICD-10-CM | POA: Diagnosis not present

## 2022-01-27 DIAGNOSIS — G939 Disorder of brain, unspecified: Secondary | ICD-10-CM | POA: Diagnosis not present

## 2022-01-27 DIAGNOSIS — K5903 Drug induced constipation: Secondary | ICD-10-CM | POA: Diagnosis not present

## 2022-01-27 DIAGNOSIS — N281 Cyst of kidney, acquired: Secondary | ICD-10-CM | POA: Diagnosis not present

## 2022-02-03 ENCOUNTER — Telehealth: Payer: Self-pay | Admitting: Radiation Oncology

## 2022-02-03 ENCOUNTER — Other Ambulatory Visit: Payer: Self-pay | Admitting: Radiation Therapy

## 2022-02-03 NOTE — Telephone Encounter (Signed)
1/30 @ 1:14 pm Left voicemail for patient to call our office to be schedule for consult with Dr. Isidore Moos.

## 2022-02-04 ENCOUNTER — Other Ambulatory Visit: Payer: Self-pay | Admitting: Radiation Oncology

## 2022-02-04 ENCOUNTER — Ambulatory Visit
Admission: RE | Admit: 2022-02-04 | Discharge: 2022-02-04 | Disposition: A | Discharge status: Home / Self Care | Payer: Self-pay | Source: Ambulatory Visit | Attending: Radiation Oncology | Admitting: Radiation Oncology

## 2022-02-04 ENCOUNTER — Telehealth: Payer: Self-pay | Admitting: Radiation Oncology

## 2022-02-04 DIAGNOSIS — G9389 Other specified disorders of brain: Secondary | ICD-10-CM

## 2022-02-04 NOTE — Telephone Encounter (Signed)
1/31 @ 10:55 am Left voicemail with patient and patient's wife number to call our office to be schedule for consult.

## 2022-02-05 ENCOUNTER — Telehealth: Payer: Self-pay | Admitting: Radiation Oncology

## 2022-02-05 NOTE — Progress Notes (Signed)
Location/Histology of Brain Tumor:     Patient presented with symptoms of:      Past or anticipated interventions, if any, per neurosurgery:     Past or anticipated interventions, if any, per medical oncology:    Dose of Decadron, if applicable: None at present  Recent neurologic symptoms, if any:  Seizures: how dx started  Headaches: intermittent stabbing headaches Nausea: none Dizziness/ataxia: yes,  Difficulty with hand coordination: dropping items at home Focal numbness/weakness: none at this time Visual deficits/changes: yes Confusion/Memory deficits: yes, repeats things and often forgets  Painful bone metastases at present, if any: none at this time  SAFETY ISSUES: Prior radiation? none Pacemaker/ICD? none Possible current pregnancy? none Is the patient on methotrexate? none  Additional Complaints / other details: what are the effects, overall treatment plan, is it cancer??   Vitals:   02/10/22 0738  BP: 132/81  Pulse: 67  Resp: 18  Temp: (!) 96.9 F (36.1 C)  SpO2: 99%

## 2022-02-05 NOTE — Telephone Encounter (Signed)
2/1 @ 9:00 am Left voicemail with patient and patient's wife for patient to call our office to be schedule for consult. Spoke to Dr. Lucious Groves nurse so she is aware we made several attempts to get patient schedule with Dr. Isidore Moos.  She will make attempt to reach out to patient to see if patient will answer phone and will called back to confirm.

## 2022-02-08 NOTE — Progress Notes (Signed)
Radiation Oncology         [phone] ________________________________  Initial Outpatient Consultation  Name: [name] [name] MRN: [id number]  Date: 02/10/2022  DOB: 1950-02-27  WH:[id number], Lorrin Jackson, MD   REFERRING PHYSICIAN: Georgina Quint, MD  DIAGNOSIS: C79.31   ICD-10-CM   1. Brain tumor ([place])  D49.6     2. 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   STAGE IV  CHIEF COMPLAINT: Here to discuss management of brain metastases  HISTORY OF PRESENT ILLNESS::[name] [name] is a 72 y.o. male who presents today for consideration of whole brain radiotherapy in management of his recently diagnosed metastatic disease to the brain .  The patient presented to the [place] ED on 01/27/22 due to a seizure like episode. Upon arrival, the patient was loaded with Keppra without any further seizure activity. MRI Brain with and without contrast performed on admission showed multifocal abnormalities of both cerebral hemispheres and multiple ring-enhancing lesions. Differentials noted at the time included glioblastomas vs intracranial metastatic disease. CT CAP also performed showed a 1.7 cm enhancing lesion at the medial wall of the second portion of duodenum which raised possible concern for neoplasm, such as a neuroendocrine tumor. GI was consulted and recommend outpatient EGD for further evaluation of this.   CT of the head performed on 01/30/22 demonstrated: numerous ring-enhancing parenchymal lesions. The largest of which is located in the right parietal lobe, measuring up to 2.3 cm. No substantial intracranial mass effect, or space-occupying intracranial hemorrhage were appreciated. Differential considerations include multicentric glioma and metastatic disease.   Neurosurgery was consulted and the  patient underwent a right parietal craniotomy with biopsies on 01/30/21. Per encounter notes, the preliminary pathology showed findings consistent with small blue cell morphology/neuroendocrine tumor.   Hospital course included empiric steroids, which were tapered to '4mg'$  BID at the time of discharge on 02/02/22. He was also discharged home with Keppra 750 mg to take BID.      The patient was seen in consultation by [facility] oncology while inpatient who have recommended whole brain radiotherapy for a total of 30 Gy delivered in 10 Fx. The patient requested that he receive radiation therapy closer to his home in [place]. Subsequently, he was referred to me here today for consideration.  He reports no seizures since discharge. Has a "brain fog" however. Low grade HAs helped by tylenol.   PREVIOUS RADIATION THERAPY: No  PAST MEDICAL HISTORY:  has a past medical history of Arthritis, Cholelithiasis, Chronic cough, Difficulty breathing, Dyspnea, Full dentures, GERD (gastroesophageal reflux disease), Heart murmur, History of 2019 novel coronavirus disease (COVID-19), Hyperlipidemia, Hypertension, ILD (interstitial lung disease) ([place]), OSA treated with BiPAP, Pulmonary nodule, Type 2 diabetes mellitus ([place]), and Wears glasses.    PAST SURGICAL HISTORY: Past Surgical History:  Procedure Laterality Date   CHOLECYSTECTOMY N/A 05/01/2019   Procedure: LAPAROSCOPIC CHOLECYSTECTOMY;  Surgeon: Kinsinger, Arta Bruce, MD;  Location: [facility];  Service: General;  Laterality: N/A;   COLONOSCOPY  last one 02-07-2016 dr danis   LAPAROSCOPIC ASSISTED VENTRAL HERNIA REPAIR  09-28-2006   '@[facility]'$  and 12-04-2008 for recurrent '@[facility]'$    TONSILLECTOMY  child   UMBILICAL HERNIA REPAIR  09-26-2004   '@[facility]'$    UPPER GI ENDOSCOPY N/A 05/19/2021   Procedure: UPPER GI ENDOSCOPY;  Surgeon: Mickeal Skinner,  MD;  Location: [facility];  Service: General;  Laterality: N/A;    FAMILY HISTORY: family history  includes Cancer in his mother; Heart disease in his brother and father.  SOCIAL HISTORY:  reports that he quit smoking about 24 years ago. His smoking use included cigarettes. He has never used smokeless tobacco. He reports that he does not drink alcohol and does not use drugs.  ALLERGIES: Mercury  MEDICATIONS:  Current Outpatient Medications  Medication Sig Dispense Refill   aspirin EC 81 MG tablet Take 81 mg by mouth daily.     Calcium Citrate-Vitamin D 500-12.5 MG-MCG CHEW Chew 1 tablet by mouth 3 (three) times daily.     Multiple Vitamins-Minerals (BARIATRIC MULTIVITAMINS/IRON PO) Take 1 tablet by mouth daily.     Omega-3 Fatty Acids (SM FISH OIL) 1000 MG CAPS Take 1,000 mg by mouth daily.     polyethylene glycol (MIRALAX / GLYCOLAX) 17 g packet Take 17 g by mouth daily as needed for moderate constipation.     albuterol (PROVENTIL HFA;VENTOLIN HFA) 108 (90 Base) MCG/ACT inhaler Inhale 2 puffs into the lungs every 4 (four) hours as needed for shortness of breath.     Blood Glucose Monitoring Suppl (FIFTY50 GLUCOSE METER 2.0) w/Device KIT Use as instructed (Patient not taking: Reported on 02/10/2022)     Lancets (ACCU-CHEK MULTICLIX) lancets  (Patient not taking: Reported on 02/10/2022)     metFORMIN (GLUCOPHAGE-XR) 500 MG 24 hr tablet Take 1,000 mg by mouth 2 (two) times daily. (Patient not taking: Reported on 02/10/2022)     ondansetron (ZOFRAN-ODT) 4 MG disintegrating tablet Dissolve 1 tablet (4 mg total) by mouth every 6 hours as needed for nausea or vomiting. (Patient not taking: Reported on 02/10/2022) 20 tablet 0   ONETOUCH VERIO test strip USE 1 STRIP TO CHECK GLUCOSE ONCE DAILY FOR BLOOD SUGAR (Patient not taking: Reported on 02/10/2022)  1   pantoprazole (PROTONIX) 20 MG tablet Take 20 mg by mouth daily. (Patient not taking: Reported on 02/10/2022)     No current facility-administered medications for this encounter.    REVIEW OF SYSTEMS:  Notable for that above.   PHYSICAL EXAM:  height is  5' 10.5" (1.791 m) and weight is 248 lb 2 oz (112.5 kg). His temporal temperature is 96.9 F (36.1 C) (abnormal). His blood pressure is 132/81 and his pulse is 67. His respiration is 18 and oxygen saturation is 99%.   General: Alert and oriented, in no acute distress  HEENT: Head is normocephalic. Extraocular movements are intact. Oropharynx is clear. Heart: Regular in rate and rhythm with no murmurs, rubs, or gallops. Chest: Clear to auscultation bilaterally, with no rhonchi, wheezes, or rales. Abdomen: Soft, nontender, nondistended, with no rigidity or guarding. Extremities: No cyanosis; mild LE edema noted. Skin: bruises on forearms Musculoskeletal: symmetric strength and muscle tone throughout. Neurologic: Cranial nerves II through XII are grossly intact. No obvious focalities. Speech is fluent. Coordination is intact. Psychiatric: Judgment and insight are intact. Affect is appropriate.  KPS = 80  100 - Normal; no complaints; no evidence of disease. 90   - Able to carry on normal activity; minor signs or symptoms of disease. 80   - Normal activity with effort; some signs or symptoms of disease. 55   - Cares for self; unable to carry on normal activity or to do active work. 60   - Requires occasional assistance, but is able to care for most of his personal needs. 50   - Requires considerable  assistance and frequent medical care. 19   - Disabled; requires special care and assistance. 67   - Severely disabled; hospital admission is indicated although death not imminent. 65   - Very sick; hospital admission necessary; active supportive treatment necessary. 10   - Moribund; fatal processes progressing rapidly. 0     - Dead  Karnofsky DA, Abelmann [place], Craver LS and Bur[place]al [employer] [facility] [phone]) The use of the nitrogen mustards in the palliative treatment of carcinoma: with particular reference to bronchogenic carcinoma Cancer 1 [id number]    LABORATORY DATA:  Lab Results  Component Value Date   WBC  9.6 05/21/2021   HGB 8.5 (L) 05/21/2021   HCT 25.9 (L) 05/21/2021   MCV 84.9 05/21/2021   PLT 190 05/21/2021   CMP     Component Value Date/Time   NA 135 05/09/2021 1113   K 5.2 (H) 05/09/2021 1113   CL 99 05/09/2021 1113   CO2 24 05/09/2021 1113   GLUCOSE 129 (H) 05/09/2021 1113   BUN 22 05/09/2021 1113   CREATININE 1.25 (H) 05/19/2021 0953   CALCIUM 9.9 05/09/2021 1113   PROT 7.8 05/09/2021 1113   ALBUMIN 4.2 05/09/2021 1113   AST 63 (H) 05/09/2021 1113   ALT 50 (H) 05/09/2021 1113   ALKPHOS 30 (L) 05/09/2021 1113   BILITOT 1.6 (H) 05/09/2021 1113   GFRNONAA >60 05/19/2021 0953   GFRAA  12/14/2008 0350    >60        The eGFR has been calculated using the MDRD equation. This calculation has not been validated in all clinical situations. eGFR's persistently <60 mL/min signify possible Chronic Kidney Disease.        RADIOGRAPHY:  as above, I personally review his images.     IMPRESSION/PLAN: Today, I talked to the patient about the findings and work-up thus far.  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  We discussed the available radiation techniques, and focused on the details of logistics and delivery.     We discussed the risks, benefits, and side effects of whole brain radiotherapy. Side effects may include but not necessarily be limited to: Skin irritation, fatigue, hair loss, inflammation to the brain, injury to the brain, neurocognitive decline, seizures, headaches; no guarantees of treatment were given. A consent form was signed and placed in the patient's medical record. The patient was encouraged to ask questions that I answered to the best of my ability.    We talked about the pros and cons of 30 Gray in 10 fractions versus 35 Gray in 14 fractions.  There may be a slight reduction in the risk of  neurocognitive decline if he received 35 Gray in 14 fractions.  He would like to proceed with that.  We will conduct CT simulation today and start his treatment tomorrow.  He will continue Keppra and dexamethasone as prescribed.   A dexamethasone taper will be facilitated eventually by me or someone on his team, depending on how he does with radiation therapy.  On date of service, in total, I spent 60 minutes on this encounter. Patient was seen in person.  __________________________________________   Eppie Gibson, MD  This document serves as a record of services personally performed by Eppie Gibson, MD. It was created on her behalf by Roney Mans, a trained medical scribe. The creation of this record is based on the scribe's personal  observations and the provider's statements to them. This document has been checked and approved by the attending provider.

## 2022-02-09 ENCOUNTER — Encounter: Payer: Medicare Other | Admitting: Dietician

## 2022-02-09 ENCOUNTER — Inpatient Hospital Stay: Payer: Medicare Other | Attending: Radiation Oncology

## 2022-02-09 ENCOUNTER — Telehealth: Payer: Self-pay

## 2022-02-09 DIAGNOSIS — Z79899 Other long term (current) drug therapy: Secondary | ICD-10-CM | POA: Diagnosis not present

## 2022-02-09 DIAGNOSIS — C7A8 Other malignant neuroendocrine tumors: Secondary | ICD-10-CM | POA: Diagnosis not present

## 2022-02-09 DIAGNOSIS — K3189 Other diseases of stomach and duodenum: Secondary | ICD-10-CM | POA: Diagnosis not present

## 2022-02-09 DIAGNOSIS — C7931 Secondary malignant neoplasm of brain: Secondary | ICD-10-CM | POA: Diagnosis not present

## 2022-02-09 DIAGNOSIS — G40909 Epilepsy, unspecified, not intractable, without status epilepticus: Secondary | ICD-10-CM | POA: Diagnosis not present

## 2022-02-09 NOTE — Telephone Encounter (Signed)
Pt wife called to confirm appointment times and if he needed to be fasting. Rn confirmed times. Rn also stated he did not need to be fasting for ct sim tomorrow. Pt and family understood.

## 2022-02-10 ENCOUNTER — Encounter: Payer: Self-pay | Admitting: Radiation Oncology

## 2022-02-10 ENCOUNTER — Other Ambulatory Visit: Payer: Self-pay

## 2022-02-10 ENCOUNTER — Ambulatory Visit
Admission: RE | Admit: 2022-02-10 | Discharge: 2022-02-10 | Disposition: A | Discharge status: Home / Self Care | Payer: Medicare Other | Source: Ambulatory Visit | Attending: Radiation Oncology | Admitting: Radiation Oncology

## 2022-02-10 VITALS — BP 132/81 | HR 67 | Temp 96.9°F | Resp 18 | Ht 70.5 in | Wt 248.1 lb

## 2022-02-10 DIAGNOSIS — K219 Gastro-esophageal reflux disease without esophagitis: Secondary | ICD-10-CM | POA: Insufficient documentation

## 2022-02-10 DIAGNOSIS — Z8616 Personal history of COVID-19: Secondary | ICD-10-CM | POA: Insufficient documentation

## 2022-02-10 DIAGNOSIS — C7931 Secondary malignant neoplasm of brain: Secondary | ICD-10-CM

## 2022-02-10 DIAGNOSIS — Z79899 Other long term (current) drug therapy: Secondary | ICD-10-CM | POA: Insufficient documentation

## 2022-02-10 DIAGNOSIS — E785 Hyperlipidemia, unspecified: Secondary | ICD-10-CM | POA: Insufficient documentation

## 2022-02-10 DIAGNOSIS — Z809 Family history of malignant neoplasm, unspecified: Secondary | ICD-10-CM | POA: Insufficient documentation

## 2022-02-10 DIAGNOSIS — C7A8 Other malignant neuroendocrine tumors: Secondary | ICD-10-CM | POA: Diagnosis not present

## 2022-02-10 DIAGNOSIS — Z510 Encounter for antineoplastic radiation therapy: Secondary | ICD-10-CM | POA: Diagnosis not present

## 2022-02-10 DIAGNOSIS — E119 Type 2 diabetes mellitus without complications: Secondary | ICD-10-CM | POA: Diagnosis not present

## 2022-02-10 DIAGNOSIS — Z7982 Long term (current) use of aspirin: Secondary | ICD-10-CM | POA: Insufficient documentation

## 2022-02-10 DIAGNOSIS — C801 Malignant (primary) neoplasm, unspecified: Secondary | ICD-10-CM | POA: Insufficient documentation

## 2022-02-10 DIAGNOSIS — R911 Solitary pulmonary nodule: Secondary | ICD-10-CM | POA: Insufficient documentation

## 2022-02-10 DIAGNOSIS — M129 Arthropathy, unspecified: Secondary | ICD-10-CM | POA: Insufficient documentation

## 2022-02-10 DIAGNOSIS — Z87891 Personal history of nicotine dependence: Secondary | ICD-10-CM | POA: Diagnosis not present

## 2022-02-10 DIAGNOSIS — G4730 Sleep apnea, unspecified: Secondary | ICD-10-CM | POA: Diagnosis not present

## 2022-02-10 DIAGNOSIS — Z7984 Long term (current) use of oral hypoglycemic drugs: Secondary | ICD-10-CM | POA: Insufficient documentation

## 2022-02-10 DIAGNOSIS — I10 Essential (primary) hypertension: Secondary | ICD-10-CM | POA: Insufficient documentation

## 2022-02-10 DIAGNOSIS — D496 Neoplasm of unspecified behavior of brain: Secondary | ICD-10-CM

## 2022-02-11 ENCOUNTER — Ambulatory Visit
Admission: RE | Admit: 2022-02-11 | Discharge: 2022-02-11 | Disposition: A | Discharge status: Home / Self Care | Payer: Medicare Other | Source: Ambulatory Visit | Attending: Radiation Oncology | Admitting: Radiation Oncology

## 2022-02-11 ENCOUNTER — Other Ambulatory Visit: Payer: Self-pay

## 2022-02-11 DIAGNOSIS — Z85118 Personal history of other malignant neoplasm of bronchus and lung: Secondary | ICD-10-CM | POA: Diagnosis not present

## 2022-02-11 DIAGNOSIS — R933 Abnormal findings on diagnostic imaging of other parts of digestive tract: Secondary | ICD-10-CM | POA: Diagnosis not present

## 2022-02-11 DIAGNOSIS — Z510 Encounter for antineoplastic radiation therapy: Secondary | ICD-10-CM | POA: Diagnosis not present

## 2022-02-11 DIAGNOSIS — K319 Disease of stomach and duodenum, unspecified: Secondary | ICD-10-CM | POA: Diagnosis not present

## 2022-02-11 DIAGNOSIS — C7931 Secondary malignant neoplasm of brain: Secondary | ICD-10-CM | POA: Diagnosis not present

## 2022-02-11 DIAGNOSIS — C801 Malignant (primary) neoplasm, unspecified: Secondary | ICD-10-CM | POA: Diagnosis not present

## 2022-02-11 LAB — RAD ONC ARIA SESSION SUMMARY
Course Elapsed Days: 0
Plan Fractions Treated to Date: 1
Plan Prescribed Dose Per Fraction: 2.5 Gy
Plan Total Fractions Prescribed: 14
Plan Total Prescribed Dose: 35 Gy
Reference Point Dosage Given to Date: 2.5 Gy
Reference Point Session Dosage Given: 2.5 Gy
Session Number: 1

## 2022-02-12 ENCOUNTER — Ambulatory Visit
Admission: RE | Admit: 2022-02-12 | Discharge: 2022-02-12 | Disposition: A | Discharge status: Home / Self Care | Payer: Medicare Other | Source: Ambulatory Visit | Attending: Radiation Oncology | Admitting: Radiation Oncology

## 2022-02-12 ENCOUNTER — Other Ambulatory Visit: Payer: Self-pay

## 2022-02-12 DIAGNOSIS — Z510 Encounter for antineoplastic radiation therapy: Secondary | ICD-10-CM | POA: Diagnosis not present

## 2022-02-12 DIAGNOSIS — C7931 Secondary malignant neoplasm of brain: Secondary | ICD-10-CM | POA: Diagnosis not present

## 2022-02-12 DIAGNOSIS — C801 Malignant (primary) neoplasm, unspecified: Secondary | ICD-10-CM | POA: Diagnosis not present

## 2022-02-12 LAB — RAD ONC ARIA SESSION SUMMARY
Course Elapsed Days: 1
Plan Fractions Treated to Date: 2
Plan Prescribed Dose Per Fraction: 2.5 Gy
Plan Total Fractions Prescribed: 14
Plan Total Prescribed Dose: 35 Gy
Reference Point Dosage Given to Date: 5 Gy
Reference Point Session Dosage Given: 2.5 Gy
Session Number: 2

## 2022-02-13 ENCOUNTER — Ambulatory Visit
Admission: RE | Admit: 2022-02-13 | Discharge: 2022-02-13 | Disposition: A | Discharge status: Home / Self Care | Payer: Medicare Other | Source: Ambulatory Visit | Attending: Radiation Oncology | Admitting: Radiation Oncology

## 2022-02-13 ENCOUNTER — Other Ambulatory Visit: Payer: Self-pay

## 2022-02-13 DIAGNOSIS — C719 Malignant neoplasm of brain, unspecified: Secondary | ICD-10-CM | POA: Diagnosis not present

## 2022-02-13 DIAGNOSIS — K3189 Other diseases of stomach and duodenum: Secondary | ICD-10-CM | POA: Diagnosis not present

## 2022-02-13 DIAGNOSIS — Z8669 Personal history of other diseases of the nervous system and sense organs: Secondary | ICD-10-CM | POA: Diagnosis not present

## 2022-02-13 DIAGNOSIS — Z510 Encounter for antineoplastic radiation therapy: Secondary | ICD-10-CM | POA: Diagnosis not present

## 2022-02-13 DIAGNOSIS — C7931 Secondary malignant neoplasm of brain: Secondary | ICD-10-CM | POA: Diagnosis not present

## 2022-02-13 DIAGNOSIS — R569 Unspecified convulsions: Secondary | ICD-10-CM | POA: Diagnosis not present

## 2022-02-13 DIAGNOSIS — C801 Malignant (primary) neoplasm, unspecified: Secondary | ICD-10-CM | POA: Diagnosis not present

## 2022-02-13 DIAGNOSIS — Z79899 Other long term (current) drug therapy: Secondary | ICD-10-CM | POA: Diagnosis not present

## 2022-02-13 LAB — RAD ONC ARIA SESSION SUMMARY
Course Elapsed Days: 2
Plan Fractions Treated to Date: 3
Plan Prescribed Dose Per Fraction: 2.5 Gy
Plan Total Fractions Prescribed: 14
Plan Total Prescribed Dose: 35 Gy
Reference Point Dosage Given to Date: 7.5 Gy
Reference Point Session Dosage Given: 2.5 Gy
Session Number: 3

## 2022-02-16 ENCOUNTER — Encounter: Payer: Medicare Other | Attending: General Surgery | Admitting: Dietician

## 2022-02-16 ENCOUNTER — Ambulatory Visit
Admission: RE | Admit: 2022-02-16 | Discharge: 2022-02-16 | Disposition: A | Discharge status: Home / Self Care | Payer: Medicare Other | Source: Ambulatory Visit | Attending: Radiation Oncology | Admitting: Radiation Oncology

## 2022-02-16 ENCOUNTER — Other Ambulatory Visit: Payer: Self-pay

## 2022-02-16 ENCOUNTER — Encounter: Payer: Self-pay | Admitting: Dietician

## 2022-02-16 VITALS — Ht 70.0 in | Wt 239.3 lb

## 2022-02-16 DIAGNOSIS — E669 Obesity, unspecified: Secondary | ICD-10-CM | POA: Diagnosis not present

## 2022-02-16 DIAGNOSIS — E119 Type 2 diabetes mellitus without complications: Secondary | ICD-10-CM | POA: Insufficient documentation

## 2022-02-16 DIAGNOSIS — C801 Malignant (primary) neoplasm, unspecified: Secondary | ICD-10-CM | POA: Diagnosis not present

## 2022-02-16 DIAGNOSIS — Z510 Encounter for antineoplastic radiation therapy: Secondary | ICD-10-CM | POA: Diagnosis not present

## 2022-02-16 DIAGNOSIS — C7931 Secondary malignant neoplasm of brain: Secondary | ICD-10-CM | POA: Diagnosis not present

## 2022-02-16 DIAGNOSIS — Z794 Long term (current) use of insulin: Secondary | ICD-10-CM | POA: Insufficient documentation

## 2022-02-16 LAB — RAD ONC ARIA SESSION SUMMARY
Course Elapsed Days: 5
Plan Fractions Treated to Date: 4
Plan Prescribed Dose Per Fraction: 2.5 Gy
Plan Total Fractions Prescribed: 14
Plan Total Prescribed Dose: 35 Gy
Reference Point Dosage Given to Date: 10 Gy
Reference Point Session Dosage Given: 2.5 Gy
Session Number: 4

## 2022-02-16 NOTE — Progress Notes (Signed)
Bariatric Nutrition Follow-Up Visit Medical Nutrition Therapy  Appt Start Time: 1:50  End Time: 2:19  Surgery date: 05/19/2021 Surgery type: Sleeve Gastrectomy   NUTRITION ASSESSMENT   Anthropometrics  Start weight at [facility]: 324.7 lbs (date: 10/29/2020)  Weight: 239.3 pounds BMI: 34.34 kg/m2     Clinical  Medical hx: Asthma, diabetes, GERD, COPD, hyperlipidemia, HTN Medications: Zofran Labs: Hemoglobin 8.5; RBC 3.05; HCT 25.9 Notable signs/symptoms: winded easier since covid in 2020 Any previous deficiencies? Iron, vitamin D   Body Composition Scale 06/03/2021 07/15/2021 11/13/2021 02/16/2022  Current Body Weight 299.7 279.1 253.3 239.3  Total Body Fat % 37.9 35.9 33.0 32.0  Visceral Fat 35 30 25 23  $ Fat-Free Mass % 62.0 64.0 66.9 67.9   Total Body Water % 43.0 45.0 47.9 48.9  Muscle-Mass lbs 56.5 52.6 47.7 45.0  BMI 42.1 39.2 35.5 34.3  Body Fat Displacement             Torso  lbs 70.6 62.2 51.9 47.4         Left Leg  lbs 14.1 12.4 10.3 9.4         Right Leg  lbs 14.1 12.4 10.3 9.4         Left Arm  lbs 7.0 6.2 5.1 4.7         Right Arm   lbs 7.0 6.2 5.1 4.7     Lifestyle & Dietary Hx  Pt arrived with wife, stating that pt has been diagnosed with brain cancer with confident of a positive prognosis.  Pt states he has legions in his brain. Pt states he is in a fog, stating he may walk into a wall because he did not see it. Pt was supposed to have knee surgery, until diagnosis of cancer. Pt states diabetes and blood pressure medication are discontinued, stating he is on steroids and will be taking zofran for the cancer and treatments..  Estimated daily fluid intake: 64 oz Estimated daily protein intake: 80 g Supplements: multi vitamin and calcium, fish oil Current average weekly physical activity: walking once a day, not very far, stating he gets winded.  24-Hr Dietary Recall First Meal: 2 pieces of bacon and eggs and toast (whole grain) Snack: nuts  Second Meal: beef  patty with cheese  Snack: cheese stick  Third Meal: meat loaf or chicken Snack: nuts Beverages: water, coffee  Post-Op Goals/ Signs/ Symptoms Using straws: no Drinking while eating: no Chewing/swallowing difficulties: no Changes in vision: no Changes to mood/headaches: no Hair loss/changes to skin/nails: no Difficulty focusing/concentrating: no Sweating: no Limb weakness: no Dizziness/lightheadedness: no Palpitations: no  Carbonated/caffeinated beverages: no N/V/D/C/Gas: no Abdominal pain: no Dumping syndrome: no    NUTRITION DIAGNOSIS  Overweight/obesity (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     NUTRITION INTERVENTION Nutrition counseling (C-1) and education (E-2) to facilitate bariatric surgery goals, including: Diet advancement to the next phase (phase 5) now including starchy vegetables  The importance of consuming adequate calories as well as certain nutrients daily due to the body's need for essential vitamins, minerals, and fats The importance of daily physical activity and to reach a goal of at least 150 minutes of moderate to vigorous physical activity weekly (or as directed by their physician) due to benefits such as increased musculature and improved lab values The importance of intuitive eating specifically learning hunger-satiety cues and understanding the importance  of learning a new body: The importance of mindful eating to avoid grazing behaviors  Encouraged patient to honor their body's internal hunger and fullness cues.  Throughout the day, check in mentally and rate hunger. Stop eating when satisfied not full regardless of how much food is left on the plate.  Get more if still hungry 20-30 minutes later.  The key is to honor satisfaction so throughout the meal, rate fullness factor and stop when  comfortably satisfied not physically full. The key is to honor hunger and fullness without any feelings of guilt or shame.  Pay attention to what the internal cues are, rather than any external factors. This will enhance the confidence you have in listening to your own body and following those internal cues enabling you to increase how often you eat when you are hungry not out of appetite and stop when you are satisfied not full.  Encouraged pt to continue to eat balanced meals inclusive of non starchy vegetables 2 times a day 7 days a week Encouraged pt to choose lean protein sources: limiting beef, pork, sausage, hotdogs, and lunch meat Encourage pt to choose healthy fats such as plant based limiting animal fats Encouraged pt to continue to drink a minium 64 fluid ounces with half being plain water to satisfy proper hydration    Goals -Re-engage/Continue: Resistance exercises to slow/eliminate muscle loss with your weight loss.  Handouts Provided Include  Bariatric MyPlate  Learning Style & Readiness for Change Teaching method utilized: Visual & Auditory  Demonstrated degree of understanding via: Teach Back  Readiness Level: action Barriers to learning/adherence to lifestyle change: non identified  RD's Notes for Next Visit Assess adherence to pt chosen goals   MONITORING & EVALUATION Dietary intake, weekly physical activity, body weight.  Next Steps Patient is to follow-up in May for one year follow-up.

## 2022-02-16 NOTE — Radiation Completion Notes (Signed)
Patient Name: [name] [name], [name] [name] MRN: [id number] Date of Birth: 1950-04-09 Referring Physician: Cyndi Bender, M.D. Date of Service: 2022-02-16 Radiation Oncologist: Eppie Gibson, M.D. [place]                             RADIATION ONCOLOGY END OF TREATMENT NOTE     Diagnosis: C79.31 Secondary malignant neoplasm of brain Intent: Palliative     ==========DELIVERED PLANS==========  First Treatment Date: 2022-02-11 - Last Treatment Date: 2022-02-16   Plan Name: Brain Site: Brain Technique: Isodose Plan Mode: Photon Dose Per Fraction: 2.5 Gy Prescribed Dose (Delivered / Prescribed): 10 Gy / 10 Gy Prescribed Fxs (Delivered / Prescribed): 4 / 4     ==========ON TREATMENT VISIT DATES========== 2022-02-16     ==========UPCOMING VISITS==========       ==========APPENDIX - ON TREATMENT VISIT NOTES==========   See weekly On Treatment Notes is Epic for details.

## 2022-02-17 ENCOUNTER — Other Ambulatory Visit: Payer: Self-pay

## 2022-02-17 ENCOUNTER — Ambulatory Visit
Admission: RE | Admit: 2022-02-17 | Discharge: 2022-02-17 | Disposition: A | Discharge status: Home / Self Care | Payer: Medicare Other | Source: Ambulatory Visit | Attending: Radiation Oncology | Admitting: Radiation Oncology

## 2022-02-17 DIAGNOSIS — C7931 Secondary malignant neoplasm of brain: Secondary | ICD-10-CM | POA: Diagnosis not present

## 2022-02-17 DIAGNOSIS — Z510 Encounter for antineoplastic radiation therapy: Secondary | ICD-10-CM | POA: Diagnosis not present

## 2022-02-17 DIAGNOSIS — Z483 Aftercare following surgery for neoplasm: Secondary | ICD-10-CM | POA: Diagnosis not present

## 2022-02-17 DIAGNOSIS — C801 Malignant (primary) neoplasm, unspecified: Secondary | ICD-10-CM | POA: Diagnosis not present

## 2022-02-17 DIAGNOSIS — C7A8 Other malignant neuroendocrine tumors: Secondary | ICD-10-CM | POA: Diagnosis not present

## 2022-02-17 DIAGNOSIS — C719 Malignant neoplasm of brain, unspecified: Secondary | ICD-10-CM | POA: Diagnosis not present

## 2022-02-17 LAB — RAD ONC ARIA SESSION SUMMARY
Course Elapsed Days: 6
Plan Fractions Treated to Date: 1
Plan Prescribed Dose Per Fraction: 2.5 Gy
Plan Total Fractions Prescribed: 12
Plan Total Prescribed Dose: 30 Gy
Reference Point Dosage Given to Date: 2.5 Gy
Reference Point Session Dosage Given: 2.5 Gy
Session Number: 5

## 2022-02-18 ENCOUNTER — Other Ambulatory Visit: Payer: Self-pay

## 2022-02-18 ENCOUNTER — Ambulatory Visit
Admission: RE | Admit: 2022-02-18 | Discharge: 2022-02-18 | Disposition: A | Discharge status: Home / Self Care | Payer: Medicare Other | Source: Ambulatory Visit | Attending: Radiation Oncology | Admitting: Radiation Oncology

## 2022-02-18 DIAGNOSIS — C7A8 Other malignant neuroendocrine tumors: Secondary | ICD-10-CM | POA: Diagnosis not present

## 2022-02-18 DIAGNOSIS — C801 Malignant (primary) neoplasm, unspecified: Secondary | ICD-10-CM | POA: Diagnosis not present

## 2022-02-18 DIAGNOSIS — C719 Malignant neoplasm of brain, unspecified: Secondary | ICD-10-CM | POA: Diagnosis not present

## 2022-02-18 DIAGNOSIS — Z510 Encounter for antineoplastic radiation therapy: Secondary | ICD-10-CM | POA: Diagnosis not present

## 2022-02-18 DIAGNOSIS — K319 Disease of stomach and duodenum, unspecified: Secondary | ICD-10-CM | POA: Diagnosis not present

## 2022-02-18 DIAGNOSIS — C7931 Secondary malignant neoplasm of brain: Secondary | ICD-10-CM | POA: Diagnosis not present

## 2022-02-18 DIAGNOSIS — R569 Unspecified convulsions: Secondary | ICD-10-CM | POA: Diagnosis not present

## 2022-02-18 LAB — RAD ONC ARIA SESSION SUMMARY
Course Elapsed Days: 7
Plan Fractions Treated to Date: 2
Plan Prescribed Dose Per Fraction: 2.5 Gy
Plan Total Fractions Prescribed: 12
Plan Total Prescribed Dose: 30 Gy
Reference Point Dosage Given to Date: 5 Gy
Reference Point Session Dosage Given: 2.5 Gy
Session Number: 6

## 2022-02-19 ENCOUNTER — Ambulatory Visit
Admission: RE | Admit: 2022-02-19 | Discharge: 2022-02-19 | Disposition: A | Discharge status: Home / Self Care | Payer: Medicare Other | Source: Ambulatory Visit | Attending: Radiation Oncology | Admitting: Radiation Oncology

## 2022-02-19 ENCOUNTER — Other Ambulatory Visit: Payer: Self-pay

## 2022-02-19 DIAGNOSIS — Z510 Encounter for antineoplastic radiation therapy: Secondary | ICD-10-CM | POA: Diagnosis not present

## 2022-02-19 DIAGNOSIS — C801 Malignant (primary) neoplasm, unspecified: Secondary | ICD-10-CM | POA: Diagnosis not present

## 2022-02-19 DIAGNOSIS — C7931 Secondary malignant neoplasm of brain: Secondary | ICD-10-CM | POA: Diagnosis not present

## 2022-02-19 DIAGNOSIS — C7A8 Other malignant neuroendocrine tumors: Secondary | ICD-10-CM | POA: Diagnosis not present

## 2022-02-19 LAB — RAD ONC ARIA SESSION SUMMARY
Course Elapsed Days: 8
Plan Fractions Treated to Date: 3
Plan Prescribed Dose Per Fraction: 2.5 Gy
Plan Total Fractions Prescribed: 12
Plan Total Prescribed Dose: 30 Gy
Reference Point Dosage Given to Date: 7.5 Gy
Reference Point Session Dosage Given: 2.5 Gy
Session Number: 7

## 2022-02-20 ENCOUNTER — Ambulatory Visit
Admission: RE | Admit: 2022-02-20 | Discharge: 2022-02-20 | Disposition: A | Discharge status: Home / Self Care | Payer: Medicare Other | Source: Ambulatory Visit | Attending: Radiation Oncology | Admitting: Radiation Oncology

## 2022-02-20 ENCOUNTER — Other Ambulatory Visit: Payer: Self-pay

## 2022-02-20 DIAGNOSIS — C7931 Secondary malignant neoplasm of brain: Secondary | ICD-10-CM

## 2022-02-20 DIAGNOSIS — C801 Malignant (primary) neoplasm, unspecified: Secondary | ICD-10-CM | POA: Diagnosis not present

## 2022-02-20 DIAGNOSIS — Z510 Encounter for antineoplastic radiation therapy: Secondary | ICD-10-CM | POA: Diagnosis not present

## 2022-02-20 DIAGNOSIS — C7A8 Other malignant neuroendocrine tumors: Secondary | ICD-10-CM | POA: Diagnosis not present

## 2022-02-20 LAB — RAD ONC ARIA SESSION SUMMARY
Course Elapsed Days: 9
Plan Fractions Treated to Date: 4
Plan Prescribed Dose Per Fraction: 2.5 Gy
Plan Total Fractions Prescribed: 12
Plan Total Prescribed Dose: 30 Gy
Reference Point Dosage Given to Date: 10 Gy
Reference Point Session Dosage Given: 2.5 Gy
Session Number: 8

## 2022-02-20 LAB — CMP (CANCER CENTER ONLY)
ALT: 32 U/L (ref 0–44)
AST: 16 U/L (ref 15–41)
Albumin: 4.1 g/dL (ref 3.5–5.0)
Alkaline Phosphatase: 35 U/L — ABNORMAL LOW (ref 38–126)
Anion gap: 6 (ref 5–15)
BUN: 22 mg/dL (ref 8–23)
CO2: 29 mmol/L (ref 22–32)
Calcium: 9.7 mg/dL (ref 8.9–10.3)
Chloride: 104 mmol/L (ref 98–111)
Creatinine: 0.95 mg/dL (ref 0.61–1.24)
GFR, Estimated: >60 mL/min
Glucose, Bld: 139 mg/dL — ABNORMAL HIGH (ref 70–99)
Potassium: 4.3 mmol/L (ref 3.5–5.1)
Sodium: 139 mmol/L (ref 135–145)
Total Bilirubin: 1.5 mg/dL — ABNORMAL HIGH (ref 0.3–1.2)
Total Protein: 6.7 g/dL (ref 6.5–8.1)

## 2022-02-20 LAB — CBC WITH DIFFERENTIAL (CANCER CENTER ONLY)
Abs Immature Granulocytes: 0.04 K/uL (ref 0.00–0.07)
Basophils Absolute: 0 K/uL (ref 0.0–0.1)
Basophils Relative: 0 %
Eosinophils Absolute: 0 K/uL (ref 0.0–0.5)
Eosinophils Relative: 0 %
HCT: 41.3 % (ref 39.0–52.0)
Hemoglobin: 14 g/dL (ref 13.0–17.0)
Immature Granulocytes: 1 %
Lymphocytes Relative: 19 %
Lymphs Abs: 1.6 K/uL (ref 0.7–4.0)
MCH: 28.5 pg (ref 26.0–34.0)
MCHC: 33.9 g/dL (ref 30.0–36.0)
MCV: 84.1 fL (ref 80.0–100.0)
Monocytes Absolute: 0.4 K/uL (ref 0.1–1.0)
Monocytes Relative: 4 %
Neutro Abs: 6.7 K/uL (ref 1.7–7.7)
Neutrophils Relative %: 76 %
Platelet Count: 183 K/uL (ref 150–400)
RBC: 4.91 MIL/uL (ref 4.22–5.81)
RDW: 15.4 % (ref 11.5–15.5)
WBC Count: 8.8 K/uL (ref 4.0–10.5)
nRBC: 0 % (ref 0.0–0.2)

## 2022-02-23 ENCOUNTER — Other Ambulatory Visit: Payer: Self-pay

## 2022-02-23 ENCOUNTER — Ambulatory Visit
Admission: RE | Admit: 2022-02-23 | Discharge: 2022-02-23 | Disposition: A | Discharge status: Home / Self Care | Payer: Medicare Other | Source: Ambulatory Visit | Attending: Radiation Oncology | Admitting: Radiation Oncology

## 2022-02-23 DIAGNOSIS — C7931 Secondary malignant neoplasm of brain: Secondary | ICD-10-CM | POA: Diagnosis not present

## 2022-02-23 DIAGNOSIS — C7A8 Other malignant neuroendocrine tumors: Secondary | ICD-10-CM | POA: Diagnosis not present

## 2022-02-23 DIAGNOSIS — C801 Malignant (primary) neoplasm, unspecified: Secondary | ICD-10-CM | POA: Diagnosis not present

## 2022-02-23 DIAGNOSIS — Z510 Encounter for antineoplastic radiation therapy: Secondary | ICD-10-CM | POA: Diagnosis not present

## 2022-02-23 LAB — RAD ONC ARIA SESSION SUMMARY
Course Elapsed Days: 12
Plan Fractions Treated to Date: 5
Plan Prescribed Dose Per Fraction: 2.5 Gy
Plan Total Fractions Prescribed: 12
Plan Total Prescribed Dose: 30 Gy
Reference Point Dosage Given to Date: 12.5 Gy
Reference Point Session Dosage Given: 2.5 Gy
Session Number: 9

## 2022-02-24 ENCOUNTER — Other Ambulatory Visit: Payer: Self-pay

## 2022-02-24 ENCOUNTER — Ambulatory Visit
Admission: RE | Admit: 2022-02-24 | Discharge: 2022-02-24 | Disposition: A | Discharge status: Home / Self Care | Payer: Medicare Other | Source: Ambulatory Visit | Attending: Radiation Oncology | Admitting: Radiation Oncology

## 2022-02-24 DIAGNOSIS — C7A8 Other malignant neuroendocrine tumors: Secondary | ICD-10-CM | POA: Diagnosis not present

## 2022-02-24 DIAGNOSIS — C801 Malignant (primary) neoplasm, unspecified: Secondary | ICD-10-CM | POA: Diagnosis not present

## 2022-02-24 DIAGNOSIS — Z510 Encounter for antineoplastic radiation therapy: Secondary | ICD-10-CM | POA: Diagnosis not present

## 2022-02-24 DIAGNOSIS — C7931 Secondary malignant neoplasm of brain: Secondary | ICD-10-CM | POA: Diagnosis not present

## 2022-02-24 LAB — RAD ONC ARIA SESSION SUMMARY
Course Elapsed Days: 13
Plan Fractions Treated to Date: 6
Plan Prescribed Dose Per Fraction: 2.5 Gy
Plan Total Fractions Prescribed: 12
Plan Total Prescribed Dose: 30 Gy
Reference Point Dosage Given to Date: 15 Gy
Reference Point Session Dosage Given: 2.5 Gy
Session Number: 10

## 2022-02-25 ENCOUNTER — Other Ambulatory Visit: Payer: Self-pay

## 2022-02-25 ENCOUNTER — Ambulatory Visit
Admission: RE | Admit: 2022-02-25 | Discharge: 2022-02-25 | Disposition: A | Discharge status: Home / Self Care | Payer: Medicare Other | Source: Ambulatory Visit | Attending: Radiation Oncology | Admitting: Radiation Oncology

## 2022-02-25 DIAGNOSIS — C7931 Secondary malignant neoplasm of brain: Secondary | ICD-10-CM | POA: Diagnosis not present

## 2022-02-25 DIAGNOSIS — Z510 Encounter for antineoplastic radiation therapy: Secondary | ICD-10-CM | POA: Diagnosis not present

## 2022-02-25 DIAGNOSIS — C7A8 Other malignant neuroendocrine tumors: Secondary | ICD-10-CM | POA: Diagnosis not present

## 2022-02-25 DIAGNOSIS — C801 Malignant (primary) neoplasm, unspecified: Secondary | ICD-10-CM | POA: Diagnosis not present

## 2022-02-25 LAB — RAD ONC ARIA SESSION SUMMARY
Course Elapsed Days: 14
Plan Fractions Treated to Date: 7
Plan Prescribed Dose Per Fraction: 2.5 Gy
Plan Total Fractions Prescribed: 12
Plan Total Prescribed Dose: 30 Gy
Reference Point Dosage Given to Date: 17.5 Gy
Reference Point Session Dosage Given: 2.5 Gy
Session Number: 11

## 2022-02-26 ENCOUNTER — Other Ambulatory Visit: Payer: Self-pay

## 2022-02-26 ENCOUNTER — Ambulatory Visit
Admission: RE | Admit: 2022-02-26 | Discharge: 2022-02-26 | Disposition: A | Discharge status: Home / Self Care | Payer: Medicare Other | Source: Ambulatory Visit | Attending: Radiation Oncology | Admitting: Radiation Oncology

## 2022-02-26 DIAGNOSIS — C801 Malignant (primary) neoplasm, unspecified: Secondary | ICD-10-CM | POA: Diagnosis not present

## 2022-02-26 DIAGNOSIS — C7931 Secondary malignant neoplasm of brain: Secondary | ICD-10-CM | POA: Diagnosis not present

## 2022-02-26 DIAGNOSIS — C7A8 Other malignant neuroendocrine tumors: Secondary | ICD-10-CM | POA: Diagnosis not present

## 2022-02-26 DIAGNOSIS — Z510 Encounter for antineoplastic radiation therapy: Secondary | ICD-10-CM | POA: Diagnosis not present

## 2022-02-26 LAB — RAD ONC ARIA SESSION SUMMARY
Course Elapsed Days: 15
Plan Fractions Treated to Date: 8
Plan Prescribed Dose Per Fraction: 2.5 Gy
Plan Total Fractions Prescribed: 12
Plan Total Prescribed Dose: 30 Gy
Reference Point Dosage Given to Date: 20 Gy
Reference Point Session Dosage Given: 2.5 Gy
Session Number: 12

## 2022-02-27 ENCOUNTER — Other Ambulatory Visit: Payer: Self-pay

## 2022-02-27 ENCOUNTER — Ambulatory Visit
Admission: RE | Admit: 2022-02-27 | Discharge: 2022-02-27 | Disposition: A | Discharge status: Home / Self Care | Payer: Medicare Other | Source: Ambulatory Visit | Attending: Radiation Oncology | Admitting: Radiation Oncology

## 2022-02-27 DIAGNOSIS — C7931 Secondary malignant neoplasm of brain: Secondary | ICD-10-CM | POA: Diagnosis not present

## 2022-02-27 DIAGNOSIS — Z510 Encounter for antineoplastic radiation therapy: Secondary | ICD-10-CM | POA: Diagnosis not present

## 2022-02-27 DIAGNOSIS — C7A8 Other malignant neuroendocrine tumors: Secondary | ICD-10-CM | POA: Diagnosis not present

## 2022-02-27 DIAGNOSIS — C801 Malignant (primary) neoplasm, unspecified: Secondary | ICD-10-CM | POA: Diagnosis not present

## 2022-02-27 LAB — RAD ONC ARIA SESSION SUMMARY
Course Elapsed Days: 16
Plan Fractions Treated to Date: 9
Plan Prescribed Dose Per Fraction: 2.5 Gy
Plan Total Fractions Prescribed: 12
Plan Total Prescribed Dose: 30 Gy
Reference Point Dosage Given to Date: 22.5 Gy
Reference Point Session Dosage Given: 2.5 Gy
Session Number: 13

## 2022-03-02 ENCOUNTER — Other Ambulatory Visit: Payer: Self-pay

## 2022-03-02 ENCOUNTER — Ambulatory Visit
Admission: RE | Admit: 2022-03-02 | Discharge: 2022-03-02 | Disposition: A | Discharge status: Home / Self Care | Payer: Medicare Other | Source: Ambulatory Visit | Attending: Radiation Oncology | Admitting: Radiation Oncology

## 2022-03-02 ENCOUNTER — Other Ambulatory Visit: Payer: Self-pay | Admitting: Radiation Oncology

## 2022-03-02 DIAGNOSIS — Z510 Encounter for antineoplastic radiation therapy: Secondary | ICD-10-CM | POA: Diagnosis not present

## 2022-03-02 DIAGNOSIS — C7931 Secondary malignant neoplasm of brain: Secondary | ICD-10-CM | POA: Diagnosis not present

## 2022-03-02 DIAGNOSIS — C801 Malignant (primary) neoplasm, unspecified: Secondary | ICD-10-CM | POA: Diagnosis not present

## 2022-03-02 DIAGNOSIS — C7A8 Other malignant neuroendocrine tumors: Secondary | ICD-10-CM | POA: Diagnosis not present

## 2022-03-02 LAB — RAD ONC ARIA SESSION SUMMARY
Course Elapsed Days: 19
Plan Fractions Treated to Date: 10
Plan Prescribed Dose Per Fraction: 2.5 Gy
Plan Total Fractions Prescribed: 12
Plan Total Prescribed Dose: 30 Gy
Reference Point Dosage Given to Date: 25 Gy
Reference Point Session Dosage Given: 2.5 Gy
Session Number: 14

## 2022-03-03 ENCOUNTER — Ambulatory Visit
Admission: RE | Admit: 2022-03-03 | Discharge: 2022-03-03 | Disposition: A | Discharge status: Home / Self Care | Payer: Medicare Other | Source: Ambulatory Visit | Attending: Radiation Oncology | Admitting: Radiation Oncology

## 2022-03-03 ENCOUNTER — Other Ambulatory Visit: Payer: Self-pay

## 2022-03-03 DIAGNOSIS — Z510 Encounter for antineoplastic radiation therapy: Secondary | ICD-10-CM | POA: Diagnosis not present

## 2022-03-03 DIAGNOSIS — C801 Malignant (primary) neoplasm, unspecified: Secondary | ICD-10-CM | POA: Diagnosis not present

## 2022-03-03 DIAGNOSIS — C7A8 Other malignant neuroendocrine tumors: Secondary | ICD-10-CM | POA: Diagnosis not present

## 2022-03-03 DIAGNOSIS — C7931 Secondary malignant neoplasm of brain: Secondary | ICD-10-CM | POA: Diagnosis not present

## 2022-03-03 LAB — RAD ONC ARIA SESSION SUMMARY
Course Elapsed Days: 20
Plan Fractions Treated to Date: 11
Plan Prescribed Dose Per Fraction: 2.5 Gy
Plan Total Fractions Prescribed: 12
Plan Total Prescribed Dose: 30 Gy
Reference Point Dosage Given to Date: 27.5 Gy
Reference Point Session Dosage Given: 2.5 Gy
Session Number: 15

## 2022-03-04 ENCOUNTER — Ambulatory Visit: Payer: Medicare Other

## 2022-03-04 ENCOUNTER — Other Ambulatory Visit: Payer: Self-pay

## 2022-03-04 ENCOUNTER — Ambulatory Visit
Admission: RE | Admit: 2022-03-04 | Discharge: 2022-03-04 | Disposition: A | Discharge status: Home / Self Care | Payer: Medicare Other | Source: Ambulatory Visit | Attending: Radiation Oncology | Admitting: Radiation Oncology

## 2022-03-04 ENCOUNTER — Telehealth: Payer: Self-pay

## 2022-03-04 DIAGNOSIS — C7A8 Other malignant neuroendocrine tumors: Secondary | ICD-10-CM | POA: Diagnosis not present

## 2022-03-04 DIAGNOSIS — C719 Malignant neoplasm of brain, unspecified: Secondary | ICD-10-CM | POA: Diagnosis not present

## 2022-03-04 DIAGNOSIS — C7931 Secondary malignant neoplasm of brain: Secondary | ICD-10-CM

## 2022-03-04 DIAGNOSIS — R569 Unspecified convulsions: Secondary | ICD-10-CM | POA: Diagnosis not present

## 2022-03-04 DIAGNOSIS — Z510 Encounter for antineoplastic radiation therapy: Secondary | ICD-10-CM | POA: Diagnosis not present

## 2022-03-04 DIAGNOSIS — C801 Malignant (primary) neoplasm, unspecified: Secondary | ICD-10-CM | POA: Diagnosis not present

## 2022-03-04 DIAGNOSIS — D496 Neoplasm of unspecified behavior of brain: Secondary | ICD-10-CM

## 2022-03-04 DIAGNOSIS — K319 Disease of stomach and duodenum, unspecified: Secondary | ICD-10-CM | POA: Diagnosis not present

## 2022-03-04 LAB — CBC WITH DIFFERENTIAL (CANCER CENTER ONLY)
Abs Immature Granulocytes: 0.09 10*3/uL — ABNORMAL HIGH (ref 0.00–0.07)
Basophils Absolute: 0 10*3/uL (ref 0.0–0.1)
Basophils Relative: 0 %
Eosinophils Absolute: 0.1 10*3/uL (ref 0.0–0.5)
Eosinophils Relative: 1 %
HCT: 38.6 % — ABNORMAL LOW (ref 39.0–52.0)
Hemoglobin: 13.2 g/dL (ref 13.0–17.0)
Immature Granulocytes: 1 %
Lymphocytes Relative: 13 %
Lymphs Abs: 1 10*3/uL (ref 0.7–4.0)
MCH: 29 pg (ref 26.0–34.0)
MCHC: 34.2 g/dL (ref 30.0–36.0)
MCV: 84.8 fL (ref 80.0–100.0)
Monocytes Absolute: 0.5 10*3/uL (ref 0.1–1.0)
Monocytes Relative: 6 %
Neutro Abs: 5.8 10*3/uL (ref 1.7–7.7)
Neutrophils Relative %: 79 %
Platelet Count: 183 10*3/uL (ref 150–400)
RBC: 4.55 MIL/uL (ref 4.22–5.81)
RDW: 15.7 % — ABNORMAL HIGH (ref 11.5–15.5)
WBC Count: 7.4 10*3/uL (ref 4.0–10.5)
nRBC: 0 % (ref 0.0–0.2)

## 2022-03-04 LAB — RAD ONC ARIA SESSION SUMMARY
Course Elapsed Days: 21
Plan Fractions Treated to Date: 12
Plan Prescribed Dose Per Fraction: 2.5 Gy
Plan Total Fractions Prescribed: 12
Plan Total Prescribed Dose: 30 Gy
Reference Point Dosage Given to Date: 30 Gy
Reference Point Session Dosage Given: 2.5 Gy
Session Number: 16

## 2022-03-04 LAB — CMP (CANCER CENTER ONLY)
ALT: 26 U/L (ref 0–44)
AST: 16 U/L (ref 15–41)
Albumin: 3.8 g/dL (ref 3.5–5.0)
Alkaline Phosphatase: 33 U/L — ABNORMAL LOW (ref 38–126)
Anion gap: 6 (ref 5–15)
BUN: 21 mg/dL (ref 8–23)
CO2: 30 mmol/L (ref 22–32)
Calcium: 9 mg/dL (ref 8.9–10.3)
Chloride: 104 mmol/L (ref 98–111)
Creatinine: 0.92 mg/dL (ref 0.61–1.24)
GFR, Estimated: >60 mL/min (ref >60)
Glucose, Bld: 138 mg/dL — ABNORMAL HIGH (ref 70–99)
Potassium: 4.1 mmol/L (ref 3.5–5.1)
Sodium: 140 mmol/L (ref 135–145)
Total Bilirubin: 1.3 mg/dL — ABNORMAL HIGH (ref 0.3–1.2)
Total Protein: 6 g/dL — ABNORMAL LOW (ref 6.5–8.1)

## 2022-03-04 NOTE — Telephone Encounter (Signed)
Rn attempted to call pt concerning his upcoming lab schedule as requested by [facility] medical oncology team. A voice mail was left for pt to call RN Anderson Malta back upon receipt. Rn will attempt to call again if pt has not called back by end of day today.

## 2022-03-04 NOTE — Progress Notes (Signed)
Orders placed for patient as provided by [facility] guidance for patient monitoring and follow up.   03-11-22 cbc with diff, plt 03-18-22 cbc with diff, plt 03-25-22 cbc with diff, plt, cmp 04-01-22 cbc with diff, plt 04-08-22 cbc with diff, plt 04-15-22 cmp  Lab seats also placed, Rn will call pt with details of orders and times of appointments.

## 2022-03-04 NOTE — Telephone Encounter (Signed)
Rn [name] [name] was able to get a reach pt on second attempt to let him know about his upcoming lab schedule. Rn also congratulated pt on being done with his therapy today. He was very excited and got the ring the bell today. Pt and family understood about weekly lab appointments at 1030am for the next 6 weeks. No questions or concerns with this phone call.

## 2022-03-05 NOTE — Radiation Completion Notes (Signed)
Patient Name: RAMARI, REOME MRN: [id number] Date of Birth: 04-11-1950 Referring Physician: Cyndi Bender, M.D. Date of Service: 2022-03-05 Radiation Oncologist: Eppie Gibson, M.D. [place]                             RADIATION ONCOLOGY END OF TREATMENT NOTE     Diagnosis: C79.31 Secondary malignant neoplasm of brain Intent: Palliative     ==========DELIVERED PLANS==========  First Treatment Date: 2022-02-11 - Last Treatment Date: 2022-03-04   Plan Name: Brain Site: Brain Technique: Isodose Plan Mode: Photon Dose Per Fraction: 2.5 Gy Prescribed Dose (Delivered / Prescribed): 10 Gy / 10 Gy Prescribed Fxs (Delivered / Prescribed): 4 / 4   Plan Name: Brain_Bst Site: Brain Technique: IMRT Mode: Photon Dose Per Fraction: 2.5 Gy Prescribed Dose (Delivered / Prescribed): 30 Gy / 30 Gy Prescribed Fxs (Delivered / Prescribed): 12 / 12     ==========ON TREATMENT VISIT DATES========== 2022-02-16, 2022-02-23, 2022-03-02     ==========UPCOMING VISITS==========       ==========APPENDIX - ON TREATMENT VISIT NOTES==========   See weekly On Treatment Notes is Epic for details.

## 2022-03-11 ENCOUNTER — Other Ambulatory Visit: Payer: Self-pay

## 2022-03-11 ENCOUNTER — Ambulatory Visit
Admission: RE | Admit: 2022-03-11 | Discharge: 2022-03-11 | Disposition: A | Discharge status: Home / Self Care | Payer: Medicare Other | Source: Ambulatory Visit | Attending: Radiation Oncology | Admitting: Radiation Oncology

## 2022-03-11 DIAGNOSIS — C801 Malignant (primary) neoplasm, unspecified: Secondary | ICD-10-CM | POA: Diagnosis not present

## 2022-03-11 DIAGNOSIS — C7931 Secondary malignant neoplasm of brain: Secondary | ICD-10-CM | POA: Insufficient documentation

## 2022-03-11 DIAGNOSIS — D496 Neoplasm of unspecified behavior of brain: Secondary | ICD-10-CM

## 2022-03-11 LAB — CBC WITH DIFFERENTIAL (CANCER CENTER ONLY)
Abs Immature Granulocytes: 0.09 10*3/uL — ABNORMAL HIGH (ref 0.00–0.07)
Basophils Absolute: 0.1 10*3/uL (ref 0.0–0.1)
Basophils Relative: 1 %
Eosinophils Absolute: 0.1 10*3/uL (ref 0.0–0.5)
Eosinophils Relative: 2 %
HCT: 38.3 % — ABNORMAL LOW (ref 39.0–52.0)
Hemoglobin: 13.2 g/dL (ref 13.0–17.0)
Immature Granulocytes: 1 %
Lymphocytes Relative: 12 %
Lymphs Abs: 0.8 10*3/uL (ref 0.7–4.0)
MCH: 29.1 pg (ref 26.0–34.0)
MCHC: 34.5 g/dL (ref 30.0–36.0)
MCV: 84.4 fL (ref 80.0–100.0)
Monocytes Absolute: 0.4 10*3/uL (ref 0.1–1.0)
Monocytes Relative: 6 %
Neutro Abs: 5.4 10*3/uL (ref 1.7–7.7)
Neutrophils Relative %: 78 %
Platelet Count: 188 10*3/uL (ref 150–400)
RBC: 4.54 MIL/uL (ref 4.22–5.81)
RDW: 15.6 % — ABNORMAL HIGH (ref 11.5–15.5)
WBC Count: 6.9 10*3/uL (ref 4.0–10.5)
nRBC: 0 % (ref 0.0–0.2)

## 2022-03-18 ENCOUNTER — Ambulatory Visit
Admission: RE | Admit: 2022-03-18 | Discharge: 2022-03-18 | Disposition: A | Discharge status: Home / Self Care | Payer: Medicare Other | Source: Ambulatory Visit | Attending: Radiation Oncology | Admitting: Radiation Oncology

## 2022-03-18 ENCOUNTER — Other Ambulatory Visit: Payer: Self-pay

## 2022-03-18 DIAGNOSIS — D496 Neoplasm of unspecified behavior of brain: Secondary | ICD-10-CM

## 2022-03-18 DIAGNOSIS — C801 Malignant (primary) neoplasm, unspecified: Secondary | ICD-10-CM | POA: Diagnosis not present

## 2022-03-18 DIAGNOSIS — C7931 Secondary malignant neoplasm of brain: Secondary | ICD-10-CM | POA: Diagnosis not present

## 2022-03-18 LAB — CBC WITH DIFFERENTIAL (CANCER CENTER ONLY)
Abs Immature Granulocytes: 0.17 10*3/uL — ABNORMAL HIGH (ref 0.00–0.07)
Basophils Absolute: 0.1 10*3/uL (ref 0.0–0.1)
Basophils Relative: 1 %
Eosinophils Absolute: 0.3 10*3/uL (ref 0.0–0.5)
Eosinophils Relative: 3 %
HCT: 38.5 % — ABNORMAL LOW (ref 39.0–52.0)
Hemoglobin: 12.9 g/dL — ABNORMAL LOW (ref 13.0–17.0)
Immature Granulocytes: 2 %
Lymphocytes Relative: 17 %
Lymphs Abs: 1.7 10*3/uL (ref 0.7–4.0)
MCH: 28.7 pg (ref 26.0–34.0)
MCHC: 33.5 g/dL (ref 30.0–36.0)
MCV: 85.6 fL (ref 80.0–100.0)
Monocytes Absolute: 0.8 10*3/uL (ref 0.1–1.0)
Monocytes Relative: 8 %
Neutro Abs: 6.8 10*3/uL (ref 1.7–7.7)
Neutrophils Relative %: 69 %
Platelet Count: 210 10*3/uL (ref 150–400)
RBC: 4.5 MIL/uL (ref 4.22–5.81)
RDW: 15.4 % (ref 11.5–15.5)
WBC Count: 9.8 10*3/uL (ref 4.0–10.5)
nRBC: 0 % (ref 0.0–0.2)

## 2022-03-20 DIAGNOSIS — G40909 Epilepsy, unspecified, not intractable, without status epilepticus: Secondary | ICD-10-CM | POA: Diagnosis not present

## 2022-03-20 DIAGNOSIS — C7931 Secondary malignant neoplasm of brain: Secondary | ICD-10-CM | POA: Diagnosis not present

## 2022-03-20 DIAGNOSIS — E118 Type 2 diabetes mellitus with unspecified complications: Secondary | ICD-10-CM | POA: Diagnosis not present

## 2022-03-20 DIAGNOSIS — K3189 Other diseases of stomach and duodenum: Secondary | ICD-10-CM | POA: Diagnosis not present

## 2022-03-25 ENCOUNTER — Other Ambulatory Visit: Payer: Self-pay

## 2022-03-25 ENCOUNTER — Ambulatory Visit
Admission: RE | Admit: 2022-03-25 | Discharge: 2022-03-25 | Disposition: A | Discharge status: Home / Self Care | Payer: Medicare Other | Source: Ambulatory Visit | Attending: Radiation Oncology | Admitting: Radiation Oncology

## 2022-03-25 DIAGNOSIS — C7931 Secondary malignant neoplasm of brain: Secondary | ICD-10-CM | POA: Diagnosis not present

## 2022-03-25 DIAGNOSIS — D496 Neoplasm of unspecified behavior of brain: Secondary | ICD-10-CM

## 2022-03-25 DIAGNOSIS — C801 Malignant (primary) neoplasm, unspecified: Secondary | ICD-10-CM | POA: Diagnosis not present

## 2022-03-25 LAB — CMP (CANCER CENTER ONLY)
ALT: 24 U/L (ref 0–44)
AST: 23 U/L (ref 15–41)
Albumin: 3.8 g/dL (ref 3.5–5.0)
Alkaline Phosphatase: 29 U/L — ABNORMAL LOW (ref 38–126)
Anion gap: 8 (ref 5–15)
BUN: 18 mg/dL (ref 8–23)
CO2: 27 mmol/L (ref 22–32)
Calcium: 9.5 mg/dL (ref 8.9–10.3)
Chloride: 106 mmol/L (ref 98–111)
Creatinine: 1.07 mg/dL (ref 0.61–1.24)
GFR, Estimated: >60 mL/min (ref >60)
Glucose, Bld: 150 mg/dL — ABNORMAL HIGH (ref 70–99)
Potassium: 3.8 mmol/L (ref 3.5–5.1)
Sodium: 141 mmol/L (ref 135–145)
Total Bilirubin: 1.4 mg/dL — ABNORMAL HIGH (ref 0.3–1.2)
Total Protein: 6.5 g/dL (ref 6.5–8.1)

## 2022-03-25 LAB — CBC WITH DIFFERENTIAL (CANCER CENTER ONLY)
Abs Immature Granulocytes: 0.07 10*3/uL (ref 0.00–0.07)
Basophils Absolute: 0.1 10*3/uL (ref 0.0–0.1)
Basophils Relative: 1 %
Eosinophils Absolute: 0.4 10*3/uL (ref 0.0–0.5)
Eosinophils Relative: 5 %
HCT: 39.6 % (ref 39.0–52.0)
Hemoglobin: 13.4 g/dL (ref 13.0–17.0)
Immature Granulocytes: 1 %
Lymphocytes Relative: 36 %
Lymphs Abs: 2.4 10*3/uL (ref 0.7–4.0)
MCH: 28.6 pg (ref 26.0–34.0)
MCHC: 33.8 g/dL (ref 30.0–36.0)
MCV: 84.4 fL (ref 80.0–100.0)
Monocytes Absolute: 0.7 10*3/uL (ref 0.1–1.0)
Monocytes Relative: 10 %
Neutro Abs: 3.2 10*3/uL (ref 1.7–7.7)
Neutrophils Relative %: 47 %
Platelet Count: 176 10*3/uL (ref 150–400)
RBC: 4.69 MIL/uL (ref 4.22–5.81)
RDW: 15.4 % (ref 11.5–15.5)
WBC Count: 6.8 10*3/uL (ref 4.0–10.5)
nRBC: 0 % (ref 0.0–0.2)

## 2022-04-01 ENCOUNTER — Ambulatory Visit: Payer: Medicare Other

## 2022-04-01 ENCOUNTER — Ambulatory Visit: Payer: Self-pay | Admitting: Radiation Oncology

## 2022-04-01 DIAGNOSIS — K3189 Other diseases of stomach and duodenum: Secondary | ICD-10-CM | POA: Diagnosis not present

## 2022-04-01 DIAGNOSIS — R1909 Other intra-abdominal and pelvic swelling, mass and lump: Secondary | ICD-10-CM | POA: Diagnosis not present

## 2022-04-01 DIAGNOSIS — Z79630 Long term (current) use of alkylating agent: Secondary | ICD-10-CM | POA: Diagnosis not present

## 2022-04-01 DIAGNOSIS — Z923 Personal history of irradiation: Secondary | ICD-10-CM | POA: Diagnosis not present

## 2022-04-01 DIAGNOSIS — C719 Malignant neoplasm of brain, unspecified: Secondary | ICD-10-CM | POA: Diagnosis not present

## 2022-04-01 DIAGNOSIS — R569 Unspecified convulsions: Secondary | ICD-10-CM | POA: Diagnosis not present

## 2022-04-01 DIAGNOSIS — Z79899 Other long term (current) drug therapy: Secondary | ICD-10-CM | POA: Diagnosis not present

## 2022-04-01 DIAGNOSIS — Z809 Family history of malignant neoplasm, unspecified: Secondary | ICD-10-CM | POA: Diagnosis not present

## 2022-04-03 ENCOUNTER — Inpatient Hospital Stay
Admission: RE | Admit: 2022-04-03 | Discharge: 2022-04-03 | Disposition: A | Discharge status: Home / Self Care | Payer: Self-pay | Source: Ambulatory Visit | Attending: Radiation Oncology | Admitting: Radiation Oncology

## 2022-04-03 ENCOUNTER — Other Ambulatory Visit: Payer: Self-pay | Admitting: Radiation Therapy

## 2022-04-03 ENCOUNTER — Ambulatory Visit: Payer: Medicare Other | Admitting: Radiation Oncology

## 2022-04-03 ENCOUNTER — Telehealth: Payer: Self-pay

## 2022-04-03 DIAGNOSIS — C719 Malignant neoplasm of brain, unspecified: Secondary | ICD-10-CM

## 2022-04-03 NOTE — Telephone Encounter (Signed)
Called and spoke with Jene Every today for follow up post radiation to the brain.  They completed their radiation on: 03/04/2022  Does the patient complain of any of the following: Headache: Denies Visual Changes: Unchanged. Continues to deal with poor eyesight  Hearing Changes: Denies Nausea: Denies Vomiting: Denies Balance or coordination issues: Unchanged and reports it's more related to his difficulty seeing clearly than feeling dizzy/off balance. Denies any recent falls.  Memory issues: Denies any new concerns  Is the patient currently on a Decadron regimen?:  Yes per neuro-oncologist Dr. Shaaron Adler ([place]): 4mg  twice daily for 4 days, then 4mg  daily until next visit in 1 month (05/02/2022).   Additional comments if applicable: Patient's wife report's he does not need the lab appointments that were set up for next week at the request of [place] (will cancel per patient/wife's request). Patient aware that he will have an in-person F/U with Dr. Isidore Moos after next MRI scan that will be done at [facility]

## 2022-04-08 ENCOUNTER — Ambulatory Visit: Payer: Medicare Other

## 2022-04-08 DIAGNOSIS — Z6834 Body mass index (BMI) 34.0-34.9, adult: Secondary | ICD-10-CM | POA: Diagnosis not present

## 2022-04-08 DIAGNOSIS — Z9181 History of falling: Secondary | ICD-10-CM | POA: Diagnosis not present

## 2022-04-08 DIAGNOSIS — J309 Allergic rhinitis, unspecified: Secondary | ICD-10-CM | POA: Diagnosis not present

## 2022-04-08 DIAGNOSIS — E7800 Pure hypercholesterolemia, unspecified: Secondary | ICD-10-CM | POA: Diagnosis not present

## 2022-04-08 DIAGNOSIS — R569 Unspecified convulsions: Secondary | ICD-10-CM | POA: Diagnosis not present

## 2022-04-08 DIAGNOSIS — Z87891 Personal history of nicotine dependence: Secondary | ICD-10-CM | POA: Diagnosis not present

## 2022-04-08 DIAGNOSIS — M720 Palmar fascial fibromatosis [Dupuytren]: Secondary | ICD-10-CM | POA: Diagnosis not present

## 2022-04-08 DIAGNOSIS — C7931 Secondary malignant neoplasm of brain: Secondary | ICD-10-CM | POA: Diagnosis not present

## 2022-04-08 DIAGNOSIS — E119 Type 2 diabetes mellitus without complications: Secondary | ICD-10-CM | POA: Diagnosis not present

## 2022-04-08 DIAGNOSIS — C710 Malignant neoplasm of cerebrum, except lobes and ventricles: Secondary | ICD-10-CM | POA: Diagnosis not present

## 2022-04-08 DIAGNOSIS — M1712 Unilateral primary osteoarthritis, left knee: Secondary | ICD-10-CM | POA: Diagnosis not present

## 2022-04-08 DIAGNOSIS — N529 Male erectile dysfunction, unspecified: Secondary | ICD-10-CM | POA: Diagnosis not present

## 2022-04-08 DIAGNOSIS — I10 Essential (primary) hypertension: Secondary | ICD-10-CM | POA: Diagnosis not present

## 2022-04-15 ENCOUNTER — Ambulatory Visit: Payer: Medicare Other

## 2022-04-17 DIAGNOSIS — M1712 Unilateral primary osteoarthritis, left knee: Secondary | ICD-10-CM | POA: Diagnosis not present

## 2022-04-17 DIAGNOSIS — C7931 Secondary malignant neoplasm of brain: Secondary | ICD-10-CM | POA: Diagnosis not present

## 2022-04-17 DIAGNOSIS — I10 Essential (primary) hypertension: Secondary | ICD-10-CM | POA: Diagnosis not present

## 2022-04-17 DIAGNOSIS — E7800 Pure hypercholesterolemia, unspecified: Secondary | ICD-10-CM | POA: Diagnosis not present

## 2022-04-17 DIAGNOSIS — C710 Malignant neoplasm of cerebrum, except lobes and ventricles: Secondary | ICD-10-CM | POA: Diagnosis not present

## 2022-04-17 DIAGNOSIS — E119 Type 2 diabetes mellitus without complications: Secondary | ICD-10-CM | POA: Diagnosis not present

## 2022-04-17 DIAGNOSIS — Z87891 Personal history of nicotine dependence: Secondary | ICD-10-CM | POA: Diagnosis not present

## 2022-04-17 DIAGNOSIS — Z9181 History of falling: Secondary | ICD-10-CM | POA: Diagnosis not present

## 2022-04-17 DIAGNOSIS — M720 Palmar fascial fibromatosis [Dupuytren]: Secondary | ICD-10-CM | POA: Diagnosis not present

## 2022-04-17 DIAGNOSIS — R569 Unspecified convulsions: Secondary | ICD-10-CM | POA: Diagnosis not present

## 2022-04-17 DIAGNOSIS — J309 Allergic rhinitis, unspecified: Secondary | ICD-10-CM | POA: Diagnosis not present

## 2022-04-17 DIAGNOSIS — Z6834 Body mass index (BMI) 34.0-34.9, adult: Secondary | ICD-10-CM | POA: Diagnosis not present

## 2022-04-17 DIAGNOSIS — N529 Male erectile dysfunction, unspecified: Secondary | ICD-10-CM | POA: Diagnosis not present

## 2022-04-18 ENCOUNTER — Emergency Department (HOSPITAL_COMMUNITY): Payer: Medicare Other

## 2022-04-18 ENCOUNTER — Emergency Department (HOSPITAL_COMMUNITY)
Admission: EM | Admit: 2022-04-18 | Discharge: 2022-04-18 | Disposition: A | Discharge status: Home / Self Care | Payer: Medicare Other | Attending: Emergency Medicine | Admitting: Emergency Medicine

## 2022-04-18 ENCOUNTER — Encounter (HOSPITAL_COMMUNITY): Payer: Self-pay

## 2022-04-18 DIAGNOSIS — R109 Unspecified abdominal pain: Secondary | ICD-10-CM | POA: Diagnosis not present

## 2022-04-18 DIAGNOSIS — S2242XA Multiple fractures of ribs, left side, initial encounter for closed fracture: Secondary | ICD-10-CM | POA: Diagnosis not present

## 2022-04-18 DIAGNOSIS — R1012 Left upper quadrant pain: Secondary | ICD-10-CM | POA: Diagnosis not present

## 2022-04-18 DIAGNOSIS — Z043 Encounter for examination and observation following other accident: Secondary | ICD-10-CM | POA: Diagnosis not present

## 2022-04-18 DIAGNOSIS — M25561 Pain in right knee: Secondary | ICD-10-CM | POA: Diagnosis not present

## 2022-04-18 DIAGNOSIS — Z7982 Long term (current) use of aspirin: Secondary | ICD-10-CM | POA: Diagnosis not present

## 2022-04-18 DIAGNOSIS — I700 Atherosclerosis of aorta: Secondary | ICD-10-CM | POA: Diagnosis not present

## 2022-04-18 DIAGNOSIS — S29091A Other injury of muscle and tendon of front wall of thorax, initial encounter: Secondary | ICD-10-CM | POA: Diagnosis not present

## 2022-04-18 DIAGNOSIS — W1830XA Fall on same level, unspecified, initial encounter: Secondary | ICD-10-CM | POA: Diagnosis not present

## 2022-04-18 MED ORDER — OXYCODONE-ACETAMINOPHEN 5-325 MG PO TABS: 1.0000 | ORAL_TABLET | Freq: Four times a day (QID) | ORAL | 0 refills | Status: DC | PRN

## 2022-04-18 NOTE — ED Provider Notes (Signed)
[name] EMERGENCY DEPARTMENT AT [facility] Provider Note   CSN: [id number] Arrival date & time: 04/18/22  1533     History  Chief Complaint  Patient presents with   Marletta Lor    [name] [name] is a 72 y.o. male.  72 year old male here complains of left shoulder and left chest and upper abdominal pain after fall about a week ago.  Patient is had trouble with his gait for some time.  Denies strike his head and having loss of consciousness.  Was seen in urgent care today after the fall and according to family at bedside those x-rays were negative.  Continues to note dull pain to his left shoulder that is worse with moving.  Also notes left lateral rib pain that is worse with taking deep breath.  Denies any dyspnea.  No hemoptysis.  Also notes left upper abdominal pain without fever.  Has medicated with his home medications without relief       Home Medications Prior to Admission medications   Medication Sig Start Date End Date Taking? Authorizing Provider  albuterol (PROVENTIL HFA;VENTOLIN HFA) 108 (90 Base) MCG/ACT inhaler Inhale 2 puffs into the lungs every 4 (four) hours as needed for shortness of breath. 02/07/15 05/06/21  [provider]  aspirin EC 81 MG tablet Take 81 mg by mouth daily. 02/24/13   [provider]  Blood Glucose Monitoring Suppl (FIFTY50 GLUCOSE METER 2.0) w/Device KIT Use as instructed Patient not taking: Reported on 02/10/2022 04/07/17   [provider]  Calcium Citrate-Vitamin D 500-12.5 MG-MCG CHEW Chew 1 tablet by mouth 3 (three) times daily.    [provider]  Lancets (ACCU-CHEK MULTICLIX) lancets  10/27/13   [provider]  metFORMIN (GLUCOPHAGE-XR) 500 MG 24 hr tablet Take 1,000 mg by mouth 2 (two) times daily. Patient not taking: Reported on 02/10/2022 04/18/20   [provider]  Multiple Vitamins-Minerals (BARIATRIC MULTIVITAMINS/IRON PO) Take 1 tablet by mouth daily.    [provider]   Omega-3 Fatty Acids (SM FISH OIL) 1000 MG CAPS Take 1,000 mg by mouth daily.    [provider]  ondansetron (ZOFRAN-ODT) 4 MG disintegrating tablet Dissolve 1 tablet (4 mg total) by mouth every 6 hours as needed for nausea or vomiting. Patient not taking: Reported on 02/10/2022 05/21/21   Kinsinger, De Blanch, MD  [facility] VERIO test strip USE 1 STRIP TO CHECK GLUCOSE ONCE DAILY FOR BLOOD SUGAR Patient not taking: Reported on 02/10/2022 07/21/17   [provider]  pantoprazole (PROTONIX) 20 MG tablet Take 20 mg by mouth daily. Patient not taking: Reported on 02/10/2022 03/07/20   [provider]  polyethylene glycol (MIRALAX / GLYCOLAX) 17 g packet Take 17 g by mouth daily as needed for moderate constipation.    [provider]      Allergies    Mercury    Review of Systems   Review of Systems  All other systems reviewed and are negative.   Physical Exam Updated Vital Signs BP (!) 140/96 (BP Location: Right Arm)   Pulse 94   Temp 97.6 F (36.4 C) (Oral)   Resp 16   SpO2 97%  Physical Exam Vitals and nursing note reviewed.  Constitutional:      General: He is not in acute distress.    Appearance: Normal appearance. He is well-developed. He is not toxic-appearing.  HENT:     Head: Normocephalic and atraumatic.  Eyes:     General: Lids are normal.  Conjunctiva/sclera: Conjunctivae normal.     Pupils: Pupils are equal, round, and reactive to light.  Neck:     Thyroid: No thyroid mass.     Trachea: No tracheal deviation.  Cardiovascular:     Rate and Rhythm: Normal rate and regular rhythm.     Heart sounds: Normal heart sounds. No murmur heard.    No gallop.  Pulmonary:     Effort: Pulmonary effort is normal. No respiratory distress.     Breath sounds: Normal breath sounds. No stridor. No decreased breath sounds, wheezing, rhonchi or rales.  Chest:     Chest wall: Tenderness present. No crepitus.    Abdominal:     General: There is no  distension.     Palpations: Abdomen is soft.     Tenderness: There is no abdominal tenderness. There is no rebound.  Musculoskeletal:     Left shoulder: Tenderness and bony tenderness present. No deformity. Decreased range of motion.       Arms:     Cervical back: Normal range of motion and neck supple.  Skin:    General: Skin is warm and dry.     Findings: No abrasion or rash.  Neurological:     Mental Status: He is alert and oriented to person, place, and time. Mental status is at baseline.     GCS: GCS eye subscore is 4. GCS verbal subscore is 5. GCS motor subscore is 6.     Cranial Nerves: No cranial nerve deficit.     Sensory: No sensory deficit.     Motor: Motor function is intact.  Psychiatric:        Attention and Perception: Attention normal.        Speech: Speech normal.        Behavior: Behavior normal.     ED Results / Procedures / Treatments   Labs (all labs ordered are listed, but only abnormal results are displayed) Labs Reviewed - No data to display  EKG None  Radiology No results found.  Procedures Procedures    Medications Ordered in ED Medications - No data to display  ED Course/ Medical Decision Making/ A&P                             Medical Decision Making Amount and/or Complexity of Data Reviewed Radiology: ordered.   X-ray of left shoulder without evidence of fracture.  Left lateral ribs do show 2 nondisplaced fractures per my review interpretation.  No pneumothorax appreciated.  Patient did have mechanical fall while he was here in the ED onto his right knee and x-rays of that knee per interpretation showed no acute fracture.  Patient been complaining of having some left upper quadrant abdominal pain.  Concern for possible splenic injury however CT of his abdomen per my interpretation showed no evidence of splenic injury.  Plain x-rays of his right humerus and elbow were also negative per my interpretation.  Plan will be for patient to  receive a spirometer due to his rib fractures and for him to follow-up with his doctor as needed.        Final Clinical Impression(s) / ED Diagnoses Final diagnoses:  None    Rx / DC Orders ED Discharge Orders     None         Lorre Nick, MD 04/18/22 2032

## 2022-04-18 NOTE — Discharge Instructions (Signed)
You have nondisplaced fractures of your left eighth and ninth rib.  Use the spirometer 3-4 times a day to keep your lungs inflated.  Follow-up with your doctor as needed

## 2022-04-18 NOTE — ED Notes (Signed)
Pt resting in bed with no acute distress noted at this time. Family at bedside.

## 2022-04-18 NOTE — ED Triage Notes (Signed)
Pt presents with c/o fall that occurred last Sunday. Per family, pt fell onto his stomach. Pt does currently have brain CA.

## 2022-04-23 DIAGNOSIS — C7931 Secondary malignant neoplasm of brain: Secondary | ICD-10-CM | POA: Diagnosis not present

## 2022-04-23 DIAGNOSIS — N529 Male erectile dysfunction, unspecified: Secondary | ICD-10-CM | POA: Diagnosis not present

## 2022-04-23 DIAGNOSIS — J309 Allergic rhinitis, unspecified: Secondary | ICD-10-CM | POA: Diagnosis not present

## 2022-04-23 DIAGNOSIS — M1712 Unilateral primary osteoarthritis, left knee: Secondary | ICD-10-CM | POA: Diagnosis not present

## 2022-04-23 DIAGNOSIS — Z87891 Personal history of nicotine dependence: Secondary | ICD-10-CM | POA: Diagnosis not present

## 2022-04-23 DIAGNOSIS — R569 Unspecified convulsions: Secondary | ICD-10-CM | POA: Diagnosis not present

## 2022-04-23 DIAGNOSIS — Z9181 History of falling: Secondary | ICD-10-CM | POA: Diagnosis not present

## 2022-04-23 DIAGNOSIS — E7800 Pure hypercholesterolemia, unspecified: Secondary | ICD-10-CM | POA: Diagnosis not present

## 2022-04-23 DIAGNOSIS — E119 Type 2 diabetes mellitus without complications: Secondary | ICD-10-CM | POA: Diagnosis not present

## 2022-04-23 DIAGNOSIS — M720 Palmar fascial fibromatosis [Dupuytren]: Secondary | ICD-10-CM | POA: Diagnosis not present

## 2022-04-23 DIAGNOSIS — Z6834 Body mass index (BMI) 34.0-34.9, adult: Secondary | ICD-10-CM | POA: Diagnosis not present

## 2022-04-23 DIAGNOSIS — C710 Malignant neoplasm of cerebrum, except lobes and ventricles: Secondary | ICD-10-CM | POA: Diagnosis not present

## 2022-04-23 DIAGNOSIS — I10 Essential (primary) hypertension: Secondary | ICD-10-CM | POA: Diagnosis not present

## 2022-04-24 DIAGNOSIS — C710 Malignant neoplasm of cerebrum, except lobes and ventricles: Secondary | ICD-10-CM | POA: Diagnosis not present

## 2022-04-24 DIAGNOSIS — I10 Essential (primary) hypertension: Secondary | ICD-10-CM | POA: Diagnosis not present

## 2022-04-24 DIAGNOSIS — Z6834 Body mass index (BMI) 34.0-34.9, adult: Secondary | ICD-10-CM | POA: Diagnosis not present

## 2022-04-24 DIAGNOSIS — M720 Palmar fascial fibromatosis [Dupuytren]: Secondary | ICD-10-CM | POA: Diagnosis not present

## 2022-04-24 DIAGNOSIS — M1712 Unilateral primary osteoarthritis, left knee: Secondary | ICD-10-CM | POA: Diagnosis not present

## 2022-04-24 DIAGNOSIS — Z9181 History of falling: Secondary | ICD-10-CM | POA: Diagnosis not present

## 2022-04-24 DIAGNOSIS — J309 Allergic rhinitis, unspecified: Secondary | ICD-10-CM | POA: Diagnosis not present

## 2022-04-24 DIAGNOSIS — R569 Unspecified convulsions: Secondary | ICD-10-CM | POA: Diagnosis not present

## 2022-04-24 DIAGNOSIS — E119 Type 2 diabetes mellitus without complications: Secondary | ICD-10-CM | POA: Diagnosis not present

## 2022-04-24 DIAGNOSIS — Z87891 Personal history of nicotine dependence: Secondary | ICD-10-CM | POA: Diagnosis not present

## 2022-04-24 DIAGNOSIS — C7931 Secondary malignant neoplasm of brain: Secondary | ICD-10-CM | POA: Diagnosis not present

## 2022-04-24 DIAGNOSIS — E7800 Pure hypercholesterolemia, unspecified: Secondary | ICD-10-CM | POA: Diagnosis not present

## 2022-04-24 DIAGNOSIS — N529 Male erectile dysfunction, unspecified: Secondary | ICD-10-CM | POA: Diagnosis not present

## 2022-04-26 ENCOUNTER — Inpatient Hospital Stay (HOSPITAL_BASED_OUTPATIENT_CLINIC_OR_DEPARTMENT_OTHER)
Admission: EM | Admit: 2022-04-26 | Discharge: 2022-04-28 | DRG: 054 | Disposition: A | Discharge status: Home Health | Payer: Medicare Other | Attending: Family Medicine | Admitting: Family Medicine

## 2022-04-26 ENCOUNTER — Other Ambulatory Visit: Payer: Self-pay

## 2022-04-26 ENCOUNTER — Emergency Department (HOSPITAL_BASED_OUTPATIENT_CLINIC_OR_DEPARTMENT_OTHER): Payer: Medicare Other

## 2022-04-26 ENCOUNTER — Encounter (HOSPITAL_BASED_OUTPATIENT_CLINIC_OR_DEPARTMENT_OTHER): Payer: Self-pay | Admitting: Emergency Medicine

## 2022-04-26 DIAGNOSIS — I10 Essential (primary) hypertension: Secondary | ICD-10-CM | POA: Diagnosis not present

## 2022-04-26 DIAGNOSIS — Z7984 Long term (current) use of oral hypoglycemic drugs: Secondary | ICD-10-CM | POA: Diagnosis not present

## 2022-04-26 DIAGNOSIS — Z79899 Other long term (current) drug therapy: Secondary | ICD-10-CM

## 2022-04-26 DIAGNOSIS — Z923 Personal history of irradiation: Secondary | ICD-10-CM | POA: Diagnosis not present

## 2022-04-26 DIAGNOSIS — R9431 Abnormal electrocardiogram [ECG] [EKG]: Secondary | ICD-10-CM | POA: Diagnosis not present

## 2022-04-26 DIAGNOSIS — R4182 Altered mental status, unspecified: Secondary | ICD-10-CM | POA: Diagnosis not present

## 2022-04-26 DIAGNOSIS — R569 Unspecified convulsions: Secondary | ICD-10-CM

## 2022-04-26 DIAGNOSIS — Z7982 Long term (current) use of aspirin: Secondary | ICD-10-CM | POA: Diagnosis not present

## 2022-04-26 DIAGNOSIS — E119 Type 2 diabetes mellitus without complications: Secondary | ICD-10-CM | POA: Diagnosis not present

## 2022-04-26 DIAGNOSIS — Z8249 Family history of ischemic heart disease and other diseases of the circulatory system: Secondary | ICD-10-CM

## 2022-04-26 DIAGNOSIS — Z9884 Bariatric surgery status: Secondary | ICD-10-CM

## 2022-04-26 DIAGNOSIS — G9341 Metabolic encephalopathy: Secondary | ICD-10-CM | POA: Diagnosis present

## 2022-04-26 DIAGNOSIS — J849 Interstitial pulmonary disease, unspecified: Secondary | ICD-10-CM | POA: Diagnosis not present

## 2022-04-26 DIAGNOSIS — R29818 Other symptoms and signs involving the nervous system: Secondary | ICD-10-CM | POA: Diagnosis not present

## 2022-04-26 DIAGNOSIS — J4489 Other specified chronic obstructive pulmonary disease: Secondary | ICD-10-CM | POA: Diagnosis not present

## 2022-04-26 DIAGNOSIS — G9389 Other specified disorders of brain: Secondary | ICD-10-CM | POA: Diagnosis not present

## 2022-04-26 DIAGNOSIS — C719 Malignant neoplasm of brain, unspecified: Secondary | ICD-10-CM | POA: Diagnosis not present

## 2022-04-26 DIAGNOSIS — L8990 Pressure ulcer of unspecified site, unspecified stage: Secondary | ICD-10-CM | POA: Insufficient documentation

## 2022-04-26 DIAGNOSIS — E785 Hyperlipidemia, unspecified: Secondary | ICD-10-CM | POA: Diagnosis not present

## 2022-04-26 DIAGNOSIS — Z794 Long term (current) use of insulin: Secondary | ICD-10-CM | POA: Diagnosis not present

## 2022-04-26 DIAGNOSIS — K219 Gastro-esophageal reflux disease without esophagitis: Secondary | ICD-10-CM | POA: Diagnosis present

## 2022-04-26 DIAGNOSIS — R404 Transient alteration of awareness: Secondary | ICD-10-CM | POA: Diagnosis present

## 2022-04-26 DIAGNOSIS — Z87891 Personal history of nicotine dependence: Secondary | ICD-10-CM | POA: Diagnosis not present

## 2022-04-26 DIAGNOSIS — C714 Malignant neoplasm of occipital lobe: Principal | ICD-10-CM | POA: Diagnosis present

## 2022-04-26 DIAGNOSIS — E876 Hypokalemia: Secondary | ICD-10-CM | POA: Diagnosis present

## 2022-04-26 DIAGNOSIS — Z8616 Personal history of COVID-19: Secondary | ICD-10-CM

## 2022-04-26 DIAGNOSIS — Z6838 Body mass index (BMI) 38.0-38.9, adult: Secondary | ICD-10-CM | POA: Diagnosis not present

## 2022-04-26 DIAGNOSIS — L89152 Pressure ulcer of sacral region, stage 2: Secondary | ICD-10-CM | POA: Diagnosis present

## 2022-04-26 DIAGNOSIS — R4189 Other symptoms and signs involving cognitive functions and awareness: Principal | ICD-10-CM

## 2022-04-26 DIAGNOSIS — G40909 Epilepsy, unspecified, not intractable, without status epilepticus: Secondary | ICD-10-CM | POA: Diagnosis present

## 2022-04-26 DIAGNOSIS — I6523 Occlusion and stenosis of bilateral carotid arteries: Secondary | ICD-10-CM | POA: Diagnosis not present

## 2022-04-26 LAB — CBC WITH DIFFERENTIAL/PLATELET
Abs Immature Granulocytes: 0.13 10*3/uL — ABNORMAL HIGH (ref 0.00–0.07)
Basophils Absolute: 0.1 10*3/uL (ref 0.0–0.1)
Basophils Relative: 1 %
Eosinophils Absolute: 0.3 10*3/uL (ref 0.0–0.5)
Eosinophils Relative: 3 %
HCT: 40.8 % (ref 39.0–52.0)
Hemoglobin: 13.5 g/dL (ref 13.0–17.0)
Immature Granulocytes: 1 %
Lymphocytes Relative: 33 %
Lymphs Abs: 3.4 10*3/uL (ref 0.7–4.0)
MCH: 28.3 pg (ref 26.0–34.0)
MCHC: 33.1 g/dL (ref 30.0–36.0)
MCV: 85.5 fL (ref 80.0–100.0)
Monocytes Absolute: 0.8 10*3/uL (ref 0.1–1.0)
Monocytes Relative: 8 %
Neutro Abs: 5.6 10*3/uL (ref 1.7–7.7)
Neutrophils Relative %: 54 %
Platelets: 200 10*3/uL (ref 150–400)
RBC: 4.77 MIL/uL (ref 4.22–5.81)
RDW: 15.6 % — ABNORMAL HIGH (ref 11.5–15.5)
WBC: 10.3 10*3/uL (ref 4.0–10.5)
nRBC: 0 % (ref 0.0–0.2)

## 2022-04-26 LAB — LACTIC ACID, PLASMA: Lactic Acid, Venous: 1 mmol/L (ref 0.5–1.9)

## 2022-04-26 LAB — CBC
HCT: 39.1 % (ref 39.0–52.0)
Hemoglobin: 13.3 g/dL (ref 13.0–17.0)
MCH: 28.5 pg (ref 26.0–34.0)
MCHC: 34 g/dL (ref 30.0–36.0)
MCV: 83.9 fL (ref 80.0–100.0)
Platelets: 198 10*3/uL (ref 150–400)
RBC: 4.66 MIL/uL (ref 4.22–5.81)
RDW: 15.5 % (ref 11.5–15.5)
WBC: 6.7 10*3/uL (ref 4.0–10.5)
nRBC: 0 % (ref 0.0–0.2)

## 2022-04-26 LAB — I-STAT VENOUS BLOOD GAS, ED
Acid-Base Excess: 0 mmol/L (ref 0.0–2.0)
Bicarbonate: 25.8 mmol/L (ref 20.0–28.0)
Calcium, Ion: 1.23 mmol/L (ref 1.15–1.40)
HCT: 40 % (ref 39.0–52.0)
Hemoglobin: 13.6 g/dL (ref 13.0–17.0)
O2 Saturation: 51 %
Potassium: 3.2 mmol/L — ABNORMAL LOW (ref 3.5–5.1)
Sodium: 141 mmol/L (ref 135–145)
TCO2: 27 mmol/L (ref 22–32)
pCO2, Ven: 43 mmHg — ABNORMAL LOW (ref 44–60)
pH, Ven: 7.386 (ref 7.25–7.43)
pO2, Ven: 28 mmHg — CL (ref 32–45)

## 2022-04-26 LAB — COMPREHENSIVE METABOLIC PANEL
ALT: 21 U/L (ref 0–44)
AST: 22 U/L (ref 15–41)
Albumin: 3.9 g/dL (ref 3.5–5.0)
Alkaline Phosphatase: 39 U/L (ref 38–126)
Anion gap: 12 (ref 5–15)
BUN: 27 mg/dL — ABNORMAL HIGH (ref 8–23)
CO2: 23 mmol/L (ref 22–32)
Calcium: 9.3 mg/dL (ref 8.9–10.3)
Chloride: 103 mmol/L (ref 98–111)
Creatinine, Ser: 1 mg/dL (ref 0.61–1.24)
GFR, Estimated: >60 mL/min (ref >60)
Glucose, Bld: 105 mg/dL — ABNORMAL HIGH (ref 70–99)
Potassium: 3.1 mmol/L — ABNORMAL LOW (ref 3.5–5.1)
Sodium: 138 mmol/L (ref 135–145)
Total Bilirubin: 1.8 mg/dL — ABNORMAL HIGH (ref 0.3–1.2)
Total Protein: 7.2 g/dL (ref 6.5–8.1)

## 2022-04-26 LAB — URINALYSIS, ROUTINE W REFLEX MICROSCOPIC
Bilirubin Urine: NEGATIVE
Glucose, UA: NEGATIVE mg/dL
Hgb urine dipstick: NEGATIVE
Ketones, ur: 40 mg/dL — AB
Leukocytes,Ua: NEGATIVE
Nitrite: NEGATIVE
Protein, ur: NEGATIVE mg/dL
Specific Gravity, Urine: 1.015 (ref 1.005–1.030)
pH: 5.5 (ref 5.0–8.0)

## 2022-04-26 LAB — GLUCOSE, CAPILLARY: Glucose-Capillary: 149 mg/dL — ABNORMAL HIGH (ref 70–99)

## 2022-04-26 LAB — CREATININE, SERUM
Creatinine, Ser: 0.86 mg/dL (ref 0.61–1.24)
GFR, Estimated: >60 mL/min (ref >60)

## 2022-04-26 LAB — RAPID URINE DRUG SCREEN, HOSP PERFORMED
Amphetamines: NOT DETECTED
Barbiturates: NOT DETECTED
Benzodiazepines: NOT DETECTED
Cocaine: NOT DETECTED
Opiates: NOT DETECTED
Tetrahydrocannabinol: NOT DETECTED

## 2022-04-26 LAB — TROPONIN I (HIGH SENSITIVITY)
Troponin I (High Sensitivity): 3 ng/L (ref <18)
Troponin I (High Sensitivity): 3 ng/L (ref <18)

## 2022-04-26 LAB — APTT: aPTT: 23 seconds — ABNORMAL LOW (ref 24–36)

## 2022-04-26 LAB — PROTIME-INR
INR: 1 (ref 0.8–1.2)
Prothrombin Time: 12.6 seconds (ref 11.4–15.2)

## 2022-04-26 LAB — AMMONIA: Ammonia: 14 umol/L (ref 9–35)

## 2022-04-26 LAB — ETHANOL: Alcohol, Ethyl (B): <10 mg/dL (ref <10)

## 2022-04-26 LAB — HEMOGLOBIN A1C
Hgb A1c MFr Bld: 6.2 % — ABNORMAL HIGH (ref 4.8–5.6)
Mean Plasma Glucose: 131.24 mg/dL

## 2022-04-26 LAB — CBG MONITORING, ED: Glucose-Capillary: 100 mg/dL — ABNORMAL HIGH (ref 70–99)

## 2022-04-26 LAB — CK: Total CK: 60 U/L (ref 49–397)

## 2022-04-26 LAB — TSH: TSH: 1.192 u[IU]/mL (ref 0.350–4.500)

## 2022-04-26 MED ORDER — NALOXONE HCL 4 MG/0.1ML NA LIQD
NASAL | Status: AC
  Filled 2022-04-26: qty 4

## 2022-04-26 MED ORDER — DEXAMETHASONE SODIUM PHOSPHATE 10 MG/ML IJ SOLN: 10.0000 mg | Freq: Once | INTRAMUSCULAR | Status: DC

## 2022-04-26 MED ORDER — DEXAMETHASONE SODIUM PHOSPHATE 10 MG/ML IJ SOLN
10.0000 mg | Freq: Two times a day (BID) | INTRAMUSCULAR | Status: DC
  Administered 2022-04-26: 10 mg via INTRAVENOUS
  Filled 2022-04-26: qty 1

## 2022-04-26 MED ORDER — NALOXONE HCL 2 MG/2ML IJ SOSY: 1.0000 mg | PREFILLED_SYRINGE | Freq: Once | INTRAMUSCULAR | Status: DC

## 2022-04-26 MED ORDER — INSULIN ASPART 100 UNIT/ML IJ SOLN
0.0000 [IU] | Freq: Three times a day (TID) | INTRAMUSCULAR | Status: DC
  Administered 2022-04-27: 3 [IU] via SUBCUTANEOUS
  Administered 2022-04-27 – 2022-04-28 (×4): 2 [IU] via SUBCUTANEOUS

## 2022-04-26 MED ORDER — NALOXONE HCL 0.4 MG/ML IJ SOLN: 0.4000 mg | Freq: Once | INTRAMUSCULAR | Status: AC

## 2022-04-26 MED ORDER — LEVETIRACETAM IN NACL 1000 MG/100ML IV SOLN
1000.0000 mg | Freq: Once | INTRAVENOUS | Status: AC
  Administered 2022-04-26: 1000 mg via INTRAVENOUS
  Filled 2022-04-26: qty 100

## 2022-04-26 MED ORDER — DEXAMETHASONE SODIUM PHOSPHATE 10 MG/ML IJ SOLN
10.0000 mg | Freq: Once | INTRAMUSCULAR | Status: AC
  Administered 2022-04-26: 10 mg via INTRAVENOUS
  Filled 2022-04-26: qty 1

## 2022-04-26 MED ORDER — NALOXONE HCL 0.4 MG/ML IJ SOLN
INTRAMUSCULAR | Status: AC
  Administered 2022-04-26: 0.4 mg via INTRAVENOUS
  Filled 2022-04-26: qty 1

## 2022-04-26 MED ORDER — IOHEXOL 350 MG/ML SOLN
80.0000 mL | Freq: Once | INTRAVENOUS | Status: AC | PRN
  Administered 2022-04-26: 80 mL via INTRAVENOUS

## 2022-04-26 MED ORDER — LEVETIRACETAM IN NACL 1000 MG/100ML IV SOLN
1000.0000 mg | Freq: Two times a day (BID) | INTRAVENOUS | Status: DC
  Administered 2022-04-26 – 2022-04-28 (×4): 1000 mg via INTRAVENOUS
  Filled 2022-04-26 (×4): qty 100

## 2022-04-26 MED ORDER — LEVETIRACETAM IN NACL 1500 MG/100ML IV SOLN: 1500.0000 mg | Freq: Once | INTRAVENOUS | Status: DC

## 2022-04-26 MED ORDER — ENOXAPARIN SODIUM 40 MG/0.4ML IJ SOSY
40.0000 mg | PREFILLED_SYRINGE | INTRAMUSCULAR | Status: DC
  Administered 2022-04-26 – 2022-04-27 (×2): 40 mg via SUBCUTANEOUS
  Filled 2022-04-26 (×2): qty 0.4

## 2022-04-26 MED ORDER — INSULIN ASPART 100 UNIT/ML IJ SOLN: 0.0000 [IU] | Freq: Every day | INTRAMUSCULAR | Status: DC

## 2022-04-26 MED ORDER — LEVETIRACETAM IN NACL 500 MG/100ML IV SOLN: 500.0000 mg | Freq: Once | INTRAVENOUS | Status: DC

## 2022-04-26 MED ORDER — SODIUM CHLORIDE 0.9 % IV SOLN: INTRAVENOUS | Status: DC | PRN

## 2022-04-26 MED ORDER — ONDANSETRON HCL 4 MG PO TABS: 4.0000 mg | ORAL_TABLET | Freq: Four times a day (QID) | ORAL | Status: DC | PRN

## 2022-04-26 MED ORDER — SODIUM CHLORIDE 0.9 % IV SOLN: INTRAVENOUS | Status: DC

## 2022-04-26 MED ORDER — LEVETIRACETAM 500 MG PO TABS: 1000.0000 mg | ORAL_TABLET | Freq: Two times a day (BID) | ORAL | Status: DC

## 2022-04-26 MED ORDER — ONDANSETRON HCL 4 MG/2ML IJ SOLN: 4.0000 mg | Freq: Four times a day (QID) | INTRAMUSCULAR | Status: DC | PRN

## 2022-04-26 MED ORDER — LEVETIRACETAM IN NACL 500 MG/100ML IV SOLN
500.0000 mg | Freq: Once | INTRAVENOUS | Status: AC
  Administered 2022-04-26: 500 mg via INTRAVENOUS
  Filled 2022-04-26: qty 100

## 2022-04-26 NOTE — ED Provider Notes (Signed)
[place] [facility] Provider Note   CSN: [id number] Arrival date & time: 04/26/22  1157     History  Chief Complaint  Patient presents with   Loss of Consciousness    [name] [name] is a 72 y.o. male.  Level 5 caveat for acuity of condition.  Patient pulled from vehicle with decreased level of consciousness.  He was at church with his family when he apparently acutely slumped over in the pew and was minimally responsive.  Wife reported he was normal prior to this though "slept through Sunday school".  They were having difficulty arousing the patient but stated his blood pressure was normal and his blood sugar was not checked.  He is not a diabetic.  Family reports he was in and out of consciousness.  On arrival he was slumped over in the vehicle but did have a pulse.  He was not responsive to pain.  Patient did become awake and answering a few questions.  Wife reports history of brain cancer currently not receiving chemotherapy or radiation but did complete radiation previously.  He takes Decadron.  Has had seizures in the past but does not seem to be on seizure medication.  Recently diagnosed with rib fractures about 8 days ago and is been taking oxycodone.  On arrival to room, patient was minimally responsive to voice.  Blood sugar was 100.  Protecting airway.  Equal breath sounds bilaterally.  Neurological exam is nonfocal.  He will not hold either arm or leg off the bed against gravity.  No obvious facial droop.  Code stroke was activated after trial of Narcan.  The history is provided by the patient and the spouse. The history is limited by the condition of the patient.  Loss of Consciousness      Home Medications Prior to Admission medications   Medication Sig Start Date End Date Taking? Authorizing Provider  albuterol (PROVENTIL HFA;VENTOLIN HFA) 108 (90 Base) MCG/ACT inhaler Inhale 2 puffs into the lungs every 4 (four) hours as needed for  shortness of breath. 02/07/15 05/06/21  [provider]  aspirin EC 81 MG tablet Take 81 mg by mouth daily. 02/24/13   [provider]  Blood Glucose Monitoring Suppl (FIFTY50 GLUCOSE METER 2.0) w/Device KIT Use as instructed Patient not taking: Reported on 02/10/2022 04/07/17   [provider]  Calcium Citrate-Vitamin D 500-12.5 MG-MCG CHEW Chew 1 tablet by mouth 3 (three) times daily.    [provider]  Lancets (ACCU-CHEK MULTICLIX) lancets  10/27/13   [provider]  metFORMIN (GLUCOPHAGE-XR) 500 MG 24 hr tablet Take 1,000 mg by mouth 2 (two) times daily. Patient not taking: Reported on 02/10/2022 04/18/20   [provider]  Multiple Vitamins-Minerals (BARIATRIC MULTIVITAMINS/IRON PO) Take 1 tablet by mouth daily.    [provider]  Omega-3 Fatty Acids (SM FISH OIL) 1000 MG CAPS Take 1,000 mg by mouth daily.    [provider]  ondansetron (ZOFRAN-ODT) 4 MG disintegrating tablet Dissolve 1 tablet (4 mg total) by mouth every 6 hours as needed for nausea or vomiting. Patient not taking: Reported on 02/10/2022 05/21/21   Kinsinger, De Blanch, MD  [facility] VERIO test strip USE 1 STRIP TO CHECK GLUCOSE ONCE DAILY FOR BLOOD SUGAR Patient not taking: Reported on 02/10/2022 07/21/17   [provider]  oxyCODONE-acetaminophen (PERCOCET/ROXICET) 5-325 MG tablet Take 1 tablet by mouth every 6 (six) hours as needed for severe pain. 04/18/22   Lorre Nick, MD  pantoprazole (PROTONIX) 20 MG tablet Take 20 mg by mouth daily. Patient not taking: Reported on 02/10/2022 03/07/20   [provider]  polyethylene glycol (MIRALAX / GLYCOLAX) 17 g packet Take 17 g by mouth daily as needed for moderate constipation.    [provider]      Allergies    Mercury    Review of Systems   Review of Systems  Unable to perform ROS: Mental status change  Cardiovascular:  Positive for syncope.    Physical Exam Updated Vital Signs BP  (!) 153/73 (BP Location: Right Arm)   Pulse 67   Temp (!) 96.7 F (35.9 C) (Temporal)   Resp 13   SpO2 95%  Physical Exam Vitals and nursing note reviewed.  Constitutional:      General: He is not in acute distress.    Appearance: He is well-developed. He is obese. He is ill-appearing.     Comments: Arouses to voice, confused.  HENT:     Head: Normocephalic and atraumatic.     Mouth/Throat:     Pharynx: No oropharyngeal exudate.  Eyes:     Conjunctiva/sclera: Conjunctivae normal.     Pupils: Pupils are equal, round, and reactive to light.  Neck:     Comments: No meningismus. Cardiovascular:     Rate and Rhythm: Normal rate and regular rhythm.     Heart sounds: Normal heart sounds. No murmur heard. Pulmonary:     Effort: Pulmonary effort is normal. No respiratory distress.     Breath sounds: Normal breath sounds.  Chest:     Chest wall: No tenderness.  Abdominal:     Palpations: Abdomen is soft.     Tenderness: There is no abdominal tenderness. There is no guarding or rebound.  Musculoskeletal:        General: No swelling, tenderness or deformity. Normal range of motion.     Cervical back: Normal range of motion and neck supple.  Skin:    General: Skin is warm.  Neurological:     Mental Status: He is alert.     Cranial Nerves: No cranial nerve deficit.     Motor: No abnormal muscle tone.     Coordination: Coordination normal.     Comments: Oriented to person, waxing and waning mental status with difficulty staying awake.  No obvious facial droop.  Will follow some commands.  Will not hold up either arm or leg against gravity. Confused  Psychiatric:        Behavior: Behavior normal.     ED Results / Procedures / Treatments   Labs (all labs ordered are listed, but only abnormal results are displayed) Labs Reviewed  COMPREHENSIVE METABOLIC PANEL - Abnormal; Notable for the following components:      Result Value   Potassium 3.1 (*)    Glucose, Bld 105 (*)    BUN  27 (*)    Total Bilirubin 1.8 (*)    All other components within normal limits  APTT - Abnormal; Notable for the following components:   aPTT 23 (*)    All other components within normal limits  CBC WITH DIFFERENTIAL/PLATELET - Abnormal; Notable for the following components:   RDW 15.6 (*)    Abs Immature Granulocytes 0.13 (*)    All other components within normal limits  CBG MONITORING, ED - Abnormal; Notable for the following components:   Glucose-Capillary 100 (*)    All other components within normal limits  I-STAT VENOUS BLOOD GAS, ED - Abnormal; Notable  for the following components:   pCO2, Ven 43.0 (*)    pO2, Ven 28 (*)    Potassium 3.2 (*)    All other components within normal limits  CULTURE, BLOOD (ROUTINE X 2)  CULTURE, BLOOD (ROUTINE X 2)  CK  LACTIC ACID, PLASMA  AMMONIA  ETHANOL  PROTIME-INR  URINALYSIS, ROUTINE W REFLEX MICROSCOPIC  TSH  RAPID URINE DRUG SCREEN, HOSP PERFORMED  BLOOD GAS, VENOUS  TROPONIN I (HIGH SENSITIVITY)  TROPONIN I (HIGH SENSITIVITY)    EKG EKG Interpretation  Date/Time:  Sunday April 26 2022 12:07:27 EDT Ventricular Rate:  59 PR Interval:  185 QRS Duration: 118 QT Interval:  422 QTC Calculation: 418 R Axis:   -55 Text Interpretation: Sinus rhythm LAD, consider left anterior fascicular block No significant change was found Confirmed by Glynn Octave [phone]) on 04/26/2022 12:19:05 PM  Radiology DG Chest Portable 1 View  Result Date: 04/26/2022 CLINICAL DATA:  Altered mental status EXAM: PORTABLE CHEST 1 VIEW COMPARISON:  Chest x-ray 04/18/2022 FINDINGS: The cardiac silhouette, mediastinal and hilar contours are within normal limits and stable. No acute pulmonary findings. No pleural effusions or pneumothorax. The bony thorax is intact. IMPRESSION: No acute cardiopulmonary findings. Electronically Signed   By: Rudie Meyer M.D.   On: 04/26/2022 13:04   CT HEAD CODE STROKE WO CONTRAST`  Result Date: 04/26/2022 CLINICAL DATA:   Code stroke. Slumped over in tricks this morning. Generalized weakness. GBM. Status post whole brain radiation. EXAM: CT HEAD WITHOUT CONTRAST TECHNIQUE: Contiguous axial images were obtained from the base of the skull through the vertex without intravenous contrast. RADIATION DOSE REDUCTION: This exam was performed according to the departmental dose-optimization program which includes automated exposure control, adjustment of the mA and/or kV according to patient size and/or use of iterative reconstruction technique. COMPARISON:  CT head without contrast 01/26/2022. MR head without and with contrast 01/26/2022. FINDINGS: Brain: Multiple bilateral hypodense lesions are again noted. The largest right-sided lesion is in the high posterior parietal lobe measuring up to 2.6 cm, increased in size from prior studies. Surrounding edema has also increased. A lesion in the posterior left occipital lobe now measures 16 mm, increased in size. Surrounding vasogenic edema is noted. Diffuse white matter hypoattenuation is consistent with other known lesions as well as whole-brain radiation. No other focal lesions are identified without IV contrast. No acute hemorrhage present. The ventricles are of normal size. No significant extraaxial fluid collection is present. Brainstem is unremarkable. Midline structures are within normal limits. Vascular: No hyperdense vessel or unexpected calcification. Skull: Right parietal burr hole is noted. Calvarium is otherwise within normal limits. No focal osseous lesions are present. The extracranial soft tissues are within normal limits. Sinuses/Orbits: The paranasal sinuses and mastoid air cells are clear. The globes and orbits are within normal limits. IMPRESSION: 1. Interval increase in size of bilateral posterior hypodense lesions and associated edema compatible with progressive malignancy. 2. No acute hemorrhage. 3. Diffuse white matter hypoattenuation is consistent with other known lesions  as well as whole-brain radiation. These results were called by telephone at the time of interpretation on 04/26/2022 at 12:34 pm to provider [facility] , who verbally acknowledged these results. Electronically Signed   By: Marin Roberts M.D.   On: 04/26/2022 12:35    Procedures .Critical Care  Performed by: Glynn Octave, MD Authorized by: Glynn Octave, MD   Critical care provider statement:    Critical care time (minutes):  45   Critical care time was exclusive  of:  Separately billable procedures and treating other patients   Critical care was necessary to treat or prevent imminent or life-threatening deterioration of the following conditions:  CNS failure or compromise   Critical care was time spent personally by me on the following activities:  Development of treatment plan with patient or surrogate, discussions with consultants, evaluation of patient's response to treatment, examination of patient, ordering and review of laboratory studies, ordering and review of radiographic studies, ordering and performing treatments and interventions, pulse oximetry, re-evaluation of patient's condition, review of old charts, blood draw for specimens and obtaining history from patient or surrogate   I assumed direction of critical care for this patient from another provider in my specialty: no     Care discussed with: admitting provider       Medications Ordered in ED Medications  naloxone (NARCAN) 4 MG/0.1ML nasal spray kit (has no administration in time range)    ED Course/ Medical Decision Making/ A&P                             Medical Decision Making Amount and/or Complexity of Data Reviewed Independent Historian: spouse Labs: ordered. Decision-making details documented in ED Course. Radiology: ordered and independent interpretation performed. Decision-making details documented in ED Course. ECG/medicine tests: ordered and independent interpretation performed.  Decision-making details documented in ED Course.  Risk Prescription drug management. Decision regarding hospitalization.  Decreased level of consciousness and patient with known brain tumor.  On arrival patient is obtunded, waxing and waning level of alertness.  Would not follow commands reliably.  Blood sugar is 100.  Dose of Narcan given without change.  Emergent CT scan will be obtained.  Code stroke activated.  Consider possibility of seizure.  Patient does take Keppra. No real response to Narcan.  Suspect likely postictal state from possible seizure.  Will load with Keppra.  Unclear compliance.  Patient taken to CT scan.  No evidence of hemorrhage.  Does show worsening edema of his brain lesions.  Discussed with Dr. Alfredo Batty. Results reviewed and interpreted by me. Will give dose of Decadron.  Patient seen by neurology Dr. Amada Jupiter.  He agrees likely seizure with subsequent postictal state.  Does recommend CTA to assess for basilar artery occlusion.  He feels patient does not need MRI if he returns to his baseline.Agrees no role for acute neurosurgical intervention.  CTA is negative for basilar artery occlusion.  No large vessel occlusion.  Patient's mentation is much improved and he is awake and alert and interactive with the family in the room.  They confirm he is not yet back to baseline but rapidly improving.  He does not recall what happened earlier.  Suspect postictal state.  Will admit for observation per neurology recommendations with MRI.  Family states he is scheduled for an MRI next week.  Plan admission. D/w Dr. Ophelia Charter        Final Clinical Impression(s) / ED Diagnoses Final diagnoses:  Decreased level of consciousness    Rx / DC Orders ED Discharge Orders     None         Baylynn Shifflett, Jeannett Senior, MD 04/26/22 1640

## 2022-04-26 NOTE — Progress Notes (Signed)
Telestroke RN Code Stroke -  1224- stroke cart activation. EDP at bedside for report. Pt has already been for CT  1229- Neuro paged 1234- Dr Amada Jupiter on camera for stroke assessment 1254- assessment complete- no TNK

## 2022-04-26 NOTE — ED Notes (Signed)
Patient transported to CT on cardiac monitor with RN.

## 2022-04-26 NOTE — ED Notes (Signed)
Pt able to stand with 2 assists; attempted to use urinal, but unable to void at this time; pt A&O, sts does not remember events from this morning

## 2022-04-26 NOTE — Progress Notes (Signed)
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  He was seen in the ER on 4/13 for a fall and was discharged with supportive care for rib fractures.  Today slumped over at church, breathing with pulse.  Seen by Onc at [facility] and Rad Onc in [place].  Unresponsive, called code stroke.  CT without hemorrhage, progressive lesions and edema compared to prior.  Neuro did telemedicine consult, ?post-ictal state since he is improving significantly but not back to baseline.  No basilar artery occlusion.  Increased Keppra.  Given Decadron for edema.  Full code, family wants everything done.  Neurology recommends obs and MRI.  Seems appropriate for telemetry.   Plan of care: The patient is accepted for observation to Telemetry unit, at [facility].   Author: Jonah Blue, MD 04/26/2022  Check [email or web address] for on-call coverage.  Nursing staff, Please call [facility] Admits & Consults System-Wide number on Amion as soon as patient's arrival, so appropriate admitting provider can evaluate the pt.

## 2022-04-26 NOTE — ED Triage Notes (Signed)
Pt to ED via POV; per staff, was slumped over in car; family reports in and out of consciousness; pt alert, not following commands on arrival to room

## 2022-04-26 NOTE — Progress Notes (Signed)
Page sent to provider to notify of arrival to floor. VSS.

## 2022-04-26 NOTE — H&P (Signed)
History and Physical    Patient: [name] [name] ZOX:[id number] DOB: 06/05/1950 DOA: 04/26/2022 DOS: the patient was seen and examined on 04/26/2022 PCP: Lonie Peak, PA-C  Patient coming from: Home  Chief Complaint:  Chief Complaint  Patient presents with   Loss of Consciousness   HPI: [name] [name] is a 72 y.o. 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  Patient had known history of seizure disorder on Keppra 750 mg twice a day.  Was brought to the ER at [facility].  Initially a code stroke was called for.  Teleneurology was involved and after evaluation appears to have suspected seizure.  Patient had initial head CT without contrast showing his tumor has gotten larger.  He also has evidence of brain edema.  Recommendation is to increase the dose of his Keppra from 750 mg to 8000 mg twice a day.  Also was given Decadron.  Patient to be admitted and MRI was EEG to be done.  He is currently waking up after sleeping literally all day.  He feels awful but nonspecific complaint.  Able to open his eyes.  Patient weak and flaccid on both upper arms.  Review of Systems: As mentioned in the history of present illness. All other systems reviewed and are negative. Past Medical History:  Diagnosis Date   Arthritis    Cholelithiasis    Chronic cough    04-26-2019 per pt occasionally productive in am   Difficulty breathing    with activity  since covid 07/ 2020,  pt has underlining ILD/ COPD w/ asthma   (04-26-2019 pt has breo inhaler ordered by pulmonologist daily, however, pt not taking as prescribed , still had not refilled this medication     Full dentures    GERD (gastroesophageal reflux disease)    Heart murmur    DX IN 1975 WHEN GOIN INTO [employer]IR [place]- NOTHING SINCE   History of 2019 novel coronavirus disease (COVID-19)    tested positive 07-08-2018 (results in epic) , no hospital admission; pt had pneumonia, fever, chills, body aches, cough;   (04-26-2019 pt still has residual difficulty breathing with activity   Hyperlipidemia    Hypertension    followed by pcp   (04-26-2019  per pt never had a stress test)   ILD (interstitial lung disease)    pulmonologist--- dr Demetrius Charity. Isaiah Serge   OSA treated with BiPAP    NOT USED IN 5-6 YEARS AGO   Pulmonary nodule    followed by pulmonology   Type 2 diabetes mellitus    followed by pcp   (04-26-2019 per pt not checking blood sugar at this time, his machine is broken, in process of getting  another one)   Wears glasses    Past Surgical History:  Procedure Laterality Date   CHOLECYSTECTOMY N/A 05/01/2019   Procedure: LAPAROSCOPIC CHOLECYSTECTOMY;  Surgeon: Kinsinger, De Blanch, MD;  Location: [facility];  Service: General;  Laterality: N/A;   COLONOSCOPY  last one 02-07-2016 dr danis   LAPAROSCOPIC ASSISTED VENTRAL HERNIA REPAIR  09-28-2006    and 12-04-2008 for recurrent    TONSILLECTOMY  child   UMBILICAL HERNIA REPAIR  09-26-2004      UPPER GI ENDOSCOPY  N/A 05/19/2021   Procedure: UPPER GI ENDOSCOPY;  Surgeon: Sheliah Hatch, De Blanch, MD;  Location: [facility];  Service: General;  Laterality: N/A;   Social History:  reports that he quit smoking about 24 years ago. His smoking use included cigarettes. He has never used smokeless tobacco. He reports that he does not drink alcohol and does not use drugs.  Allergies  Allergen Reactions   Mercury Swelling    Family History  Problem Relation Age of Onset   Cancer Mother        ? type   Heart disease Father    Heart disease Brother    Colon cancer Neg Hx     Prior to Admission medications   Medication Sig  Start Date End Date Taking? Authorizing Provider  albuterol (PROVENTIL HFA;VENTOLIN HFA) 108 (90 Base) MCG/ACT inhaler Inhale 2 puffs into the lungs every 4 (four) hours as needed for shortness of breath. 02/07/15 05/06/21  [provider]  aspirin EC 81 MG tablet Take 81 mg by mouth daily. 02/24/13   [provider]  Blood Glucose Monitoring Suppl (FIFTY50 GLUCOSE METER 2.0) w/Device KIT Use as instructed Patient not taking: Reported on 02/10/2022 04/07/17   [provider]  Calcium Citrate-Vitamin D 500-12.5 MG-MCG CHEW Chew 1 tablet by mouth 3 (three) times daily.    [provider]  Lancets (ACCU-CHEK MULTICLIX) lancets  10/27/13   [provider]  metFORMIN (GLUCOPHAGE-XR) 500 MG 24 hr tablet Take 1,000 mg by mouth 2 (two) times daily. Patient not taking: Reported on 02/10/2022 04/18/20   [provider]  Multiple Vitamins-Minerals (BARIATRIC MULTIVITAMINS/IRON PO) Take 1 tablet by mouth daily.    [provider]  Omega-3 Fatty Acids (SM FISH OIL) 1000 MG CAPS Take 1,000 mg by mouth daily.    [provider]  ondansetron (ZOFRAN-ODT) 4 MG disintegrating tablet Dissolve 1 tablet (4 mg total) by mouth every 6 hours as needed for nausea or vomiting. Patient not taking: Reported on 02/10/2022 05/21/21   Kinsinger, De Blanch, MD  [facility] VERIO test strip USE 1 STRIP TO CHECK GLUCOSE ONCE DAILY FOR BLOOD SUGAR Patient not taking: Reported on 02/10/2022 07/21/17   [provider]  oxyCODONE-acetaminophen (PERCOCET/ROXICET) 5-325 MG tablet Take 1 tablet by mouth every 6 (six) hours as needed for severe pain. 04/18/22   Lorre Nick, MD  pantoprazole (PROTONIX) 20 MG tablet Take 20 mg by mouth daily. Patient not taking: Reported on 02/10/2022 03/07/20   [provider]  polyethylene glycol (MIRALAX / GLYCOLAX) 17 g packet Take 17 g by mouth daily as needed for moderate constipation.    [provider]    Physical  Exam: Vitals:   04/26/22 1259 04/26/22 1600 04/26/22 1900 04/26/22 2035  BP:  121/72 125/65 (!) 141/65  Pulse:  70 62 (!) 56  Resp:  Temp: (!) 96.7 F (35.9 C) 97.8 F (36.6 C)  97.8 F (36.6 C)  TempSrc: Temporal Oral  Oral  SpO2:  91% 94% 93%   Constitutional: Drowsy but arousable NAD, calm, comfortable Eyes: PERRL, lids and conjunctivae normal ENMT: Mucous membranes are moist. Posterior pharynx clear of any exudate or lesions.Normal dentition.  Neck: normal, supple, no masses, no thyromegaly Respiratory: clear to auscultation bilaterally, no wheezing, no crackles. Normal respiratory effort. No accessory muscle use.  Cardiovascular: Regular rate and rhythm, no murmurs / rubs / gallops. No extremity edema. 2+ pedal pulses. No carotid bruits.  Abdomen: no tenderness, no masses palpated. No  hepatosplenomegaly. Bowel sounds positive.  Musculoskeletal: Good range of motion, no joint swelling or tenderness, Skin: no rashes, lesions, ulcers. No induration Neurologic: CN 2-12 grossly intact.  For orientation, unable to lift or hold hands and legs to gravity  psychiatric: Drowsy  Data Reviewed:  CBC complete within normal.  Hemoglobin A1c is 6.2, urine drug screen negative.  Chemistry also within normal.  CT angiogram of the head and neck shows mild segmental narrowing in the distal ACA and MCA branches normal variant CTA cycle Willis.  CT head code stroke shows interval increase in size of bilateral posterior hypodense lesions and associated edema compatible with progressive malignancy.  Diffuse white matter hypoattenuation consistent with whole brain radiation as well as no acute hemorrhage.  Chest x-ray showed no acute findings.  Assessment and Plan:  #1 acute metabolic encephalopathy: Suspected seizure disorder leading to poor responsiveness and possibly passing out.  Patient also has worsening brain tumor with edema.  Will increase the dose of his Keppra to 1000 mg twice a day.   Decadron has been given.  Defer to neurology whether to continue with Decadron.  EEG also to be done.  MRI of the brain pending.  #2 brain tumor: Patient will follow-up with his oncologist at [facility] after discharge.  We will do the MRI here.  He has appointment scheduled next Friday.  #3 diabetes: Appears to be diet controlled.  A1c is 6.2.  Sliding scale insulin while in the hospital.  #4 hyperlipidemia: Patient unable to take p.o.'s at the moment.  When he does will be given his statin.  #5 hypokalemia: Repleted  #6 morbid obesity: Dietary counseling    Advance Care Planning:   Code Status: Full Code full code  Consults: Neurology Dr. Amada Jupiter  Family Communication: Wife at bedside  Severity of Illness: The appropriate patient status for this patient is OBSERVATION. Observation status is judged to be reasonable and necessary in order to provide the required intensity of service to ensure the patient's safety. The patient's presenting symptoms, physical exam findings, and initial radiographic and laboratory data in the context of their medical condition is felt to place them at decreased risk for further clinical deterioration. Furthermore, it is anticipated that the patient will be medically stable for discharge from the hospital within 2 midnights of admission.   AuthorLonia Blood, MD 04/26/2022 10:24 PM  For on call review [email or web address].

## 2022-04-26 NOTE — Plan of Care (Incomplete)
Overnight cross coverage note   Patient transferred from the outside ER to [facility].  Hospitalist admitting.  Seen by Dr. Amada Jupiter and emergent consultation this afternoon.  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  Sitting in the chair and slumped over became unresponsive.  No clear jerking activity but hard to arouse.  Initial recommendations to load antiepileptics, increase antiepileptic dose and transfer if not returning to baseline. On my brief examination, he is laying in bed comfortably, resists eye opening, withdraws appropriately to noxious stimulation in all fours.  Intermittently follows simple commands.  No gaze preference or deviation.  Brain imaging-CT head-done as a part of code stroke evaluation with comparison from CT head without contrast of January 26, 2022, shows interval increase in size of bilateral posterior hypodense lesion and associated edema compatible with progressive malignancy.  No acute hemorrhage.  Diffuse white matter hypoattenuation consistent with other known lesions as well as whole brain radiation sequela  Recommendations Continue Keppra 1 g twice daily EEG in the morning MRI brain with and without contrast Plan discussed with Dr. Mikeal Hawthorne  -- Milon Dikes, MD Neurologist [facility] Pager: [phone]

## 2022-04-26 NOTE — ED Notes (Signed)
Pt CBG 100. ED provider notified.

## 2022-04-26 NOTE — Consult Note (Signed)
[facility] Telemedicine Consult   Requesting Provider: Rancour, S Consult Participants: Patient, Nurse Location of the provider: [facility] Location of the patient: [facility]  This consult was provided via telemedicine with 2-way video and audio communication. The patient/family was informed that care would be provided in this way and agreed to receive care in this manner.    Chief Complaint: Decreased muscle mass  HPI: 72 year old male with a glioblastoma diagnosed in January who is undergoing treatment at [facility].  He has had seizures, which was how his tumor was initially found, and is on Keppra 750 twice daily.  He was sitting at church today, when he slumped over and became unresponsive.  His wife states that he did not have any clear jerking activity, he just was not responding with eyes closed, appearing asleep.  He began talking after about 10 minutes, but was still not following commands by the time of arrival to the emergency department.  Code stroke was activated.  From his glioblastoma, he has had progressive difficulties with his vision, and he states today that he feels like his vision is worse than it has been.  LKW: 10 am tpa given?: No, intracranial tumor IR Thrombectomy? No, no LVOTime of teleneurologist evaluation: 12:34  Exam: Vitals:   04/26/22 1211 04/26/22 1230  BP: (!) 143/79 (!) 153/73  Pulse: 77 67  Resp: 16 13  SpO2: 99% 95%    General: in bed  1A: Level of Consciousness - 0 1B: Ask Month and Age - 2 1C: 'Blink Eyes' & 'Squeeze Hands' - 0 2: Test Horizontal Extraocular Movements - 0 3: Test Visual Fields - 3 4: Test Facial Palsy - 1(decreased NL fold on the righ) 5A: Test Left Arm Motor Drift - 3 5B: Test Right Arm Motor Drift - 3 6A: Test Left Leg Motor Drift - 3 6B: Test Right Leg Motor Drift - 3 7: Test Limb Ataxia - 0 8: Test Sensation - 1(diminished on the left side) 9: Test Language/Aphasia- 0 10: Test Dysarthria -  0 11: Test Extinction/Inattention - 1 NIHSS score: 20   Imaging Reviewed: CT head-bilateral occipital hypodensities  Labs reviewed in epic and pertinent values follow: Sodium 138 Creatinine 1.0 Calcium 9.3 Ammonia 14 Ethanol less than 10  Assessment: 72 year old male with sudden onset unresponsiveness with gradual improvement.  Given his glioblastoma, as well as the time course, I think that seizure is by far most likely.  Differential would include syncope, but I think this is less likely given that he did not improve when laid down.  If he continues to improve and comes back to his normal self, the neck step would be increasing his Keppra I would increase to 1 g twice daily.  Recommendations:  1) increase Keppra to 1 g twice daily 2) if he continues to improve, no further recommendations at this time and he can follow-up with his outpatient neurologist. 3) if he does not return to baseline, would favor admission with MRI and EEG 4) please let neurology know if he is admitted.   Ritta Slot, MD [facility] [phone]  If 7pm- 7am, please page neurology on call as listed in AMION.

## 2022-04-27 ENCOUNTER — Observation Stay (HOSPITAL_COMMUNITY): Payer: Medicare Other

## 2022-04-27 ENCOUNTER — Inpatient Hospital Stay (HOSPITAL_COMMUNITY): Payer: Medicare Other

## 2022-04-27 DIAGNOSIS — E876 Hypokalemia: Secondary | ICD-10-CM

## 2022-04-27 DIAGNOSIS — E119 Type 2 diabetes mellitus without complications: Secondary | ICD-10-CM

## 2022-04-27 DIAGNOSIS — Z87891 Personal history of nicotine dependence: Secondary | ICD-10-CM | POA: Diagnosis not present

## 2022-04-27 DIAGNOSIS — I10 Essential (primary) hypertension: Secondary | ICD-10-CM | POA: Diagnosis not present

## 2022-04-27 DIAGNOSIS — C714 Malignant neoplasm of occipital lobe: Secondary | ICD-10-CM | POA: Diagnosis present

## 2022-04-27 DIAGNOSIS — J849 Interstitial pulmonary disease, unspecified: Secondary | ICD-10-CM | POA: Diagnosis present

## 2022-04-27 DIAGNOSIS — G9389 Other specified disorders of brain: Secondary | ICD-10-CM | POA: Diagnosis not present

## 2022-04-27 DIAGNOSIS — J4489 Other specified chronic obstructive pulmonary disease: Secondary | ICD-10-CM | POA: Diagnosis present

## 2022-04-27 DIAGNOSIS — Z7984 Long term (current) use of oral hypoglycemic drugs: Secondary | ICD-10-CM | POA: Diagnosis not present

## 2022-04-27 DIAGNOSIS — R569 Unspecified convulsions: Secondary | ICD-10-CM

## 2022-04-27 DIAGNOSIS — Z7982 Long term (current) use of aspirin: Secondary | ICD-10-CM | POA: Diagnosis not present

## 2022-04-27 DIAGNOSIS — Z8616 Personal history of COVID-19: Secondary | ICD-10-CM | POA: Diagnosis not present

## 2022-04-27 DIAGNOSIS — Z79899 Other long term (current) drug therapy: Secondary | ICD-10-CM | POA: Diagnosis not present

## 2022-04-27 DIAGNOSIS — G9341 Metabolic encephalopathy: Secondary | ICD-10-CM | POA: Diagnosis present

## 2022-04-27 DIAGNOSIS — Z8249 Family history of ischemic heart disease and other diseases of the circulatory system: Secondary | ICD-10-CM | POA: Diagnosis not present

## 2022-04-27 DIAGNOSIS — L89152 Pressure ulcer of sacral region, stage 2: Secondary | ICD-10-CM | POA: Diagnosis present

## 2022-04-27 DIAGNOSIS — E785 Hyperlipidemia, unspecified: Secondary | ICD-10-CM | POA: Diagnosis not present

## 2022-04-27 DIAGNOSIS — C719 Malignant neoplasm of brain, unspecified: Secondary | ICD-10-CM | POA: Insufficient documentation

## 2022-04-27 DIAGNOSIS — Z6838 Body mass index (BMI) 38.0-38.9, adult: Secondary | ICD-10-CM | POA: Diagnosis not present

## 2022-04-27 DIAGNOSIS — L8990 Pressure ulcer of unspecified site, unspecified stage: Secondary | ICD-10-CM | POA: Insufficient documentation

## 2022-04-27 DIAGNOSIS — Z923 Personal history of irradiation: Secondary | ICD-10-CM | POA: Diagnosis not present

## 2022-04-27 DIAGNOSIS — R4182 Altered mental status, unspecified: Secondary | ICD-10-CM | POA: Diagnosis present

## 2022-04-27 DIAGNOSIS — Z9884 Bariatric surgery status: Secondary | ICD-10-CM | POA: Diagnosis not present

## 2022-04-27 DIAGNOSIS — Z794 Long term (current) use of insulin: Secondary | ICD-10-CM

## 2022-04-27 DIAGNOSIS — G40909 Epilepsy, unspecified, not intractable, without status epilepticus: Secondary | ICD-10-CM | POA: Diagnosis present

## 2022-04-27 DIAGNOSIS — K219 Gastro-esophageal reflux disease without esophagitis: Secondary | ICD-10-CM | POA: Diagnosis present

## 2022-04-27 LAB — COMPREHENSIVE METABOLIC PANEL
ALT: 24 U/L (ref 0–44)
AST: 16 U/L (ref 15–41)
Albumin: 3.3 g/dL — ABNORMAL LOW (ref 3.5–5.0)
Alkaline Phosphatase: 37 U/L — ABNORMAL LOW (ref 38–126)
Anion gap: 13 (ref 5–15)
BUN: 16 mg/dL (ref 8–23)
CO2: 23 mmol/L (ref 22–32)
Calcium: 9.4 mg/dL (ref 8.9–10.3)
Chloride: 102 mmol/L (ref 98–111)
Creatinine, Ser: 0.84 mg/dL (ref 0.61–1.24)
GFR, Estimated: >60 mL/min (ref >60)
Glucose, Bld: 145 mg/dL — ABNORMAL HIGH (ref 70–99)
Potassium: 3.7 mmol/L (ref 3.5–5.1)
Sodium: 138 mmol/L (ref 135–145)
Total Bilirubin: 1.6 mg/dL — ABNORMAL HIGH (ref 0.3–1.2)
Total Protein: 6.1 g/dL — ABNORMAL LOW (ref 6.5–8.1)

## 2022-04-27 LAB — CBC
HCT: 38.8 % — ABNORMAL LOW (ref 39.0–52.0)
Hemoglobin: 13.3 g/dL (ref 13.0–17.0)
MCH: 28.9 pg (ref 26.0–34.0)
MCHC: 34.3 g/dL (ref 30.0–36.0)
MCV: 84.2 fL (ref 80.0–100.0)
Platelets: 193 10*3/uL (ref 150–400)
RBC: 4.61 MIL/uL (ref 4.22–5.81)
RDW: 15.3 % (ref 11.5–15.5)
WBC: 6.2 10*3/uL (ref 4.0–10.5)
nRBC: 0 % (ref 0.0–0.2)

## 2022-04-27 LAB — GLUCOSE, CAPILLARY
Glucose-Capillary: 121 mg/dL — ABNORMAL HIGH (ref 70–99)
Glucose-Capillary: 115 mg/dL — ABNORMAL HIGH (ref 70–99)
Glucose-Capillary: 156 mg/dL — ABNORMAL HIGH (ref 70–99)
Glucose-Capillary: 171 mg/dL — ABNORMAL HIGH (ref 70–99)

## 2022-04-27 MED ORDER — GADOBUTROL 1 MMOL/ML IV SOLN
10.0000 mL | Freq: Once | INTRAVENOUS | Status: AC | PRN
  Administered 2022-04-27: 10 mL via INTRAVENOUS

## 2022-04-27 MED ORDER — ORAL CARE MOUTH RINSE: 15.0000 mL | OROMUCOSAL | Status: DC | PRN

## 2022-04-27 MED ORDER — ACETAMINOPHEN 325 MG PO TABS
650.0000 mg | ORAL_TABLET | Freq: Four times a day (QID) | ORAL | Status: DC | PRN
  Administered 2022-04-27: 650 mg via ORAL
  Filled 2022-04-27: qty 2

## 2022-04-27 MED ORDER — DEXAMETHASONE SODIUM PHOSPHATE 4 MG/ML IJ SOLN
4.0000 mg | Freq: Four times a day (QID) | INTRAMUSCULAR | Status: DC
  Administered 2022-04-27 – 2022-04-28 (×6): 4 mg via INTRAVENOUS
  Filled 2022-04-27 (×6): qty 1

## 2022-04-27 MED ORDER — ORAL CARE MOUTH RINSE
15.0000 mL | OROMUCOSAL | Status: DC
  Administered 2022-04-27 – 2022-04-28 (×5): 15 mL via OROMUCOSAL

## 2022-04-27 MED ORDER — OXYCODONE HCL 5 MG PO TABS: 5.0000 mg | ORAL_TABLET | Freq: Four times a day (QID) | ORAL | Status: DC | PRN

## 2022-04-27 NOTE — Assessment & Plan Note (Addendum)
TSH, ammonia normal.  UA shows no infction.  CXR normal.  WBC normal.  Cr, BUN and Na normal, LFTs normal   - Obtain B12 - LTM EEG - Consult Neurology, appreciate cares - Have requested outside MRI from 3/27 be transferred to [facility] for overread and comparison

## 2022-04-27 NOTE — Progress Notes (Signed)
  Progress Note   Patient: [name] [name] ZOX:[id number] DOB: October 31, 1950 DOA: 04/26/2022     0 DOS: the patient was seen and examined on 04/27/2022 at 9:51AM and 2:00PM      Brief hospital course: [name] [name] is a 72 y.o. M with obesity, DM, GBM and seizures who presented with unresponsive episode.  Was at church, seemed sleepier than usual, then slumped over in the pew.  Was brought to ER in private vehicle, staff had to come out to car because he was unresponsive.  Admitted and remained sluggishly responsive.      Assessment and Plan: * Acute metabolic encephalopathy TSH, ammonia normal.  UA shows no infction.  CXR normal.  WBC normal.  Cr, BUN and Na normal, LFTs normal   - Obtain B12 - LTM EEG - Consult Neurology, appreciate cares - Have requested outside MRI from 3/27 be transferred to [facility] for overread and comparison     Glioblastoma - Outside records requested  Pressure injury of skin Sacrum, stage II, present on admission  Seizures - Continue Keppra - Consult Neuro - LTM EEG  Hypokalemia - Supplement K  Type 2 diabetes mellitus Glucose controlled, A1c 6.2% - Continue SS corrections  Morbid obesity BMI 38 with hypertension, hyperlipidemia and diabetes  HLD (hyperlipidemia) - Hold Lipitor  Essential (primary) hypertension Not on meds at home, BP normal          Subjective: Patient earlier this morning was extremely sluggish and poorly responsive, after placement of his EEG leads, his mentation returned to baseline.  He did report severe headache, and photophobia.  No generalized seizures, no vomiting, no respiratory symptoms     Physical Exam: BP 137/72 (BP Location: Left Arm)   Pulse 68   Temp 98.4 F (36.9 C)   Resp 18   Ht  (1.676 m)   Wt 108.5 kg   SpO2 93%   BMI 38.61 kg/m    Adult male, lying in bed, initially unresponsive, later alert and oriented and pleasant RRR, no murmurs, no peripheral edema Respiratory rate  normal, lungs clear without rales or wheezes Abdomen soft nontender to palpation or guarding Earlier the patient was limp, unresponsive, did not follow commands or make any spontaneous verbalizations or movements.  Later he was oriented to person place and time, pleasant, and follow commands without focal neurological deficits that I can appreciate    Data Reviewed: Gastroenterology Comprehensive metabolic panel normal Total bilirubin 1.6 MRI shows large multifocal bilateral glioblastoma CBC normal Urinalysis and chest x-ray are normal  Family Communication: Wife at the bedside    Disposition: Status is: Inpatient Patient will require further workup with long-term EEG        Author: Alberteen Sam, MD 04/27/2022 5:37 PM  For on call review [email or web address].

## 2022-04-27 NOTE — Progress Notes (Signed)
NEUROLOGY CONSULTATION PROGRESS NOTE   Date of service: April 27, 2022 Patient Name: [name] [name] MRN:  [id number] DOB:  April 24, 1950  Brief HPI  [name] [name] is a 72 y.o. 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 He has had seizures, which was how his tumor was initially found, and is on Keppra 750 twice daily. He was sitting at church on sunday when he slumped over and became unresponsive. Church members prevented him from falling to the ground.  Has been somnolent since then and wakes up a little. Initially presented to [facility] and transferred to [facility]. No obvious seizure like activity noted on exam.  MRI brain shows progression of Glioblastoma compared to images from Jan 2024 but he had images at [facility] in march which we do not have access to.  Wife reports team took him off Temodar about a month ago.   Interval Hx   Stll somnolent, wakes up to follow 1 step commands intermittently before closing his eyes again. Much of the history again obtained from widfe at bedside.  Vitals   Vitals:   04/26/22 2035 04/26/22 2331 04/27/22 0438 04/27/22 0811  BP: (!) 141/65 120/66 139/73 (!) 143/77  Pulse: (!) 56 61 61 61  Resp: Temp: 97.8 F (36.6 C) 97.9 F (36.6 C) (!) 97.5 F (36.4 C) (!) 97.5 F (36.4 C)  TempSrc: Oral Oral Oral Oral  SpO2: 93% 97% 93% 96%  Weight:   108.5 kg   Height:    (1.676 m)      Body mass index is 38.61 kg/m.  Physical Exam   General: Laying comfortably in bed; in no acute distress.  HENT: Normal oropharynx and mucosa. Normal external appearance of ears and nose.  Neck: Supple, no pain or tenderness  CV: No JVD. No peripheral edema.  Pulmonary: Symmetric Chest rise. Normal respiratory effort.  Abdomen: Soft to touch, non-tender.  Ext: No cyanosis, edema, or deformity  Skin: No rash. Normal palpation of skin.   Musculoskeletal: Normal digits and nails by inspection. No  clubbing.   Neurologic Examination  Mental status/Cognition: somnoent, partially opens eyes to loud voice and tactile stim but only briefly keeps eyes open and then closes and goes back to sleep. Poor attention and unable to answer any orientation questions. Speech/language: dysarthric. Non fluent. Intermittently follows 1 step commands. Cranial nerves:   CN II Pupils equal and reactive to light, unable to assess VF but wife reports progressive worsening vision since diagnosed with GBM.   CN III,IV,VI EOM intact, no gaze preference or deviation, no nystagmus.   CN V Corneals intact BL   CN VII Symmetric facial grimace.   CN VIII Turns head towards speech   CN IX & X Protecting his airway.   CN XI Head midline   CN XII midline tongue protrusion   Motor: Limited by significant lethargy and somnolence. Muscle bulk: Normal, tone normal Squeezes fingers on command with bilateral upper hands. Wiggle toes and commands in bilateral lower extremities.  Sensation:  Light touch    Pin prick Localizes to proximal pinch in all extremities.   Temperature    Vibration   Proprioception    Coordination/Complex Motor:  Unable to assess.  Labs   Basic Metabolic Panel:  Lab Results  Component Value Date   NA 138 04/27/2022   K 3.7 04/27/2022   CO2 23 04/27/2022   GLUCOSE 145 (H) 04/27/2022  BUN 16 04/27/2022   CREATININE 0.84 04/27/2022   CALCIUM 9.4 04/27/2022   GFRNONAA >60 04/27/2022   GFRAA  12/14/2008    >60        The eGFR has been calculated using the MDRD equation. This calculation has not been validated in all clinical situations. eGFR's persistently <60 mL/min signify possible Chronic Kidney Disease.   HbA1c:  Lab Results  Component Value Date   HGBA1C 6.2 (H) 04/26/2022   LDL: No results found for: "LDLCALC" Urine Drug Screen:     Component Value Date/Time   LABOPIA NONE DETECTED 04/26/2022 1826   COCAINSCRNUR NONE DETECTED 04/26/2022 1826   LABBENZ NONE  DETECTED 04/26/2022 1826   AMPHETMU NONE DETECTED 04/26/2022 1826   THCU NONE DETECTED 04/26/2022 1826   LABBARB NONE DETECTED 04/26/2022 1826    Alcohol Level     Component Value Date/Time   ETH <10 04/26/2022 1216   No results found for: "PHENYTOIN", "ZONISAMIDE", "LAMOTRIGINE", "LEVETIRACETA" No results found for: "PHENYTOIN", "PHENOBARB", "VALPROATE", "CBMZ"  Imaging and Diagnostic studies  Results for orders placed during the hospital encounter of 04/26/22  CT HEAD CODE STROKE WO CONTRAST`  Narrative CLINICAL DATA:  Code stroke. Slumped over in tricks this morning. Generalized weakness. GBM. Status post whole brain radiation.  EXAM: CT HEAD WITHOUT CONTRAST  TECHNIQUE: Contiguous axial images were obtained from the base of the skull through the vertex without intravenous contrast.  RADIATION DOSE REDUCTION: This exam was performed according to the departmental dose-optimization program which includes automated exposure control, adjustment of the mA and/or kV according to patient size and/or use of iterative reconstruction technique.  COMPARISON:  CT head without contrast 01/26/2022. MR head without and with contrast 01/26/2022.  FINDINGS: Brain: Multiple bilateral hypodense lesions are again noted. The largest right-sided lesion is in the high posterior parietal lobe measuring up to 2.6 cm, increased in size from prior studies. Surrounding edema has also increased. A lesion in the posterior left occipital lobe now measures 16 mm, increased in size. Surrounding vasogenic edema is noted.  Diffuse white matter hypoattenuation is consistent with other known lesions as well as whole-brain radiation. No other focal lesions are identified without IV contrast.  No acute hemorrhage present. The ventricles are of normal size. No significant extraaxial fluid collection is present. Brainstem is unremarkable. Midline structures are within normal limits.  Vascular: No  hyperdense vessel or unexpected calcification.  Skull: Right parietal burr hole is noted. Calvarium is otherwise within normal limits. No focal osseous lesions are present. The extracranial soft tissues are within normal limits.  Sinuses/Orbits: The paranasal sinuses and mastoid air cells are clear. The globes and orbits are within normal limits.  IMPRESSION: 1. Interval increase in size of bilateral posterior hypodense lesions and associated edema compatible with progressive malignancy. 2. No acute hemorrhage. 3. Diffuse white matter hypoattenuation is consistent with other known lesions as well as whole-brain radiation.  These results were called by telephone at the time of interpretation on 04/26/2022 at 12:34 pm to provider [facility] , who verbally acknowledged these results.   Electronically Signed By: Marin Roberts M.D. On: 04/26/2022 12:35   Impression   FOYE DAMRON is a 72 y.o. 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 He has had seizures, which was how his tumor was initially found, and is on Keppra 750 twice daily. He was sitting at church on sunday when he slumped over and became unresponsive. Church members  prevented him from falling to the ground. No obvious seizure activity but has been somnolent since.  Labs are non revealing. MRI brain concerning for progression of GBM since jan 2024 but hard to be sure since we do not have access to most recent images from Mar 2024.  Recommendations  - cont Dexamethasone 4mg  Q6H - LTM EEG for 24 hours to evaluate for subclinical seizures given persistent somnolence that was abrupt in onset on Sunday and hx of GBM. - Primary team to try to get most recent MRI pushed over from [facility]. - cont Keppra 1000mg  BID.  ______________________________________________________________________   Thank you for the opportunity to take part in the care of this  patient. If you have any further questions, please contact the neurology consultation attending.  Signed,  Erick Blinks [facility] Pager Number [phone]

## 2022-04-27 NOTE — Progress Notes (Signed)
LTM EEG running - no initial skin breakdown - push button tested - neuro notified. 

## 2022-04-27 NOTE — Assessment & Plan Note (Addendum)
BMI 38 with hypertension, hyperlipidemia and diabetes

## 2022-04-27 NOTE — Assessment & Plan Note (Signed)
-   Continue Keppra - Consult Neuro - LTM EEG

## 2022-04-27 NOTE — Progress Notes (Addendum)
  Transition of Care [facility]) Screening Note   Patient Details  Name: POWELL HALBERT Date of Birth: 08-21-1950   Transition of Care [facility]) CM/SW Contact:    Kermit Balo, RN Phone Number: 04/27/2022, 2:55 PM   Pt is from home with spouse. On LTEEG. Transition of Care Department [facility]) has reviewed patient. We will continue to monitor patient advancement through interdisciplinary progression rounds. If new patient transition needs arise, please place a TOC consult.

## 2022-04-27 NOTE — Consult Note (Signed)
WOC Nurse Consult Note: Reason for Consult:Stage 2 vs DTPI PI to sacrum Wound type:Pressure Pressure Injury POA: Yes Measurement:Per Nursing Flow Sheet, 2.25cm x 0.25 with no depth Wound ZOX:WRUE to deeper hues of purple, dry Drainage (amount, consistency, odor) None Periwound:intact Dressing procedure/placement/frequency: I will provide Nursing with guidance via the Orders for the care of this lesion using turning and repositioning, topical care with a xeroform gauze topped with a silicone foam dressing and frequent monitoring. Bilateral Prevalon boots are provided.  WOC nursing team will not follow, but will remain available to this patient, the nursing and medical teams.  Please re-consult if needed.  Thank you for inviting [place] to participate in this patient's Plan of Care.  Ladona Mow, MSN, RN, CNS, GNP, Leda Min, [employer], [employer] phone:  [phone]

## 2022-04-27 NOTE — Assessment & Plan Note (Signed)
Sacrum, stage II, present on admission

## 2022-04-27 NOTE — Hospital Course (Addendum)
[name] [name] is a 72 y.o. M with obesity, DM, GBM and seizures who presented with unresponsive episode.  Was at church, seemed sleepier than usual, then slumped over in the pew.  Was brought to ER in private vehicle, staff had to come out to car because he was unresponsive.  Admitted and remained sluggishly responsive.

## 2022-04-27 NOTE — Assessment & Plan Note (Signed)
Glucose controlled, A1c 6.2% - Continue SS corrections

## 2022-04-27 NOTE — Plan of Care (Signed)

## 2022-04-27 NOTE — Assessment & Plan Note (Signed)
Outside records requested

## 2022-04-27 NOTE — Assessment & Plan Note (Signed)
Not on meds at home, BP normal

## 2022-04-27 NOTE — Assessment & Plan Note (Signed)
-   Hold Lipitor 

## 2022-04-27 NOTE — Assessment & Plan Note (Signed)
Resolved with supplementation and starting spironolactone. 

## 2022-04-27 NOTE — Progress Notes (Signed)
Patient well Alert and oriented, following commands and NIH 0.

## 2022-04-28 ENCOUNTER — Other Ambulatory Visit: Payer: Self-pay

## 2022-04-28 ENCOUNTER — Other Ambulatory Visit (HOSPITAL_COMMUNITY): Payer: Self-pay

## 2022-04-28 DIAGNOSIS — R569 Unspecified convulsions: Secondary | ICD-10-CM | POA: Diagnosis not present

## 2022-04-28 DIAGNOSIS — G9341 Metabolic encephalopathy: Secondary | ICD-10-CM | POA: Diagnosis not present

## 2022-04-28 LAB — GLUCOSE, CAPILLARY
Glucose-Capillary: 143 mg/dL — ABNORMAL HIGH (ref 70–99)
Glucose-Capillary: 138 mg/dL — ABNORMAL HIGH (ref 70–99)
Glucose-Capillary: 149 mg/dL — ABNORMAL HIGH (ref 70–99)

## 2022-04-28 LAB — BILIRUBIN, FRACTIONATED(TOT/DIR/INDIR)
Bilirubin, Direct: 0.2 mg/dL (ref 0.0–0.2)
Indirect Bilirubin: 1.1 mg/dL — ABNORMAL HIGH (ref 0.3–0.9)
Total Bilirubin: 1.3 mg/dL — ABNORMAL HIGH (ref 0.3–1.2)

## 2022-04-28 LAB — VITAMIN B12: Vitamin B-12: 693 pg/mL (ref 180–914)

## 2022-04-28 MED ORDER — LEVETIRACETAM 1000 MG PO TABS
1000.0000 mg | ORAL_TABLET | Freq: Two times a day (BID) | ORAL | 1 refills | Status: DC
  Filled 2022-04-28: qty 60, 30d supply, fill #0

## 2022-04-28 MED ORDER — ONDANSETRON 4 MG PO TBDP
4.0000 mg | ORAL_TABLET | Freq: Four times a day (QID) | ORAL | 0 refills | Status: DC | PRN
Start: 2022-04-28 — End: 2022-08-12
  Filled 2022-04-28: qty 20, 5d supply, fill #0

## 2022-04-28 MED ORDER — DEXAMETHASONE 4 MG PO TABS
ORAL_TABLET | ORAL | 0 refills | Status: DC
  Filled 2022-04-28: qty 30, 21d supply, fill #0

## 2022-04-28 NOTE — Plan of Care (Signed)

## 2022-04-28 NOTE — Progress Notes (Signed)
LTM maint complete - no skin breakdown under: Fp2 Fp1 [facility] monitored, Event button test confirmed by [facility].  

## 2022-04-28 NOTE — TOC Transition Note (Signed)
Transition of Care [facility]) - CM/SW Discharge Note   Patient Details  Name: [name] [name] MRN: [id number] Date of Birth: 1950/06/28  Transition of Care [facility]) CM/SW Contact:  Kermit Balo, RN Phone Number: 04/28/2022, 3:01 PM   Clinical Narrative:    Pt is from home with his spouse. He was active with [facility] prior to admission and will continue with their services. Orders placed and information on the AVS. DME at home: walker/ walk in shower with bars Wife provides needed transportation and will transport him home today.  Wife over see his medications and denies any issues.  Pharmacy: [facility] in [place]--today his medications will be delivered to the room per [facility] pharmacy.    Final next level of care: Home w Home Health Services Barriers to Discharge: No Barriers Identified   Patient Goals and CMS Choice CMS Medicare.gov Compare Post Acute Care list provided to:: Patient Represenative (must comment) Choice offered to / list presented to : Spouse  Discharge Placement                         Discharge Plan and Services Additional resources added to the After Visit Summary for                            [facility] Arranged: PT, OT [facility] Agency: [facility] Date [facility] Agency Contacted: 04/28/22   Representative spoke with at [facility] Agency: Tresa Endo  Social Determinants of Health (SDOH) Interventions SDOH Screenings   Food Insecurity: No Food Insecurity (04/26/2022)  Housing: Low Risk  (04/26/2022)  Transportation Needs: No Transportation Needs (04/26/2022)  Utilities: Not At Risk (04/26/2022)  Depression (PHQ2-9): Low Risk  (02/10/2022)  Tobacco Use: Medium Risk (04/26/2022)     Readmission Risk Interventions     No data to display

## 2022-04-28 NOTE — Procedures (Addendum)
Patient Name: [name] [name]  MRN: [id number]  Epilepsy Attending: Charlsie Quest  Referring Physician/Provider: Erick Blinks, MD  Duration: 04/27/2022 1133 to 04/28/2022 1042  Patient history: 72 y.o. 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 He has had seizures, which was how his tumor was initially found, and is on Keppra 750 twice daily. He was sitting at church on sunday when he slumped over and became unresponsive. Church members prevented him from falling to the ground. No obvious seizure activity but has been somnolent since. EEG to evaluate for seizure  Level of alertness: Awake, asleep  AEDs during EEG study: LEV  Technical aspects: This EEG study was done with scalp electrodes positioned according to the 10-20 International system of electrode placement. Electrical activity was reviewed with band pass filter of 1-70Hz , sensitivity of 7 uV/mm, display speed of 73mm/sec with a  notched filter applied as appropriate. EEG data were recorded continuously and digitally stored.  Video monitoring was available and reviewed as appropriate.  Description: No clear posterior dominant rhythm was seen. Sleep was characterized by vertex waves, sleep spindles (12 to 14 Hz), maximal frontocentral region. EEG showed continuous generalized polymorphic 3 to 6 Hz theta-delta slowing. Hyperventilation and photic stimulation were not performed.     ABNORMALITY - Continuous slow, generalized  IMPRESSION: This study is suggestive of moderate diffuse encephalopathy. No seizures or epileptiform discharges were seen throughout the recording.  [name] Annabelle Harman

## 2022-04-28 NOTE — Evaluation (Signed)
Physical Therapy Evaluation Patient Details Name: [name] [name] MRN: [id number] DOB: 10/31/1950 Today's Date: 04/28/2022  History of Present Illness  Pt is a 72 y.o. M who presents 04/26/2022 with unresponsive episode. MRI brain concerning for progression of GBM since jan 2024 but also appears to have mildly progressed since MRI from March 2024 at [facility] when comparing images. Significant PMH: obesity, DM, GBM, seizures.  Clinical Impression  PTA, pt lives with his spouse who provides 24/7 supervision and assist, has recently started using a RW and participating in HHPT, and requires assist for some ADL's due to bilateral visual deficit. Pt overall requiring min-mod assist for functional mobility. Ambulating 35 ft with a walker with max multimodal cues for walker management and environmental navigation. Pt spouse reports pt mentation is close to his baseline and he has a fear of falling PTA. Pt will likely maneuver with increased ease in familiar environment. Pt spouse politely declining BSC or gait belt; they already have RW for mobility. Would recommend continued HHPT to address deficits.     Recommendations for follow up therapy are one component of a multi-disciplinary discharge planning process, led by the attending physician.  Recommendations may be updated based on patient status, additional functional criteria and insurance authorization.  Follow Up Recommendations       Assistance Recommended at Discharge Frequent or constant Supervision/Assistance  Patient can return home with the following  A little help with walking and/or transfers;A little help with bathing/dressing/bathroom;Assistance with cooking/housework;Direct supervision/assist for medications management;Assist for transportation;Help with stairs or ramp for entrance    Equipment Recommendations None recommended by PT  Recommendations for Other Services       Functional Status Assessment Patient has had a recent  decline in their functional status and demonstrates the ability to make significant improvements in function in a reasonable and predictable amount of time.     Precautions / Restrictions Precautions Precautions: Fall;Other (comment) Precaution Comments: bilateral visual deficit Restrictions Weight Bearing Restrictions: No      Mobility  Bed Mobility Overal bed mobility: Needs Assistance Bed Mobility: Supine to Sit, Sit to Supine     Supine to sit: Min guard Sit to supine: Min guard   General bed mobility comments: Cues for initiation and sequencing    Transfers Overall transfer level: Needs assistance Equipment used: Rolling walker (2 wheels) Transfers: Sit to/from Stand Sit to Stand: Min assist           General transfer comment: Light minA to rise and steady. Increased time    Ambulation/Gait Ambulation/Gait assistance: Min assist, Mod assist Gait Distance (Feet): 35 Feet Assistive device: Rolling walker (2 wheels) Gait Pattern/deviations: Step-through pattern, Decreased stride length, Trunk flexed, Shuffle Gait velocity: decreased Gait velocity interpretation: <1.31 ft/sec, indicative of household ambulator   General Gait Details: Max multimodal cues for walker proximity, environmental negotiation due to visual deficit, upright posture, and increased foot clearance. Shuffling gait with tendency to push walker too far forward. Min-modA for balance  Stairs            Wheelchair Mobility    Modified Rankin (Stroke Patients Only)       Balance Overall balance assessment: Needs assistance Sitting-balance support: Feet supported Sitting balance-Leahy Scale: Fair     Standing balance support: Bilateral upper extremity supported Standing balance-Leahy Scale: Poor Standing balance comment: reliant on external support  Pertinent Vitals/Pain Pain Assessment Pain Assessment: No/denies pain    Home Living  Family/patient expects to be discharged to:: Private residence Living Arrangements: Spouse/significant other Available Help at Discharge: Family Type of Home: House Home Access: Stairs to enter Entrance Stairs-Rails: Left Entrance Stairs-Number of Steps: 3 (in back)   Home Layout: One level Home Equipment: Grab bars - tub/shower;Rolling Environmental consultant (2 wheels)      Prior Function Prior Level of Function : Needs assist             Mobility Comments: recent began using RW and receiving HHPT, 1 recent fall at home ADLs Comments: requiring assist with dressing, IADL's     Hand Dominance        Extremity/Trunk Assessment   Upper Extremity Assessment Upper Extremity Assessment: Generalized weakness    Lower Extremity Assessment Lower Extremity Assessment: Generalized weakness    Cervical / Trunk Assessment Cervical / Trunk Assessment: Kyphotic  Communication   Communication: No difficulties  Cognition Arousal/Alertness: Awake/alert Behavior During Therapy: Flat affect Overall Cognitive Status: Impaired/Different from baseline Area of Impairment: Following commands, Problem solving                       Following Commands: Follows one step commands with increased time     Problem Solving: Difficulty sequencing, Requires verbal cues General Comments: Pt spouse reports close to baseline        General Comments      Exercises     Assessment/Plan    PT Assessment Patient needs continued PT services  PT Problem List Decreased strength;Decreased activity tolerance;Decreased balance;Decreased mobility;Decreased safety awareness       PT Treatment Interventions DME instruction;Gait training;Stair training;Functional mobility training;Therapeutic activities;Therapeutic exercise;Balance training;Patient/family education    PT Goals (Current goals can be found in the Care Plan section)  Acute Rehab PT Goals Patient Stated Goal: to go home PT Goal Formulation:  With patient/family Time For Goal Achievement: 05/12/22 Potential to Achieve Goals: Good    Frequency Min 3X/week     Co-evaluation               AM-PAC PT "6 Clicks" Mobility  Outcome Measure Help needed turning from your back to your side while in a flat bed without using bedrails?: A Little Help needed moving from lying on your back to sitting on the side of a flat bed without using bedrails?: A Little Help needed moving to and from a bed to a chair (including a wheelchair)?: A Little Help needed standing up from a chair using your arms (e.g., wheelchair or bedside chair)?: A Little Help needed to walk in hospital room?: A Lot Help needed climbing 3-5 steps with a railing? : A Lot 6 Click Score: 16    End of Session Equipment Utilized During Treatment: Gait belt Activity Tolerance: Patient tolerated treatment well Patient left: in bed;with bed alarm set;with family/visitor present;with call bell/phone within reach Nurse Communication: Mobility status PT Visit Diagnosis: Unsteadiness on feet (R26.81);History of falling (Z91.81);Difficulty in walking, not elsewhere classified (R26.2)    Time: 1610-9604 PT Time Calculation (min) (ACUTE ONLY): 31 min   Charges:   PT Evaluation $PT Eval Moderate Complexity: 1 Mod PT Treatments $Gait Training: 8-22 mins        Lillia Pauls, PT, DPT Acute Rehabilitation Services Office [phone]   Norval Morton 04/28/2022, 1:32 PM

## 2022-04-28 NOTE — Progress Notes (Signed)
LTM EEG discontinued, no skin breakdown at unhook. 

## 2022-04-28 NOTE — Progress Notes (Signed)
NEUROLOGY CONSULTATION PROGRESS NOTE   Date of service: April 28, 2022 Patient Name: [name] [name] MRN:  [id number] DOB:  1950-02-10  Brief HPI  [name] [name] is a 72 y.o. 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 He has had seizures, which was how his tumor was initially found, and is on Keppra 750 twice daily. He was sitting at church on sunday when he slumped over and became unresponsive. Church members prevented him from falling to the ground.  Has been somnolent since then and wakes up a little. Initially presented to [facility] and transferred to [facility]. No obvious seizure like activity noted on exam.  MRI brain shows progression of Glioblastoma compared to images from Jan 2024 but he had images at [facility] in march which we do not have access to.  Wife reports team took him off Temodar about a month ago.   Interval Hx   He is back to his baseline.  Vitals   Vitals:   04/27/22 2031 04/27/22 2330 04/28/22 0500 04/28/22 0743  BP: 125/60 129/68 (!) 148/65 (!) 123/56  Pulse: (!) 57 (!) 51 (!) 51 63  Resp: Temp: 98.3 F (36.8 C) 98.4 F (36.9 C) (!) 97.5 F (36.4 C) (!) 97.5 F (36.4 C)  TempSrc: Oral Oral Oral Oral  SpO2: 94% 96% 96% 95%  Weight:      Height:         Body mass index is 38.61 kg/m.  Physical Exam   General: Laying comfortably in bed; in no acute distress.  HENT: Normal oropharynx and mucosa. Normal external appearance of ears and nose.  Neck: Supple, no pain or tenderness  CV: No JVD. No peripheral edema.  Pulmonary: Symmetric Chest rise. Normal respiratory effort.  Abdomen: Soft to touch, non-tender.  Ext: No cyanosis, edema, or deformity  Skin: No rash. Normal palpation of skin.   Musculoskeletal: Normal digits and nails by inspection. No clubbing.   Neurologic Examination  Mental status/Cognition: alert, eyes open, oriented to self and place and to month. Speech/language:  fleunt, speaks in full sentences, follows commands and identifies objects. Cranial nerves:   CN II Pupils equal and reactive to light, endorses significantly decreased visual acuity since his diagnosis.   CN III,IV,VI EOM intact, no gaze preference or deviation, no nystagmus.   CN V Sensation intact on his face BL   CN VII Symmetric facial smile   CN VIII Turns head towards speech and answers questions.   CN IX & X Protecting his airway.   CN XI Head midline   CN XII midline tongue protrusion   Motor:  5/5 in BL upper extremities. Except for a bruise on his L upper arm which limits elbow flexion and extension and shoulder abduction due to pain. 5/5 in BL lower extremities.  Sensation:  Light touch Intact throughout   Pin prick    Temperature    Vibration   Proprioception    Coordination/Complex Motor:  Limited eval due to vision deficit. Able to touch his nose with his index finger without any prominent ataxia.  Labs   Basic Metabolic Panel:  Lab Results  Component Value Date   NA 138 04/27/2022   K 3.7 04/27/2022   CO2 23 04/27/2022   GLUCOSE 145 (H) 04/27/2022   BUN 16 04/27/2022   CREATININE 0.84 04/27/2022   CALCIUM 9.4 04/27/2022   GFRNONAA >60 04/27/2022   GFRAA  12/14/2008    >  60        The eGFR has been calculated using the MDRD equation. This calculation has not been validated in all clinical situations. eGFR's persistently <60 mL/min signify possible Chronic Kidney Disease.   HbA1c:  Lab Results  Component Value Date   HGBA1C 6.2 (H) 04/26/2022   LDL: No results found for: "LDLCALC" Urine Drug Screen:     Component Value Date/Time   LABOPIA NONE DETECTED 04/26/2022 1826   COCAINSCRNUR NONE DETECTED 04/26/2022 1826   LABBENZ NONE DETECTED 04/26/2022 1826   AMPHETMU NONE DETECTED 04/26/2022 1826   THCU NONE DETECTED 04/26/2022 1826   LABBARB NONE DETECTED 04/26/2022 1826    Alcohol Level     Component Value Date/Time   ETH <10 04/26/2022  1216   No results found for: "PHENYTOIN", "ZONISAMIDE", "LAMOTRIGINE", "LEVETIRACETA" No results found for: "PHENYTOIN", "PHENOBARB", "VALPROATE", "CBMZ"  Imaging and Diagnostic studies   MRI Brain w + w/o C: 1. Multifocal and Bilateral Glioblastoma with progression since 01/26/2022 - especially in the right parietal, occipital and posterior temporal lobes. This includes the tail of the right hippocampus. 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 Right upper mid brain involvement by tumor is slightly more conspicuous.   2. No midline shift or significant intracranial mass effect at this time. No diffusion abnormality suggestive of acute infarct.   Impression   [name] [name] is a 72 y.o. 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 He has had seizures, which was how his tumor was initially found, and is on Keppra 750 twice daily. He was sitting at church on sunday when he slumped over and became unresponsive. No obvious seizure activity but has been somnolent since. Labs are non revealing. MRI brain concerning for progression of GBM since jan 2024 but also appears to have mildly progressed since MRI from March 2024 at [facility] when comparing images.  Recommendations  - cont Dexamethasone  Q6H - discontinue LTM if no seizures. - cont Keppra  BID. - follow up with his team at [facility] outpatient.  ______________________________________________________________________   Thank you for the opportunity to take part in the care of this patient. If you have any further questions, please contact the neurology consultation attending.  Signed,  Erick Blinks [facility] Pager Number [phone]

## 2022-04-28 NOTE — Progress Notes (Signed)
Leads maint. no skin breakdown at fp1, t7, f7

## 2022-04-28 NOTE — Discharge Summary (Signed)
Physician Discharge Summary   Patient: [name] [name] MRN: [id number] DOB: 04/19/1950  Admit date:     04/26/2022  Discharge date: 04/28/22  Discharge Physician: Alberteen Sam   PCP: Lonie Peak, PA-C     Recommendations at discharge:  Follow up with Dr. 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 M with obesity, DM, GBM and seizures who presented with unresponsive episode.  Was at church, seemed sleepier than usual, then slumped over in the pew.  Was brought to ER in private vehicle, staff had to come out to car because he was unresponsive.  Admitted and remained sluggishly responsive.     * Acute metabolic encephalopathy TSH, ammonia normal.  UA acellular, bland.  CXR normal.  WBC normal.  Cr, BUN and Na normal, LFTs normal.  B12 normal.  No ingestions, new medications.  Overnight, patient remained densely encephalopathic, unresponsive, and flaccid.  In the morning, while putting on his EEG leads, he abruptly woke up and was back to baseline.  Over next 24 hours, he had some waxing and waning somnolence, but worked with PT and was able to participate in self-cares.  LTM EEG obtained, showed continuous generalized slowing, no focality.  MRI brain with and without contrast was obtained and compared directly with MRI from [facility] from 3/27 ([facility] from [facility]) and showed no changes to enhancing lesions, only limited advancement of nonenhancing disease.  Given normal metabolic work up, absence of seizure activity and marked tumur burden on imaging, have to assume his symptoms are simply progression of his tumor.  Patient  discharged on dexamethasone taper again, recommend close follow up with Neuro-oncology for prognosis, treatment vs Hospice.     Pressure injury of skin Sacrum, stage II, present on admission  Seizures Keppra continued.    Hypokalemia  Type 2 diabetes mellitus  Morbid obesity BMI 38 with hypertension, hyperlipidemia and diabetes  HLD (hyperlipidemia)  Essential (primary) hypertension Not on meds at home, BP normal            The [facility] Controlled Substances Registry was reviewed for this patient prior to discharge.  Consultants: Neurology Procedures performed: MRI brain with and without (see below), continuous EEG  Disposition: Home health Diet recommendation:  Carb modified diet  DISCHARGE MEDICATION: Allergies as of 04/28/2022       Reactions   Mercury Swelling        Medication List     STOP taking these medications    metFORMIN 500 MG 24 hr tablet Commonly known as: GLUCOPHAGE-XR   OneTouch Verio test strip Generic drug: glucose blood   SM Fish Oil 1000 MG Caps       TAKE these medications    accu-chek multiclix lancets   albuterol 108 (90 Base) MCG/ACT inhaler Commonly known as: VENTOLIN HFA Inhale 2 puffs into the lungs every 4 (four) hours as needed for shortness of breath.   aspirin EC 81 MG tablet Take 81 mg by mouth daily.   atorvastatin 20 MG tablet Commonly known as: LIPITOR Take 20 mg by mouth daily.   BARIATRIC MULTIVITAMINS/IRON PO Take 1 tablet  by mouth daily.   Calcium Citrate-Vitamin D 500-12.5 MG-MCG Chew Chew 1 tablet by mouth 3 (three) times daily.   dexamethasone 4 MG tablet Commonly known as: DECADRON Take dexamethasone 4 mg (1 tab) three times daily for 3 days, then take two times daily for 3 days, then take daily until you see Dr. Burna Forts What changed:  medication strength how much to take how to take this when to take this additional instructions   Fifty50 Glucose Meter 2.0 w/Device Kit Use  as instructed   ibuprofen 800 MG tablet Commonly known as: ADVIL Take 800 mg by mouth 3 (three) times daily.   levETIRAcetam 1000 MG tablet Commonly known as: KEPPRA Take 1 tablet (1,000 mg total) by mouth 2 (two) times daily. What changed:  medication strength how much to take   omeprazole 40 MG capsule Commonly known as: PRILOSEC Take 40 mg by mouth daily.   ondansetron 4 MG disintegrating tablet Commonly known as: ZOFRAN-ODT Dissolve 1 tablet (4 mg total) by mouth every 6 hours as needed for nausea or vomiting.   ondansetron 8 MG tablet Commonly known as: ZOFRAN Take 8 mg by mouth daily.   oxyCODONE 5 MG immediate release tablet Commonly known as: Oxy IR/ROXICODONE Take by mouth.   oxyCODONE-acetaminophen 5-325 MG tablet Commonly known as: PERCOCET/ROXICET Take 1 tablet by mouth every 6 (six) hours as needed for severe pain.   pantoprazole 20 MG tablet Commonly known as: PROTONIX Take 20 mg by mouth daily.   polyethylene glycol 17 g packet Commonly known as: MIRALAX / GLYCOLAX Take 17 g by mouth daily as needed for moderate constipation.   QUEtiapine 25 MG tablet Commonly known as: SEROQUEL Take by mouth.        Follow-up Information     Lajoyce Corners, MD. Go to.   Specialty: Neurology Contact information: [address] [place] [address] [phone]         [facility] Follow up.   Specialty: Home Health Services Why: The home health agency will contact you for the next home visit. Contact information: [address] [place] [address] [phone]                 Discharge Instructions     Increase activity slowly   Complete by: As directed    No wound care   Complete by: As directed        Discharge Exam: Filed Weights   04/27/22 0438  Weight: 108.5 kg    General: Pt is alert, awake, not in acute distress Cardiovascular: RRR, nl S1-S2, no murmurs appreciated.   No LE edema.    Respiratory: Normal respiratory rate and rhythm.  CTAB without rales or wheezes. Abdominal: Abdomen soft and non-tender.  No distension or HSM.   Neuro/Psych: Strength symmetric in upper and lower extremities.  Judgment and insight appear mildly impaired.   Condition at discharge: Fair  The results of significant diagnostics from this hospitalization (including imaging, microbiology, ancillary and laboratory) are listed below for reference.   Imaging Studies: MR BRAIN W WO CONTRAST  Addendum Date: 04/28/2022   ADDENDUM REPORT: 04/28/2022 14:29 ADDENDUM: More recent outside restaging MRI dated 04/01/2022 now available in [facility] PACS Comparison with that exam reveals: - mildly increased tumor like FLAIR hyperintensity at the left insula (series 11, image 13 versus series 5, image 16 on 04/01/2022). - similar mildly increased T2/FLAIR hyperintensity in the superior right parietal lobe (series 11, image 23 versus series  5, image 28). - no significant change in multifocal right greater than left posterior hemisphere tumor enhancement. CONCLUSION: When compared to more recent 04/01/2022 restaging exam, there has been slight progression of non-enhancing tumor visible by on FLAIR. The enhancing component of the multifocal tumor appears stable. This was discussed by telephone with Dr. Ival Bible on 04/28/2022 at 14:28 . Electronically Signed   By: Odessa Fleming M.D.   On: 04/28/2022 14:29   Result Date: 04/28/2022 CLINICAL DATA:  72 year old male with glioblastoma treated at [facility]. Code stroke presentation, seizure like activity. History of whole brain radiation. EXAM: MRI HEAD WITHOUT AND WITH CONTRAST TECHNIQUE: Multiplanar, multiecho pulse sequences of the brain and surrounding structures were obtained without and with intravenous contrast. CONTRAST:  10mL GADAVIST GADOBUTROL 1 MMOL/ML IV SOLN COMPARISON:  CT head and CTA head and neck yesterday. Most recent available outside restaging MRI  is 01/26/2022 (no images on a scan from 04/03/2022). FINDINGS: Brain: No restricted diffusion suggestive of acute infarction. Multifocal or multicentric infiltrating tumor affecting both cerebral hemispheres and tracking into the right brainstem via the midbrain. Progressive masslike T2 and FLAIR hyperintensity in the right inferior parietal lobe and occipital lobe contiguous with the right splenium of the corpus callosum since 01/26/2022 (series 11, image 17). This includes the posterior right temporal lobe and progression at the tail of the right hippocampus on series 11, image 14. Additionally, thin slice coronal T2 and FLAIR imaging is provided. These demonstrate the signal abnormality in the region of the tail of the right hippocampus, and also the contralateral abnormal left temporal lobe. Comparing both sides, the body of the left hippocampus is more T2 hyperintense on series 18, image 17. There is no hippocampal restricted diffusion identified. Contralateral confluent T2 and FLAIR hyperintensity throughout much of the left temporal lobe was present in January, but may be mildly progressed at the level of the posterior insula on series 11, image 13. A more discrete partially cystic mass in the left occipital lobe is slightly larger on series 11, image 12. T2 and FLAIR hyperintensity tracking from the posterior right deep gray nuclei into the right midbrain was present but is more conspicuous, possibly extending as far as the lateral right pons now on series 11, image 10. Following contrast there is increased confluence of peripherally enhancing centrally cystic or necrotic tumor in the right splenium and parietal lobe, occipital lobe along the parieto-occipital sulcus (series 20, image 35 now versus series 12, image 87 in January). And there are new ring-enhancing lesions tracking into the posterior right temporal lobe (series 20, image 23 now). The cystic lesion in the left occipital lobe rim enhances as  before. No discrete enhancement in the left temporal lobe, hippocampus. No dural thickening or enhancement identified. No midline shift, ventriculomegaly, extra-axial collection or acute intracranial hemorrhage. Cervicomedullary junction and pituitary are within normal limits. Vascular: Major intracranial vascular flow voids are stable. Following contrast the major dural venous sinuses are enhancing and appear to be patent. Skull and upper cervical spine: Sequelae of posterior right convexity craniotomy or burr hole. Normal background bone marrow signal. Negative visible cervical spine and spinal cord. Sinuses/Orbits: Negative. Other: Mastoids are well aerated. Visible internal auditory structures appear normal. IMPRESSION: 1. Multifocal and Bilateral Glioblastoma with progression since 01/26/2022 - especially in the right parietal, occipital and posterior temporal lobes. This includes the tail of the right hippocampus. However, there is abundant non-enhancing contralateral Left Temporal Lobe Tumor, and the body of the Left Hippocampus  is asymmetrically T2 hyperintense. Right upper mid brain involvement by tumor is slightly more conspicuous. 2. No midline shift or significant intracranial mass effect at this time. No diffusion abnormality suggestive of acute infarct. Electronically Signed: By: Odessa Fleming M.D. On: 04/27/2022 04:38   Overnight EEG with video  Result Date: 04/28/2022 Charlsie Quest, MD     04/28/2022 12:26 PM Patient Name: [name] [name] MRN: [id number] Epilepsy Attending: Charlsie Quest Referring Physician/Provider: Erick Blinks, MD Duration: 04/27/2022 1133 to 04/28/2022 1042 Patient history: 72 y.o. male with PMH significant for glioblastoma diagnosed in January who is undergoing treatment at [facility]. He has had seizures, which was how his tumor was initially found, and is on Keppra 750 twice daily. He was sitting at church on sunday when he slumped over and became unresponsive.  Church members prevented him from falling to the ground. No obvious seizure activity but has been somnolent since. EEG to evaluate for seizure Level of alertness: Awake, asleep AEDs during EEG study: LEV Technical aspects: This EEG study was done with scalp electrodes positioned according to the 10-20 International system of electrode placement. Electrical activity was reviewed with band pass filter of 1-70Hz , sensitivity of 7 uV/mm, display speed of 86mm/sec with a  notched filter applied as appropriate. EEG data were recorded continuously and digitally stored.  Video monitoring was available and reviewed as appropriate. Description: No clear posterior dominant rhythm was seen. Sleep was characterized by vertex waves, sleep spindles (12 to 14 Hz), maximal frontocentral region. EEG showed continuous generalized polymorphic 3 to 6 Hz theta-delta slowing. Hyperventilation and photic stimulation were not performed.   ABNORMALITY - Continuous slow, generalized IMPRESSION: This study is suggestive of moderate diffuse encephalopathy. No seizures or epileptiform discharges were seen throughout the recording. Priyanka Annabelle Harman   CT ANGIO HEAD NECK W WO CM (CODE STROKE)  Result Date: 04/26/2022 CLINICAL DATA:  Neuro deficit, acute, stroke suspected. Patient slumped over in car. Family reports intermittent consciousness. Known GBM. Status post whole brain radiation. EXAM: CT ANGIOGRAPHY HEAD AND NECK WITH AND WITHOUT CONTRAST TECHNIQUE: Multidetector CT imaging of the head and neck was performed using the standard protocol during bolus administration of intravenous contrast. Multiplanar CT image reconstructions and MIPs were obtained to evaluate the vascular anatomy. Carotid stenosis measurements (when applicable) are obtained utilizing NASCET criteria, using the distal internal carotid diameter as the denominator. RADIATION DOSE REDUCTION: This exam was performed according to the departmental dose-optimization program  which includes automated exposure control, adjustment of the mA and/or kV according to patient size and/or use of iterative reconstruction technique. CONTRAST:  80mL OMNIPAQUE IOHEXOL 350 MG/ML SOLN COMPARISON:  CT head without contrast 04/31/2024 FINDINGS: CTA NECK FINDINGS Aortic arch: Atherosclerotic calcifications are present in the distal aortic arch origins great vessels without focal stenosis or aneurysm. Right carotid system: Right common carotid artery within normal limits. Calcifications present bifurcation without significant stenosis. The cervical right ICA is otherwise normal. Left carotid system: The left common carotid artery is within normal limits. Calcifications are present at the proximal left ICA. No significant stenosis is present. The cervical left ICA is otherwise within normal limits. Vertebral arteries: The right vertebral artery is the dominant vessel. Both vertebral arteries originate from the subclavian arteries without significant stenosis. No significant stenosis is present in either vertebral artery in the neck. Skeleton: Vertebral body heights and alignment are normal. No focal osseous lesions are present. The patient is edentulous. Other neck: Soft tissues the neck are otherwise unremarkable.  Salivary glands are within normal limits. Thyroid is normal. No significant adenopathy is present. No focal mucosal or submucosal lesions are present. Upper chest: Centrilobular emphysematous changes are present. No focal nodule, mass or airspace disease is present. Review of the MIP images confirms the above findings CTA HEAD FINDINGS Anterior circulation: The internal carotid arteries are within normal limits the skull base through the ICA termini. The right A1 segment is aplastic. Both A2 segments fill from the left. M1 segments are within normal limits. Mild segmental narrowing is present in the distal ACA and MCA branch vessels without a significant proximal stenosis or occlusion. No  aneurysm is present. Posterior circulation: Right vertebral artery is the dominant vessel. PICA origin is visualized and normal bilaterally. Vertebrobasilar junction and basilar artery is normal. Superior cerebellar arteries are visualized. The left posterior cerebral artery denotes from basilar tip. The right posterior cerebral artery is of fetal type. A small right P1 segment is present. The PCA branch vessels are normal bilaterally. Venous sinuses: The dural sinuses are patent. The straight sinus and deep cerebral veins are intact. Cortical veins are within normal limits. No significant vascular malformation is evident. Anatomic variants: Fetal type right posterior cerebral artery. Review of the MIP images confirms the above findings IMPRESSION: 1. Mild segmental narrowing in the distal ACA and MCA branch vessels without a significant proximal stenosis or occlusion. 2. Atherosclerotic changes at the carotid bifurcations bilaterally without significant stenosis. 3. Normal variant CTA Circle of Willis without significant proximal stenosis, aneurysm, or branch vessel occlusion. 4. Aortic Atherosclerosis (ICD10-I70.0) and Emphysema (ICD10-J43.9). Electronically Signed   By: Marin Roberts M.D.   On: 04/26/2022 13:50   DG Chest Portable 1 View  Result Date: 04/26/2022 CLINICAL DATA:  Altered mental status EXAM: PORTABLE CHEST 1 VIEW COMPARISON:  Chest x-ray 04/18/2022 FINDINGS: The cardiac silhouette, mediastinal and hilar contours are within normal limits and stable. No acute pulmonary findings. No pleural effusions or pneumothorax. The bony thorax is intact. IMPRESSION: No acute cardiopulmonary findings. Electronically Signed   By: Rudie Meyer M.D.   On: 04/26/2022 13:04   CT HEAD CODE STROKE WO CONTRAST`  Result Date: 04/26/2022 CLINICAL DATA:  Code stroke. Slumped over in tricks this morning. Generalized weakness. GBM. Status post whole brain radiation. EXAM: CT HEAD WITHOUT CONTRAST TECHNIQUE:  Contiguous axial images were obtained from the base of the skull through the vertex without intravenous contrast. RADIATION DOSE REDUCTION: This exam was performed according to the departmental dose-optimization program which includes automated exposure control, adjustment of the mA and/or kV according to patient size and/or use of iterative reconstruction technique. COMPARISON:  CT head without contrast 01/26/2022. MR head without and with contrast 01/26/2022. FINDINGS: Brain: Multiple bilateral hypodense lesions are again noted. The largest right-sided lesion is in the high posterior parietal lobe measuring up to 2.6 cm, increased in size from prior studies. Surrounding edema has also increased. A lesion in the posterior left occipital lobe now measures 16 mm, increased in size. Surrounding vasogenic edema is noted. Diffuse white matter hypoattenuation is consistent with other known lesions as well as whole-brain radiation. No other focal lesions are identified without IV contrast. No acute hemorrhage present. The ventricles are of normal size. No significant extraaxial fluid collection is present. Brainstem is unremarkable. Midline structures are within normal limits. Vascular: No hyperdense vessel or unexpected calcification. Skull: Right parietal burr hole is noted. Calvarium is otherwise within normal limits. No focal osseous lesions are present. The extracranial soft tissues are within normal  limits. Sinuses/Orbits: The paranasal sinuses and mastoid air cells are clear. The globes and orbits are within normal limits. IMPRESSION: 1. Interval increase in size of bilateral posterior hypodense lesions and associated edema compatible with progressive malignancy. 2. No acute hemorrhage. 3. Diffuse white matter hypoattenuation is consistent with other known lesions as well as whole-brain radiation. These results were called by telephone at the time of interpretation on 04/26/2022 at 12:34 pm to provider [facility] , who verbally acknowledged these results. Electronically Signed   By: Marin Roberts M.D.   On: 04/26/2022 12:35   DG Knee Complete 4 Views Right  Result Date: 04/18/2022 CLINICAL DATA:  Fall with right knee pain. EXAM: RIGHT KNEE - COMPLETE 4+ VIEW COMPARISON:  None Available. FINDINGS: No evidence of fracture, dislocation, or joint effusion. Mild degenerative changes are present in the patellofemoral compartment. Soft tissues are unremarkable. IMPRESSION: No acute fracture or dislocation. Electronically Signed   By: Thornell Sartorius M.D.   On: 04/18/2022 20:23   DG Elbow Complete Left  Result Date: 04/18/2022 CLINICAL DATA:  Fall EXAM: LEFT ELBOW - COMPLETE 3+ VIEW COMPARISON:  None Available. FINDINGS: There is no evidence of fracture, dislocation, or joint effusion. There is no evidence of arthropathy or other focal bone abnormality. Soft tissues are unremarkable. IMPRESSION: Negative. Electronically Signed   By: Tish Frederickson M.D.   On: 04/18/2022 18:13   DG Humerus Left  Result Date: 04/18/2022 CLINICAL DATA:  Fall EXAM: LEFT HUMERUS - 2+ VIEW COMPARISON:  None Available. FINDINGS: There is no evidence of fracture or other focal bone lesions. Soft tissues are unremarkable. IMPRESSION: Negative. Electronically Signed   By: Tish Frederickson M.D.   On: 04/18/2022 18:08   DG Ribs Unilateral W/Chest Left  Result Date: 04/18/2022 CLINICAL DATA:  Fall EXAM: LEFT RIBS AND CHEST - 3+ VIEW COMPARISON:  Chest x-ray 06/27/2022 FINDINGS: The heart and mediastinal contours are unchanged. Atherosclerotic plaque. Left base atelectasis. No focal consolidation. No pulmonary edema. No pleural effusion. No pneumothorax. BB marker overlies the left chest. Acute minimally displaced lateral eighth and ninth rib fractures. Surgical tacks overlie the abdomen consistent with a hernia repair. IMPRESSION: 1. Acute minimally displaced lateral eighth and ninth rib fractures. No associated pneumothorax. 2. No  acute cardiopulmonary abnormality. 3.  Aortic Atherosclerosis (ICD10-I70.0). Electronically Signed   By: Tish Frederickson M.D.   On: 04/18/2022 18:08   DG Shoulder Left  Result Date: 04/18/2022 CLINICAL DATA:  Fall EXAM: LEFT SHOULDER - 2+ VIEW COMPARISON:  None Available. FINDINGS: There is no evidence of fracture or dislocation. Acromioclavicular joint degenerative changes. Soft tissues are unremarkable. Atherosclerotic plaque. IMPRESSION: 1. No acute displaced fracture or dislocation. 2.  Aortic Atherosclerosis (ICD10-I70.0). Electronically Signed   By: Tish Frederickson M.D.   On: 04/18/2022 18:04   CT ABDOMEN PELVIS WO CONTRAST  Result Date: 04/18/2022 CLINICAL DATA:  Flank pain after fall last Sunday. EXAM: CT ABDOMEN AND PELVIS WITHOUT CONTRAST TECHNIQUE: Multidetector CT imaging of the abdomen and pelvis was performed following the standard protocol without IV contrast. RADIATION DOSE REDUCTION: This exam was performed according to the departmental dose-optimization program which includes automated exposure control, adjustment of the mA and/or kV according to patient size and/or use of iterative reconstruction technique. COMPARISON:  CT abdomen pelvis dated December 12, 2008. FINDINGS: Lower chest: No acute abnormality. Minimal subsegmental atelectasis at the lung bases. Hepatobiliary: No focal liver abnormality is seen. Status post cholecystectomy. No biliary dilatation. Pancreas: Unremarkable. No pancreatic ductal dilatation or surrounding  inflammatory changes. Spleen: Normal in size without focal abnormality. Adrenals/Urinary Tract: Adrenal glands are unremarkable. 0.8 cm simple cyst in the midpole of the right kidney has decreased in size since 2010. New small dystrophic calcification along the posterior margin of the cyst. New left renal parapelvic simple cyst. No follow-up imaging is recommended. No renal calculi or hydronephrosis. The bladder is unremarkable. 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 No bowel wall thickening, distention, or surrounding inflammatory changes. Normal appendix. Vascular/Lymphatic: Aortic atherosclerosis. No enlarged abdominal or pelvic lymph nodes. Reproductive: Prostate is unremarkable. Other: Prior ventral hernia repair. No free fluid or pneumoperitoneum. Musculoskeletal: No acute or significant osseous findings. IMPRESSION: 1. No acute intra-abdominal process. 2.  Aortic Atherosclerosis (ICD10-I70.0). Electronically Signed   By: Obie Dredge M.D.   On: 04/18/2022 17:18    Microbiology: Results for orders placed or performed during the hospital encounter of 04/26/22  Blood culture (routine x 2)     Status: None (Preliminary result)   Collection Time: 04/26/22 12:09 PM   Specimen: BLOOD  Result Value Ref Range Status   Specimen Description   Final    BLOOD LEFT ANTECUBITAL Performed at [facility], [address] [place], [place] [address]    Special Requests   Final    BOTTLES DRAWN AEROBIC AND ANAEROBIC Blood Culture results may not be optimal due to an inadequate volume of blood received in culture bottles Performed at [facility], [address] [place], [place] [address]    Culture   Final    NO GROWTH 2 DAYS Performed at [facility], [address], [place], [place] [address]    Report Status PENDING  Incomplete  Blood culture (routine x 2)     Status: None (Preliminary result)   Collection Time: 04/26/22  2:42 PM   Specimen: BLOOD  Result Value Ref Range Status   Specimen Description   Final    BLOOD RIGHT ANTECUBITAL Performed at [facility], [address] [place], [place] [address]    Special Requests   Final    BOTTLES DRAWN AEROBIC AND ANAEROBIC Blood Culture adequate volume Performed at [facility], [address] [place], [place] [address]    Culture   Final    NO GROWTH 2 DAYS Performed at [facility], [address]  [place], [place] [address]    Report Status PENDING  Incomplete    Labs: CBC: Recent Labs  Lab 04/26/22 1218 04/26/22 1234 04/26/22 2116 04/27/22 0333  WBC 10.3  --  6.7 6.2  NEUTROABS 5.6  --   --   --   HGB 13.5 13.6 13.3 13.3  HCT 40.8 40.0 39.1 38.8*  MCV 85.5  --  83.9 84.2  PLT 200  --  198 193   Basic Metabolic Panel: Recent Labs  Lab 04/26/22 1209 04/26/22 1234 04/26/22 2116 04/27/22 0333  NA 138 141  --  138  K 3.1* 3.2*  --  3.7  CL 103  --   --  102  CO2 23  --   --  23  GLUCOSE 105*  --   --  145*  BUN 27*  --   --  16  CREATININE 1.00  --  0.86 0.84  CALCIUM 9.3  --   --  9.4   Liver Function Tests: Recent Labs  Lab 04/26/22 1209 04/27/22 0333 04/28/22 0453  AST 22 16  --  ALT 21 24  --   ALKPHOS 39 37*  --   BILITOT 1.8* 1.6* 1.3*  PROT 7.2 6.1*  --   ALBUMIN 3.9 3.3*  --    CBG: Recent Labs  Lab 04/27/22 1113 04/27/22 1538 04/27/22 2105 04/28/22 0610 04/28/22 1108  GLUCAP 115* 156* 171* 143* 138*    Discharge time spent: approximately 35 minutes spent on discharge counseling, evaluation of patient on day of discharge, and coordination of discharge planning with nursing, social work, pharmacy and case management  Signed: Alberteen Sam, MD [facility] 04/28/2022

## 2022-04-30 DIAGNOSIS — C7931 Secondary malignant neoplasm of brain: Secondary | ICD-10-CM | POA: Diagnosis not present

## 2022-04-30 DIAGNOSIS — N529 Male erectile dysfunction, unspecified: Secondary | ICD-10-CM | POA: Diagnosis not present

## 2022-04-30 DIAGNOSIS — Z6834 Body mass index (BMI) 34.0-34.9, adult: Secondary | ICD-10-CM | POA: Diagnosis not present

## 2022-04-30 DIAGNOSIS — J309 Allergic rhinitis, unspecified: Secondary | ICD-10-CM | POA: Diagnosis not present

## 2022-04-30 DIAGNOSIS — E119 Type 2 diabetes mellitus without complications: Secondary | ICD-10-CM | POA: Diagnosis not present

## 2022-04-30 DIAGNOSIS — M720 Palmar fascial fibromatosis [Dupuytren]: Secondary | ICD-10-CM | POA: Diagnosis not present

## 2022-04-30 DIAGNOSIS — I10 Essential (primary) hypertension: Secondary | ICD-10-CM | POA: Diagnosis not present

## 2022-04-30 DIAGNOSIS — C710 Malignant neoplasm of cerebrum, except lobes and ventricles: Secondary | ICD-10-CM | POA: Diagnosis not present

## 2022-04-30 DIAGNOSIS — Z87891 Personal history of nicotine dependence: Secondary | ICD-10-CM | POA: Diagnosis not present

## 2022-04-30 DIAGNOSIS — M1712 Unilateral primary osteoarthritis, left knee: Secondary | ICD-10-CM | POA: Diagnosis not present

## 2022-04-30 DIAGNOSIS — E7800 Pure hypercholesterolemia, unspecified: Secondary | ICD-10-CM | POA: Diagnosis not present

## 2022-04-30 DIAGNOSIS — Z9181 History of falling: Secondary | ICD-10-CM | POA: Diagnosis not present

## 2022-04-30 DIAGNOSIS — R569 Unspecified convulsions: Secondary | ICD-10-CM | POA: Diagnosis not present

## 2022-05-01 DIAGNOSIS — K319 Disease of stomach and duodenum, unspecified: Secondary | ICD-10-CM | POA: Diagnosis not present

## 2022-05-01 DIAGNOSIS — I6789 Other cerebrovascular disease: Secondary | ICD-10-CM | POA: Diagnosis not present

## 2022-05-01 DIAGNOSIS — C719 Malignant neoplasm of brain, unspecified: Secondary | ICD-10-CM | POA: Diagnosis not present

## 2022-05-01 DIAGNOSIS — R569 Unspecified convulsions: Secondary | ICD-10-CM | POA: Diagnosis not present

## 2022-05-01 DIAGNOSIS — Y842 Radiological procedure and radiotherapy as the cause of abnormal reaction of the patient, or of later complication, without mention of misadventure at the time of the procedure: Secondary | ICD-10-CM | POA: Diagnosis not present

## 2022-05-01 LAB — CULTURE, BLOOD (ROUTINE X 2)
Culture: NO GROWTH
Culture: NO GROWTH
Special Requests: ADEQUATE

## 2022-05-04 ENCOUNTER — Telehealth: Payer: Self-pay | Admitting: Radiation Oncology

## 2022-05-04 NOTE — Telephone Encounter (Signed)
4/29 @ 9:44 am spoke to [facility]) for recent brain images from [facility] to be transfer to patient's timeline in epic.

## 2022-05-05 ENCOUNTER — Telehealth: Payer: Self-pay

## 2022-05-05 ENCOUNTER — Ambulatory Visit: Payer: Medicare Other | Admitting: Radiation Oncology

## 2022-05-05 NOTE — Telephone Encounter (Signed)
Noticed patient was not checked in for his 3pm F/U with Dr. Basilio Cairo. Called both patient and patient's wife, but neither answered. Left VM on patient's number requesting return call to discuss rescheduling F/U appointment

## 2022-05-06 ENCOUNTER — Telehealth: Payer: Self-pay

## 2022-05-06 NOTE — Telephone Encounter (Signed)
RN called pt/family to let them know we were thinking about them (with pt new course of treatment related to his brain cancer). Rn also encouraged pt/ family that they were in great hands with Dr. Burna Forts (per Dr. Basilio Cairo). Pt wife reports they missed the follow up appointment yesterday due to a family emergency (the had a loved on in the hospital). Rn encouraged pt /family to reach out with any needs or concerns. Pt wife was very grateful for the thoughts and concern.

## 2022-05-08 DIAGNOSIS — Z6836 Body mass index (BMI) 36.0-36.9, adult: Secondary | ICD-10-CM | POA: Diagnosis not present

## 2022-05-08 DIAGNOSIS — E7800 Pure hypercholesterolemia, unspecified: Secondary | ICD-10-CM | POA: Diagnosis not present

## 2022-05-08 DIAGNOSIS — E119 Type 2 diabetes mellitus without complications: Secondary | ICD-10-CM | POA: Diagnosis not present

## 2022-05-08 DIAGNOSIS — N529 Male erectile dysfunction, unspecified: Secondary | ICD-10-CM | POA: Diagnosis not present

## 2022-05-08 DIAGNOSIS — I6789 Other cerebrovascular disease: Secondary | ICD-10-CM | POA: Diagnosis not present

## 2022-05-08 DIAGNOSIS — C719 Malignant neoplasm of brain, unspecified: Secondary | ICD-10-CM | POA: Diagnosis not present

## 2022-05-08 DIAGNOSIS — M720 Palmar fascial fibromatosis [Dupuytren]: Secondary | ICD-10-CM | POA: Diagnosis not present

## 2022-05-08 DIAGNOSIS — C710 Malignant neoplasm of cerebrum, except lobes and ventricles: Secondary | ICD-10-CM | POA: Diagnosis not present

## 2022-05-08 DIAGNOSIS — E876 Hypokalemia: Secondary | ICD-10-CM | POA: Diagnosis not present

## 2022-05-08 DIAGNOSIS — K219 Gastro-esophageal reflux disease without esophagitis: Secondary | ICD-10-CM | POA: Diagnosis not present

## 2022-05-08 DIAGNOSIS — J309 Allergic rhinitis, unspecified: Secondary | ICD-10-CM | POA: Diagnosis not present

## 2022-05-08 DIAGNOSIS — Y842 Radiological procedure and radiotherapy as the cause of abnormal reaction of the patient, or of later complication, without mention of misadventure at the time of the procedure: Secondary | ICD-10-CM | POA: Diagnosis not present

## 2022-05-08 DIAGNOSIS — C713 Malignant neoplasm of parietal lobe: Secondary | ICD-10-CM | POA: Diagnosis not present

## 2022-05-08 DIAGNOSIS — G4733 Obstructive sleep apnea (adult) (pediatric): Secondary | ICD-10-CM | POA: Diagnosis not present

## 2022-05-08 DIAGNOSIS — Z7952 Long term (current) use of systemic steroids: Secondary | ICD-10-CM | POA: Diagnosis not present

## 2022-05-08 DIAGNOSIS — Z5111 Encounter for antineoplastic chemotherapy: Secondary | ICD-10-CM | POA: Diagnosis not present

## 2022-05-08 DIAGNOSIS — G40909 Epilepsy, unspecified, not intractable, without status epilepticus: Secondary | ICD-10-CM | POA: Diagnosis not present

## 2022-05-08 DIAGNOSIS — J849 Interstitial pulmonary disease, unspecified: Secondary | ICD-10-CM | POA: Diagnosis not present

## 2022-05-08 DIAGNOSIS — I10 Essential (primary) hypertension: Secondary | ICD-10-CM | POA: Diagnosis not present

## 2022-05-08 DIAGNOSIS — M1712 Unilateral primary osteoarthritis, left knee: Secondary | ICD-10-CM | POA: Diagnosis not present

## 2022-05-09 DIAGNOSIS — C710 Malignant neoplasm of cerebrum, except lobes and ventricles: Secondary | ICD-10-CM | POA: Diagnosis not present

## 2022-05-09 DIAGNOSIS — J849 Interstitial pulmonary disease, unspecified: Secondary | ICD-10-CM | POA: Diagnosis not present

## 2022-05-09 DIAGNOSIS — E876 Hypokalemia: Secondary | ICD-10-CM | POA: Diagnosis not present

## 2022-05-09 DIAGNOSIS — Z7952 Long term (current) use of systemic steroids: Secondary | ICD-10-CM | POA: Diagnosis not present

## 2022-05-09 DIAGNOSIS — E119 Type 2 diabetes mellitus without complications: Secondary | ICD-10-CM | POA: Diagnosis not present

## 2022-05-09 DIAGNOSIS — E7800 Pure hypercholesterolemia, unspecified: Secondary | ICD-10-CM | POA: Diagnosis not present

## 2022-05-09 DIAGNOSIS — M720 Palmar fascial fibromatosis [Dupuytren]: Secondary | ICD-10-CM | POA: Diagnosis not present

## 2022-05-09 DIAGNOSIS — N529 Male erectile dysfunction, unspecified: Secondary | ICD-10-CM | POA: Diagnosis not present

## 2022-05-09 DIAGNOSIS — G4733 Obstructive sleep apnea (adult) (pediatric): Secondary | ICD-10-CM | POA: Diagnosis not present

## 2022-05-09 DIAGNOSIS — K219 Gastro-esophageal reflux disease without esophagitis: Secondary | ICD-10-CM | POA: Diagnosis not present

## 2022-05-09 DIAGNOSIS — J309 Allergic rhinitis, unspecified: Secondary | ICD-10-CM | POA: Diagnosis not present

## 2022-05-09 DIAGNOSIS — I10 Essential (primary) hypertension: Secondary | ICD-10-CM | POA: Diagnosis not present

## 2022-05-09 DIAGNOSIS — Z6836 Body mass index (BMI) 36.0-36.9, adult: Secondary | ICD-10-CM | POA: Diagnosis not present

## 2022-05-09 DIAGNOSIS — G40909 Epilepsy, unspecified, not intractable, without status epilepticus: Secondary | ICD-10-CM | POA: Diagnosis not present

## 2022-05-09 DIAGNOSIS — M1712 Unilateral primary osteoarthritis, left knee: Secondary | ICD-10-CM | POA: Diagnosis not present

## 2022-05-12 DIAGNOSIS — M720 Palmar fascial fibromatosis [Dupuytren]: Secondary | ICD-10-CM | POA: Diagnosis not present

## 2022-05-12 DIAGNOSIS — K219 Gastro-esophageal reflux disease without esophagitis: Secondary | ICD-10-CM | POA: Diagnosis not present

## 2022-05-12 DIAGNOSIS — Z7952 Long term (current) use of systemic steroids: Secondary | ICD-10-CM | POA: Diagnosis not present

## 2022-05-12 DIAGNOSIS — G4733 Obstructive sleep apnea (adult) (pediatric): Secondary | ICD-10-CM | POA: Diagnosis not present

## 2022-05-12 DIAGNOSIS — G40909 Epilepsy, unspecified, not intractable, without status epilepticus: Secondary | ICD-10-CM | POA: Diagnosis not present

## 2022-05-12 DIAGNOSIS — E7800 Pure hypercholesterolemia, unspecified: Secondary | ICD-10-CM | POA: Diagnosis not present

## 2022-05-12 DIAGNOSIS — I10 Essential (primary) hypertension: Secondary | ICD-10-CM | POA: Diagnosis not present

## 2022-05-12 DIAGNOSIS — C710 Malignant neoplasm of cerebrum, except lobes and ventricles: Secondary | ICD-10-CM | POA: Diagnosis not present

## 2022-05-12 DIAGNOSIS — J849 Interstitial pulmonary disease, unspecified: Secondary | ICD-10-CM | POA: Diagnosis not present

## 2022-05-12 DIAGNOSIS — M1712 Unilateral primary osteoarthritis, left knee: Secondary | ICD-10-CM | POA: Diagnosis not present

## 2022-05-12 DIAGNOSIS — E876 Hypokalemia: Secondary | ICD-10-CM | POA: Diagnosis not present

## 2022-05-12 DIAGNOSIS — E119 Type 2 diabetes mellitus without complications: Secondary | ICD-10-CM | POA: Diagnosis not present

## 2022-05-12 DIAGNOSIS — N529 Male erectile dysfunction, unspecified: Secondary | ICD-10-CM | POA: Diagnosis not present

## 2022-05-12 DIAGNOSIS — Z6836 Body mass index (BMI) 36.0-36.9, adult: Secondary | ICD-10-CM | POA: Diagnosis not present

## 2022-05-12 DIAGNOSIS — J309 Allergic rhinitis, unspecified: Secondary | ICD-10-CM | POA: Diagnosis not present

## 2022-05-20 DIAGNOSIS — E119 Type 2 diabetes mellitus without complications: Secondary | ICD-10-CM | POA: Diagnosis not present

## 2022-05-20 DIAGNOSIS — S060X0A Concussion without loss of consciousness, initial encounter: Secondary | ICD-10-CM | POA: Diagnosis not present

## 2022-05-20 DIAGNOSIS — S0083XA Contusion of other part of head, initial encounter: Secondary | ICD-10-CM | POA: Diagnosis not present

## 2022-05-20 DIAGNOSIS — R404 Transient alteration of awareness: Secondary | ICD-10-CM | POA: Diagnosis not present

## 2022-05-20 DIAGNOSIS — M79602 Pain in left arm: Secondary | ICD-10-CM | POA: Diagnosis not present

## 2022-05-20 DIAGNOSIS — S61212S Laceration without foreign body of right middle finger without damage to nail, sequela: Secondary | ICD-10-CM | POA: Diagnosis not present

## 2022-05-20 DIAGNOSIS — M19012 Primary osteoarthritis, left shoulder: Secondary | ICD-10-CM | POA: Diagnosis not present

## 2022-05-20 DIAGNOSIS — M25512 Pain in left shoulder: Secondary | ICD-10-CM | POA: Diagnosis not present

## 2022-05-20 DIAGNOSIS — W19XXXA Unspecified fall, initial encounter: Secondary | ICD-10-CM | POA: Diagnosis not present

## 2022-05-20 DIAGNOSIS — S61212A Laceration without foreign body of right middle finger without damage to nail, initial encounter: Secondary | ICD-10-CM | POA: Diagnosis not present

## 2022-05-20 DIAGNOSIS — R42 Dizziness and giddiness: Secondary | ICD-10-CM | POA: Diagnosis not present

## 2022-05-20 DIAGNOSIS — R55 Syncope and collapse: Secondary | ICD-10-CM | POA: Diagnosis not present

## 2022-05-20 DIAGNOSIS — I10 Essential (primary) hypertension: Secondary | ICD-10-CM | POA: Diagnosis not present

## 2022-05-20 DIAGNOSIS — S0003XA Contusion of scalp, initial encounter: Secondary | ICD-10-CM | POA: Diagnosis not present

## 2022-05-21 ENCOUNTER — Ambulatory Visit: Payer: Medicare Other | Admitting: Dietician

## 2022-05-22 DIAGNOSIS — C716 Malignant neoplasm of cerebellum: Secondary | ICD-10-CM | POA: Diagnosis not present

## 2022-05-22 DIAGNOSIS — Z8669 Personal history of other diseases of the nervous system and sense organs: Secondary | ICD-10-CM | POA: Diagnosis not present

## 2022-05-22 DIAGNOSIS — R1084 Generalized abdominal pain: Secondary | ICD-10-CM | POA: Diagnosis not present

## 2022-05-22 DIAGNOSIS — G9389 Other specified disorders of brain: Secondary | ICD-10-CM | POA: Diagnosis not present

## 2022-05-22 DIAGNOSIS — G9340 Encephalopathy, unspecified: Secondary | ICD-10-CM | POA: Diagnosis not present

## 2022-05-22 DIAGNOSIS — Z6830 Body mass index (BMI) 30.0-30.9, adult: Secondary | ICD-10-CM | POA: Diagnosis not present

## 2022-05-22 DIAGNOSIS — R4182 Altered mental status, unspecified: Secondary | ICD-10-CM | POA: Diagnosis not present

## 2022-05-22 DIAGNOSIS — Z87891 Personal history of nicotine dependence: Secondary | ICD-10-CM | POA: Diagnosis not present

## 2022-05-22 DIAGNOSIS — R569 Unspecified convulsions: Secondary | ICD-10-CM | POA: Diagnosis not present

## 2022-05-22 DIAGNOSIS — R627 Adult failure to thrive: Secondary | ICD-10-CM | POA: Diagnosis not present

## 2022-05-22 DIAGNOSIS — C718 Malignant neoplasm of overlapping sites of brain: Secondary | ICD-10-CM | POA: Diagnosis not present

## 2022-05-22 DIAGNOSIS — I10 Essential (primary) hypertension: Secondary | ICD-10-CM | POA: Diagnosis not present

## 2022-05-22 DIAGNOSIS — E43 Unspecified severe protein-calorie malnutrition: Secondary | ICD-10-CM | POA: Diagnosis not present

## 2022-05-22 DIAGNOSIS — C719 Malignant neoplasm of brain, unspecified: Secondary | ICD-10-CM | POA: Diagnosis not present

## 2022-05-22 DIAGNOSIS — M6281 Muscle weakness (generalized): Secondary | ICD-10-CM | POA: Diagnosis not present

## 2022-05-22 DIAGNOSIS — R531 Weakness: Secondary | ICD-10-CM | POA: Diagnosis not present

## 2022-05-22 DIAGNOSIS — W19XXXA Unspecified fall, initial encounter: Secondary | ICD-10-CM | POA: Diagnosis not present

## 2022-05-22 DIAGNOSIS — E119 Type 2 diabetes mellitus without complications: Secondary | ICD-10-CM | POA: Diagnosis not present

## 2022-05-28 DIAGNOSIS — Z8669 Personal history of other diseases of the nervous system and sense organs: Secondary | ICD-10-CM | POA: Diagnosis not present

## 2022-05-28 DIAGNOSIS — R569 Unspecified convulsions: Secondary | ICD-10-CM | POA: Diagnosis not present

## 2022-05-28 DIAGNOSIS — G893 Neoplasm related pain (acute) (chronic): Secondary | ICD-10-CM | POA: Diagnosis not present

## 2022-05-28 DIAGNOSIS — R531 Weakness: Secondary | ICD-10-CM | POA: Diagnosis not present

## 2022-05-28 DIAGNOSIS — C719 Malignant neoplasm of brain, unspecified: Secondary | ICD-10-CM | POA: Diagnosis not present

## 2022-05-28 DIAGNOSIS — Z7189 Other specified counseling: Secondary | ICD-10-CM | POA: Diagnosis not present

## 2022-05-28 DIAGNOSIS — F19959 Other psychoactive substance use, unspecified with psychoactive substance-induced psychotic disorder, unspecified: Secondary | ICD-10-CM | POA: Diagnosis not present

## 2022-05-28 DIAGNOSIS — R627 Adult failure to thrive: Secondary | ICD-10-CM | POA: Diagnosis not present

## 2022-05-28 DIAGNOSIS — E119 Type 2 diabetes mellitus without complications: Secondary | ICD-10-CM | POA: Diagnosis not present

## 2022-05-28 DIAGNOSIS — M6281 Muscle weakness (generalized): Secondary | ICD-10-CM | POA: Diagnosis not present

## 2022-05-28 DIAGNOSIS — C716 Malignant neoplasm of cerebellum: Secondary | ICD-10-CM | POA: Diagnosis not present

## 2022-06-01 DIAGNOSIS — Z7189 Other specified counseling: Secondary | ICD-10-CM | POA: Diagnosis not present

## 2022-06-01 DIAGNOSIS — R569 Unspecified convulsions: Secondary | ICD-10-CM | POA: Diagnosis not present

## 2022-06-01 DIAGNOSIS — C719 Malignant neoplasm of brain, unspecified: Secondary | ICD-10-CM | POA: Diagnosis not present

## 2022-06-01 DIAGNOSIS — F19959 Other psychoactive substance use, unspecified with psychoactive substance-induced psychotic disorder, unspecified: Secondary | ICD-10-CM | POA: Diagnosis not present

## 2022-06-03 DIAGNOSIS — R569 Unspecified convulsions: Secondary | ICD-10-CM | POA: Diagnosis not present

## 2022-06-03 DIAGNOSIS — C719 Malignant neoplasm of brain, unspecified: Secondary | ICD-10-CM | POA: Diagnosis not present

## 2022-06-03 DIAGNOSIS — R531 Weakness: Secondary | ICD-10-CM | POA: Diagnosis not present

## 2022-06-03 DIAGNOSIS — R627 Adult failure to thrive: Secondary | ICD-10-CM | POA: Diagnosis not present

## 2022-06-04 DIAGNOSIS — F19959 Other psychoactive substance use, unspecified with psychoactive substance-induced psychotic disorder, unspecified: Secondary | ICD-10-CM | POA: Diagnosis not present

## 2022-06-04 DIAGNOSIS — G893 Neoplasm related pain (acute) (chronic): Secondary | ICD-10-CM | POA: Diagnosis not present

## 2022-06-04 DIAGNOSIS — R531 Weakness: Secondary | ICD-10-CM | POA: Diagnosis not present

## 2022-06-08 DIAGNOSIS — C719 Malignant neoplasm of brain, unspecified: Secondary | ICD-10-CM | POA: Diagnosis not present

## 2022-06-08 DIAGNOSIS — R569 Unspecified convulsions: Secondary | ICD-10-CM | POA: Diagnosis not present

## 2022-06-08 DIAGNOSIS — E785 Hyperlipidemia, unspecified: Secondary | ICD-10-CM | POA: Diagnosis not present

## 2022-06-08 DIAGNOSIS — G893 Neoplasm related pain (acute) (chronic): Secondary | ICD-10-CM | POA: Diagnosis not present

## 2022-06-12 DIAGNOSIS — I6789 Other cerebrovascular disease: Secondary | ICD-10-CM | POA: Diagnosis not present

## 2022-06-12 DIAGNOSIS — C719 Malignant neoplasm of brain, unspecified: Secondary | ICD-10-CM | POA: Diagnosis not present

## 2022-06-12 DIAGNOSIS — Y842 Radiological procedure and radiotherapy as the cause of abnormal reaction of the patient, or of later complication, without mention of misadventure at the time of the procedure: Secondary | ICD-10-CM | POA: Diagnosis not present

## 2022-06-18 DIAGNOSIS — R569 Unspecified convulsions: Secondary | ICD-10-CM | POA: Diagnosis not present

## 2022-06-18 DIAGNOSIS — C719 Malignant neoplasm of brain, unspecified: Secondary | ICD-10-CM | POA: Diagnosis not present

## 2022-06-18 DIAGNOSIS — G893 Neoplasm related pain (acute) (chronic): Secondary | ICD-10-CM | POA: Diagnosis not present

## 2022-06-18 DIAGNOSIS — E785 Hyperlipidemia, unspecified: Secondary | ICD-10-CM | POA: Diagnosis not present

## 2022-07-13 DIAGNOSIS — C719 Malignant neoplasm of brain, unspecified: Secondary | ICD-10-CM | POA: Diagnosis not present

## 2022-07-13 DIAGNOSIS — R0902 Hypoxemia: Secondary | ICD-10-CM | POA: Diagnosis not present

## 2022-07-13 DIAGNOSIS — J90 Pleural effusion, not elsewhere classified: Secondary | ICD-10-CM | POA: Diagnosis not present

## 2022-07-13 DIAGNOSIS — Z4659 Encounter for fitting and adjustment of other gastrointestinal appliance and device: Secondary | ICD-10-CM | POA: Diagnosis not present

## 2022-07-13 DIAGNOSIS — R17 Unspecified jaundice: Secondary | ICD-10-CM | POA: Diagnosis not present

## 2022-07-13 DIAGNOSIS — E872 Acidosis, unspecified: Secondary | ICD-10-CM | POA: Diagnosis not present

## 2022-07-13 DIAGNOSIS — R6521 Severe sepsis with septic shock: Secondary | ICD-10-CM | POA: Diagnosis not present

## 2022-07-13 DIAGNOSIS — R55 Syncope and collapse: Secondary | ICD-10-CM | POA: Diagnosis not present

## 2022-07-13 DIAGNOSIS — R4182 Altered mental status, unspecified: Secondary | ICD-10-CM | POA: Diagnosis not present

## 2022-07-13 DIAGNOSIS — J9600 Acute respiratory failure, unspecified whether with hypoxia or hypercapnia: Secondary | ICD-10-CM | POA: Diagnosis not present

## 2022-07-13 DIAGNOSIS — A419 Sepsis, unspecified organism: Secondary | ICD-10-CM | POA: Diagnosis not present

## 2022-07-13 DIAGNOSIS — R404 Transient alteration of awareness: Secondary | ICD-10-CM | POA: Diagnosis not present

## 2022-07-13 DIAGNOSIS — N179 Acute kidney failure, unspecified: Secondary | ICD-10-CM | POA: Diagnosis not present

## 2022-07-13 DIAGNOSIS — G9340 Encephalopathy, unspecified: Secondary | ICD-10-CM | POA: Diagnosis not present

## 2022-07-14 DIAGNOSIS — Z4659 Encounter for fitting and adjustment of other gastrointestinal appliance and device: Secondary | ICD-10-CM | POA: Diagnosis not present

## 2022-07-14 DIAGNOSIS — R9401 Abnormal electroencephalogram [EEG]: Secondary | ICD-10-CM | POA: Diagnosis not present

## 2022-07-14 DIAGNOSIS — Z8669 Personal history of other diseases of the nervous system and sense organs: Secondary | ICD-10-CM | POA: Diagnosis not present

## 2022-07-14 DIAGNOSIS — Z4682 Encounter for fitting and adjustment of non-vascular catheter: Secondary | ICD-10-CM | POA: Diagnosis not present

## 2022-07-14 DIAGNOSIS — C719 Malignant neoplasm of brain, unspecified: Secondary | ICD-10-CM | POA: Diagnosis not present

## 2022-07-14 DIAGNOSIS — E872 Acidosis, unspecified: Secondary | ICD-10-CM | POA: Diagnosis not present

## 2022-07-14 DIAGNOSIS — N179 Acute kidney failure, unspecified: Secondary | ICD-10-CM | POA: Diagnosis not present

## 2022-07-14 DIAGNOSIS — G9340 Encephalopathy, unspecified: Secondary | ICD-10-CM | POA: Diagnosis not present

## 2022-07-14 DIAGNOSIS — J9601 Acute respiratory failure with hypoxia: Secondary | ICD-10-CM | POA: Diagnosis not present

## 2022-07-14 DIAGNOSIS — G9341 Metabolic encephalopathy: Secondary | ICD-10-CM | POA: Diagnosis not present

## 2022-07-14 DIAGNOSIS — R7881 Bacteremia: Secondary | ICD-10-CM | POA: Diagnosis not present

## 2022-07-15 DIAGNOSIS — E872 Acidosis, unspecified: Secondary | ICD-10-CM | POA: Diagnosis not present

## 2022-07-15 DIAGNOSIS — G9341 Metabolic encephalopathy: Secondary | ICD-10-CM | POA: Diagnosis not present

## 2022-07-15 DIAGNOSIS — R7881 Bacteremia: Secondary | ICD-10-CM | POA: Diagnosis not present

## 2022-07-15 DIAGNOSIS — N179 Acute kidney failure, unspecified: Secondary | ICD-10-CM | POA: Diagnosis not present

## 2022-07-16 DIAGNOSIS — R000 Tachycardia, unspecified: Secondary | ICD-10-CM | POA: Diagnosis not present

## 2022-07-16 DIAGNOSIS — R7881 Bacteremia: Secondary | ICD-10-CM | POA: Diagnosis not present

## 2022-07-16 DIAGNOSIS — R4182 Altered mental status, unspecified: Secondary | ICD-10-CM | POA: Diagnosis not present

## 2022-07-17 DIAGNOSIS — C718 Malignant neoplasm of overlapping sites of brain: Secondary | ICD-10-CM | POA: Diagnosis not present

## 2022-07-17 DIAGNOSIS — G9341 Metabolic encephalopathy: Secondary | ICD-10-CM | POA: Diagnosis not present

## 2022-07-18 DIAGNOSIS — R7881 Bacteremia: Secondary | ICD-10-CM | POA: Diagnosis not present

## 2022-07-19 DIAGNOSIS — G9341 Metabolic encephalopathy: Secondary | ICD-10-CM | POA: Diagnosis not present

## 2022-07-20 DIAGNOSIS — G9341 Metabolic encephalopathy: Secondary | ICD-10-CM | POA: Diagnosis not present

## 2022-07-20 DIAGNOSIS — Z923 Personal history of irradiation: Secondary | ICD-10-CM | POA: Diagnosis not present

## 2022-07-20 DIAGNOSIS — R4182 Altered mental status, unspecified: Secondary | ICD-10-CM | POA: Diagnosis not present

## 2022-07-20 DIAGNOSIS — Z85841 Personal history of malignant neoplasm of brain: Secondary | ICD-10-CM | POA: Diagnosis not present

## 2022-07-20 DIAGNOSIS — N179 Acute kidney failure, unspecified: Secondary | ICD-10-CM | POA: Diagnosis not present

## 2022-07-20 DIAGNOSIS — Z9889 Other specified postprocedural states: Secondary | ICD-10-CM | POA: Diagnosis not present

## 2022-07-20 DIAGNOSIS — J9600 Acute respiratory failure, unspecified whether with hypoxia or hypercapnia: Secondary | ICD-10-CM | POA: Diagnosis not present

## 2022-07-21 DIAGNOSIS — R930 Abnormal findings on diagnostic imaging of skull and head, not elsewhere classified: Secondary | ICD-10-CM | POA: Diagnosis not present

## 2022-07-21 DIAGNOSIS — C719 Malignant neoplasm of brain, unspecified: Secondary | ICD-10-CM | POA: Diagnosis not present

## 2022-07-21 DIAGNOSIS — J9600 Acute respiratory failure, unspecified whether with hypoxia or hypercapnia: Secondary | ICD-10-CM | POA: Diagnosis not present

## 2022-07-21 DIAGNOSIS — N179 Acute kidney failure, unspecified: Secondary | ICD-10-CM | POA: Diagnosis not present

## 2022-07-21 DIAGNOSIS — G9341 Metabolic encephalopathy: Secondary | ICD-10-CM | POA: Diagnosis not present

## 2022-07-22 DIAGNOSIS — R6521 Severe sepsis with septic shock: Secondary | ICD-10-CM | POA: Diagnosis not present

## 2022-07-22 DIAGNOSIS — J9601 Acute respiratory failure with hypoxia: Secondary | ICD-10-CM | POA: Diagnosis not present

## 2022-07-22 DIAGNOSIS — A419 Sepsis, unspecified organism: Secondary | ICD-10-CM | POA: Diagnosis not present

## 2022-07-22 DIAGNOSIS — R402431 Glasgow coma scale score 3-8, in the field [EMT or ambulance]: Secondary | ICD-10-CM | POA: Diagnosis not present

## 2022-07-23 DIAGNOSIS — G9341 Metabolic encephalopathy: Secondary | ICD-10-CM | POA: Diagnosis not present

## 2022-07-23 DIAGNOSIS — C719 Malignant neoplasm of brain, unspecified: Secondary | ICD-10-CM | POA: Diagnosis not present

## 2022-08-06 DEATH — deceased
